# Patient Record
Sex: Female | Born: 1980 | Race: White | Hispanic: No | Marital: Single | Smoking: Former smoker
Health system: Southern US, Community
[De-identification: ages and names within clinical notes are randomized; demographics above are authoritative.]

## PROBLEM LIST (undated history)

## (undated) DIAGNOSIS — E559 Vitamin D deficiency, unspecified: Secondary | ICD-10-CM

## (undated) DIAGNOSIS — F32A Depression, unspecified: Secondary | ICD-10-CM

## (undated) DIAGNOSIS — K625 Hemorrhage of anus and rectum: Secondary | ICD-10-CM

## (undated) DIAGNOSIS — T148XXA Other injury of unspecified body region, initial encounter: Secondary | ICD-10-CM

## (undated) DIAGNOSIS — M2550 Pain in unspecified joint: Secondary | ICD-10-CM

## (undated) DIAGNOSIS — B019 Varicella without complication: Secondary | ICD-10-CM

## (undated) DIAGNOSIS — K209 Esophagitis, unspecified: Secondary | ICD-10-CM

## (undated) DIAGNOSIS — F419 Anxiety disorder, unspecified: Secondary | ICD-10-CM

## (undated) DIAGNOSIS — N920 Excessive and frequent menstruation with regular cycle: Secondary | ICD-10-CM

## (undated) DIAGNOSIS — R296 Repeated falls: Secondary | ICD-10-CM

## (undated) DIAGNOSIS — R5383 Other fatigue: Secondary | ICD-10-CM

## (undated) DIAGNOSIS — R112 Nausea with vomiting, unspecified: Secondary | ICD-10-CM

## (undated) DIAGNOSIS — D649 Anemia, unspecified: Secondary | ICD-10-CM

## (undated) DIAGNOSIS — R51 Headache: Secondary | ICD-10-CM

## (undated) DIAGNOSIS — G43909 Migraine, unspecified, not intractable, without status migrainosus: Secondary | ICD-10-CM

## (undated) DIAGNOSIS — K219 Gastro-esophageal reflux disease without esophagitis: Secondary | ICD-10-CM

## (undated) DIAGNOSIS — R200 Anesthesia of skin: Secondary | ICD-10-CM

## (undated) DIAGNOSIS — Z91018 Allergy to other foods: Secondary | ICD-10-CM

## (undated) DIAGNOSIS — J302 Other seasonal allergic rhinitis: Secondary | ICD-10-CM

## (undated) DIAGNOSIS — F329 Major depressive disorder, single episode, unspecified: Secondary | ICD-10-CM

## (undated) DIAGNOSIS — N390 Urinary tract infection, site not specified: Secondary | ICD-10-CM

## (undated) HISTORY — DX: Esophagitis, unspecified: K20.9

## (undated) HISTORY — DX: Other seasonal allergic rhinitis: J30.2

## (undated) HISTORY — DX: Other injury of unspecified body region, initial encounter: T14.8XXA

## (undated) HISTORY — DX: Pain in unspecified joint: M25.50

## (undated) HISTORY — DX: Vitamin D deficiency, unspecified: E55.9

## (undated) HISTORY — DX: Varicella without complication: B01.9

## (undated) HISTORY — DX: Migraine, unspecified, not intractable, without status migrainosus: G43.909

## (undated) HISTORY — PX: FOOT SURGERY: SHX648

## (undated) HISTORY — DX: Repeated falls: R29.6

## (undated) HISTORY — DX: Headache: R51

## (undated) HISTORY — DX: Urinary tract infection, site not specified: N39.0

## (undated) HISTORY — DX: Nausea with vomiting, unspecified: R11.2

## (undated) HISTORY — DX: Allergy to other foods: Z91.018

## (undated) HISTORY — DX: Hemorrhage of anus and rectum: K62.5

## (undated) HISTORY — DX: Anxiety disorder, unspecified: F41.9

## (undated) HISTORY — DX: Gastro-esophageal reflux disease without esophagitis: K21.9

## (undated) HISTORY — DX: Anesthesia of skin: R20.0

## (undated) HISTORY — DX: Other fatigue: R53.83

---

## 2009-01-25 ENCOUNTER — Encounter: Payer: Self-pay | Admitting: Obstetrics & Gynecology

## 2009-05-17 ENCOUNTER — Encounter: Payer: Self-pay | Admitting: Obstetrics & Gynecology

## 2011-03-11 ENCOUNTER — Other Ambulatory Visit: Payer: Self-pay | Admitting: Family Medicine

## 2011-03-11 ENCOUNTER — Ambulatory Visit
Admission: RE | Admit: 2011-03-11 | Discharge: 2011-03-11 | Disposition: A | Discharge status: Home / Self Care | Payer: BC Managed Care – PPO | Source: Ambulatory Visit | Attending: Family Medicine | Admitting: Family Medicine

## 2011-03-11 DIAGNOSIS — R0981 Nasal congestion: Secondary | ICD-10-CM

## 2011-03-23 ENCOUNTER — Encounter (HOSPITAL_COMMUNITY): Payer: Self-pay | Admitting: Emergency Medicine

## 2011-03-23 ENCOUNTER — Other Ambulatory Visit: Payer: Self-pay

## 2011-03-23 ENCOUNTER — Emergency Department (INDEPENDENT_AMBULATORY_CARE_PROVIDER_SITE_OTHER)
Admission: EM | Admit: 2011-03-23 | Discharge: 2011-03-23 | Disposition: A | Discharge status: Home Health | Payer: BC Managed Care – PPO | Source: Home / Self Care | Attending: Emergency Medicine | Admitting: Emergency Medicine

## 2011-03-23 ENCOUNTER — Emergency Department (INDEPENDENT_AMBULATORY_CARE_PROVIDER_SITE_OTHER): Payer: BC Managed Care – PPO

## 2011-03-23 DIAGNOSIS — K219 Gastro-esophageal reflux disease without esophagitis: Secondary | ICD-10-CM

## 2011-03-23 MED ORDER — SUCRALFATE 1 GM/10ML PO SUSP: 1.0000 g | Freq: Four times a day (QID) | ORAL | Status: DC

## 2011-03-23 MED ORDER — GI COCKTAIL ~~LOC~~
ORAL | Status: AC
  Filled 2011-03-23: qty 30

## 2011-03-23 MED ORDER — GI COCKTAIL ~~LOC~~
30.0000 mL | Freq: Once | ORAL | Status: AC
  Administered 2011-03-23: 30 mL via ORAL

## 2011-03-23 MED ORDER — OMEPRAZOLE 40 MG PO CPDR: 40.0000 mg | DELAYED_RELEASE_CAPSULE | Freq: Two times a day (BID) | ORAL | Status: DC

## 2011-03-23 NOTE — ED Notes (Signed)
PT HERE WITH SUDDEN ONSET OF RIGHT CHEST STABBING,INTERMITT CP THAT STARTED X 3 DYS AGO WITH RADIATION TO R SHOULDER AND BACK.NO SOB OR DISTRESS NOTED.PT ALSO REPORTS OF X 3 EPISODES EVERY NIGHT OF CHEST BURNING WITH ACID REFLUX.NO CARDIAC HX.PT TRIED OTC ADVIL BUT NO RELIEF

## 2011-03-23 NOTE — ED Provider Notes (Signed)
Chief Complaint  Patient presents with  . Chest Pain  . Gastrophageal Reflux    History of Present Illness:   Betty Leonard is a 31 year old female who has had a three-day history of right anterior chest pain. This radiates through the back, down her arm, and into her right flank as well. The pain is worse if she lies down, particularly on her left side and when she lies on her left side she feels like she can't breathe. The pain is sometimes pleuritic. When this first came on she had some fever but none since then. He also over the past couple night she's had nocturnal awakenings with nausea, vomiting, and choking. The pain is burning in quality. It's fairly severe. She denies any coughing, wheezing, or shortness of breath other than as noted above. She denies any palpitations, irregular heartbeat, or dizziness. She's had no history of reflux, heart or lung problems in the past.  Review of Systems:  Other than noted above, the patient denies any of the following symptoms. Systemic:  No fever, chills, sweats, or fatigue. ENT:  No nasal congestion, rhinorrhea, or sore throat. Pulmonary:  No cough, wheezing, shortness of breath, sputum production, hemoptysis. Cardiac:  No palpitations, rapid heartbeat, dizziness, presyncope or syncope. GI:  No abdominal pain, heartburn, nausea, or vomiting. Skin:  No rash or itching. Ext:  No leg pain or swelling.   PMFSH:  Past medical history, family history, social history, meds, and allergies were reviewed and updated as needed.  Physical Exam:   Vital signs:  BP 114/69  Pulse 88  Temp(Src) 97.9 F (36.6 C) (Oral)  Resp 16  SpO2 99%  LMP 03/16/2011 Gen:  Alert, oriented, in no distress, skin warm and dry. Eye:  PERRL, lids and conjunctivas normal.  Sclera non-icteric. ENT:  Mucous membranes moist, pharynx clear. Neck:  Supple, no adenopathy or tenderness.  No JVD. Lungs:  Clear to auscultation, no wheezes, rales or rhonchi.  No respiratory distress. Heart:   Regular rhythm.  No gallops, murmers, clicks or rubs. Chest:  No chest wall tenderness. Abdomen:  Soft, nontender, no organomegaly or mass.  Bowel sounds normal.  No pulsatile abdominal mass or bruit. Ext:  No edema.  No calf tenderness and Homann's sign negative.  Pulses full and equal. Skin:  Warm and dry.  No rash.  Labs:   Results for orders placed during the hospital encounter of 03/23/11  POCT H PYLORI SCREEN      Component Value Range   H. PYLORI SCREEN, POC NEGATIVE  NEGATIVE      Radiology:  Dg Chest 2 View  03/23/2011  *RADIOLOGY REPORT*  Clinical Data: Fever for 3 days.  CHEST - 2 VIEW  Comparison: None.  Findings: Lungs are clear.  Heart size is normal.  No pneumothorax or effusion.  No focal bony abnormality.  IMPRESSION: Negative chest.  Original Report Authenticated By: Bernadene Bell. D'ALESSIO, M.D.    EKG:   Date: 03/23/2011  Rate: 74  Rhythm: normal sinus rhythm  QRS Axis: normal  Intervals: normal  ST/T Wave abnormalities: normal  Conduction Disutrbances:none  Narrative Interpretation: Normal sinus rhythm, normal EKG.  Old EKG Reviewed: none available   Medications given in [facility]:  She was given a dose a GI cocktail with slight relief in her pain.  Assessment:   Diagnoses that have been ruled out:  None  Diagnoses that are still under consideration:  None  Final diagnoses:  GERD (gastroesophageal reflux disease)    Plan:  1.  The following meds were prescribed:   New Prescriptions   OMEPRAZOLE (PRILOSEC) 40 MG CAPSULE    Take 1 capsule (40 mg total) by mouth 2 (two) times daily before a meal.   SUCRALFATE (CARAFATE) 1 GM/10ML SUSPENSION    Take 10 mLs (1 g total) by mouth 4 (four) times daily.   2.  The patient was instructed in symptomatic care and handouts were given. 3.  The patient was told to return if becoming worse in any way, if no better in 3 or 4 days, and given some red flag symptoms that would indicate earlier return.  Follow up:  The patient  was told to follow up with her primary care doctor in one week.     Roque Lias, MD 03/23/11 1059

## 2011-03-23 NOTE — Discharge Instructions (Signed)
Diet for GERD or PUD Nutrition therapy can help ease the discomfort of gastroesophageal reflux disease (GERD) and peptic ulcer disease (PUD).  HOME CARE INSTRUCTIONS   Eat your meals slowly, in a relaxed setting.   Eat 5 to 6 small meals per day.   If a food causes distress, stop eating it for a period of time.  FOODS TO AVOID  Coffee, regular or decaffeinated.   Cola beverages, regular or low calorie.   Tea, regular or decaffeinated.   Pepper.   Cocoa.   High fat foods, including meats.   Butter, margarine, hydrogenated oil (trans fats).   Peppermint or spearmint (if you have GERD).   Fruits and vegetables if not tolerated.   Alcohol.   Nicotine (smoking or chewing). This is one of the most potent stimulants to acid production in the gastrointestinal tract.   Any food that seems to aggravate your condition.  If you have questions regarding your diet, ask your caregiver or a registered dietitian. TIPS  Lying flat may make symptoms worse. Keep the head of your bed raised 6 to 9 inches (15 to 23 cm) by using a foam wedge or blocks under the legs of the bed.   Do not lay down until 3 hours after eating a meal.   Daily physical activity may help reduce symptoms.  MAKE SURE YOU:   Understand these instructions.   Will watch your condition.   Will get help right away if you are not doing well or get worse.  Document Released: 01/05/2005 Document Revised: 12/25/2010 Document Reviewed: 11/21/2010 [employer] Patient Information 2012 [employer]. 

## 2011-04-01 ENCOUNTER — Encounter (HOSPITAL_COMMUNITY): Payer: Self-pay | Admitting: *Deleted

## 2011-04-01 ENCOUNTER — Emergency Department (INDEPENDENT_AMBULATORY_CARE_PROVIDER_SITE_OTHER)
Admission: EM | Admit: 2011-04-01 | Discharge: 2011-04-01 | Disposition: A | Discharge status: Home / Self Care | Payer: BC Managed Care – PPO | Source: Home / Self Care | Attending: Family Medicine | Admitting: Family Medicine

## 2011-04-01 ENCOUNTER — Emergency Department (INDEPENDENT_AMBULATORY_CARE_PROVIDER_SITE_OTHER): Payer: BC Managed Care – PPO

## 2011-04-01 DIAGNOSIS — IMO0001 Reserved for inherently not codable concepts without codable children: Secondary | ICD-10-CM

## 2011-04-01 DIAGNOSIS — W319XXA Contact with unspecified machinery, initial encounter: Secondary | ICD-10-CM

## 2011-04-01 DIAGNOSIS — S6390XA Sprain of unspecified part of unspecified wrist and hand, initial encounter: Secondary | ICD-10-CM

## 2011-04-01 DIAGNOSIS — Y93G3 Activity, cooking and baking: Secondary | ICD-10-CM

## 2011-04-01 MED ORDER — IBUPROFEN 600 MG PO TABS: 600.0000 mg | ORAL_TABLET | Freq: Three times a day (TID) | ORAL | Status: AC | PRN

## 2011-04-01 NOTE — Discharge Instructions (Signed)
There are no fractures or bone injury signs and your x-ray. Take anti-inflammatory medication prescribed as instructed. Keep the splint in place on until pain and swelling are completely resolved, removed the splint at least 3 times a day for rehabilitation exercises squeezing a rubber ball once pain is improved. Followup with a hand specialist if worsening symptoms like increased swelling pain or difficulty with movement.   Finger Sprain A finger sprain is a tear in one of the strong, fibrous tissues that connect the bones (ligaments) in your finger. The severity of the sprain depends on how much of the ligament is torn. The tear can be either partial or complete. CAUSES  Often, sprains are a result of a fall or accident. If you extend your hands to catch an object or to protect yourself, the force of the impact causes the fibers of your ligament to stretch too much. This excess tension causes the fibers of your ligament to tear. SYMPTOMS  You may have some loss of motion in your finger. Other symptoms include:  Bruising.   Tenderness.   Swelling.  DIAGNOSIS  In order to diagnose finger sprain, your caregiver will physically examine your finger or thumb to determine how torn the ligament is. Your caregiver may also suggest an X-ray exam of your finger to make sure no bones are broken. TREATMENT  If your ligament is only partially torn, treatment usually involves keeping the finger in a fixed position (immobilization) for a short period. To do this, your caregiver will apply a bandage, cast, or splint to keep your finger from moving until it heals. For a partially torn ligament, the healing process usually takes 2 to 3 weeks. If your ligament is completely torn, you may need surgery to reconnect the ligament to the bone. After surgery a cast or splint will be applied and will need to stay on your finger or thumb for 4 to 6 weeks while your ligament heals. HOME CARE INSTRUCTIONS  Keep your  injured finger elevated, when possible, to decrease swelling.   To ease pain and swelling, apply ice to your joint twice a day, for 2 to 3 days:   Put ice in a plastic bag.   Place a towel between your skin and the bag.   Leave the ice on for 15 minutes.   Only take over-the-counter or prescription medicine for pain as directed by your caregiver.   Do not wear rings on your injured finger.   Do not leave your finger unprotected until pain and stiffness go away (usually 3 to 4 weeks).   Do not allow your cast or splint to get wet. Cover your cast or splint with a plastic bag when you shower or bathe. Do not swim.   Your caregiver may suggest special exercises for you to do during your recovery to prevent or limit permanent stiffness.  SEEK IMMEDIATE MEDICAL CARE IF:  Your cast or splint becomes damaged.   Your pain becomes worse rather than better.  MAKE SURE YOU:  Understand these instructions.   Will watch your condition.   Will get help right away if you are not doing well or get worse.  Document Released: 02/13/2004 Document Revised: 12/25/2010 Document Reviewed: 09/08/2010 [facility] Patient Information 2012 [place], [place].

## 2011-04-01 NOTE — ED Provider Notes (Signed)
History     CSN: [id number]  Arrival date & time 04/01/11  1753   First MD Initiated Contact with Patient 04/01/11 1827      Chief Complaint  Patient presents with  . Hand Pain    (Consider location/radiation/quality/duration/timing/severity/associated sxs/prior treatment) HPI Comments: 31 y/o left handed female here c/o pain and swelling of right hand specially of the 4th digit after her hand got caught in a food processor while mixing a cake. No lacerations, also reports diffused soreness in forearm. Denies pain in wrist.    History reviewed. No pertinent past medical history.  Past Surgical History  Procedure Date  . Foot surgery     History reviewed. No pertinent family history.  History  Substance Use Topics  . Smoking status: Current Everyday Smoker  . Smokeless tobacco: Not on file  . Alcohol Use: Yes    OB History    Grav Para Term Preterm Abortions TAB SAB Ect Mult Living                  Review of Systems  All other systems reviewed and are negative.    Allergies  Aspirin and Codeine  Home Medications   Current Outpatient Rx  Name Route Sig Dispense Refill  . AMITRIPTYLINE HCL 10 MG PO TABS Oral Take 10 mg by mouth at bedtime.    Marland Kitchen CITALOPRAM HYDROBROMIDE 40 MG PO TABS Oral Take 40 mg by mouth daily.    . IBUPROFEN 600 MG PO TABS Oral Take 1 tablet (600 mg total) by mouth every 8 (eight) hours as needed for pain. 30 tablet 0  . LEVONORGEST-ETH ESTRAD 91-DAY 0.15-0.03 MG PO TABS Oral Take 1 tablet by mouth daily.    Marland Kitchen OMEPRAZOLE 40 MG PO CPDR Oral Take 1 capsule (40 mg total) by mouth 2 (two) times daily before a meal. 30 capsule 0  . SUCRALFATE 1 GM/10ML PO SUSP Oral Take 10 mLs (1 g total) by mouth 4 (four) times daily. 420 mL 0    BP 118/70  Pulse 78  Temp(Src) 98.6 F (37 C) (Oral)  Resp 18  SpO2 100%  LMP 04/01/2011  Physical Exam  Nursing note and vitals reviewed. Constitutional: She is oriented to person, place, and time. She  appears well-developed and well-nourished. No distress.  HENT:  Head: Normocephalic and atraumatic.  Cardiovascular: Normal heart sounds.   Pulmonary/Chest: Breath sounds normal.  Musculoskeletal: She exhibits edema and tenderness.       Right hand: mild to moderate swelling and tenderness over dorsum of right 4th PIPJ. Patient able to partially flex and extend this joint with limitation due to pain and swelling. Able to flex and extend distal IPJ. Intact sensation of the entire right hand including 4th digit. No skin discoloration, bruising or hematomas.  Right wrist: FROM normal exam. Right fore arm diffused tenderness medially worse with active supination otherwise normal exam.  Neurological: She is alert and oriented to person, place, and time. She has normal reflexes.    ED Course  Procedures (including critical care time)  Labs Reviewed - No data to display Dg Hand Complete Right  04/01/2011  *RADIOLOGY REPORT*  Clinical Data: hand stuck in blender  RIGHT HAND - COMPLETE 3+ VIEW  Comparison: None.  Findings: Negative for fracture, dislocation, or other acute abnormality.  Normal alignment and mineralization. No significant degenerative change.  Regional soft tissues unremarkable.  IMPRESSION:  Negative  Original Report Authenticated By: Osa Craver, M.D.  1. Sprain of fourth finger of right hand       MDM  Possible mild misalignment noted on X-rays at level of right 4th PIPJ not described by radiologist, but impress mainly at the expense of soft tissue as articular faces look well confronted. Decided to place on digit splint. Asked to remove 3times a day for rehab exercises and return or follow up with hand specialist if persistent pain or swelling after 5-7 days or earlier if any worsening of her symptoms.         Sharin Grave, MD 04/02/11 1318

## 2011-04-01 NOTE — ED Notes (Signed)
Pt  Reports  She  Put her  r  Hand  In a  Mixer  Today    And  inj  Her  Hand she  Has  Pain on palpation

## 2011-09-23 ENCOUNTER — Emergency Department (HOSPITAL_COMMUNITY)
Admission: EM | Admit: 2011-09-23 | Discharge: 2011-09-23 | Disposition: A | Discharge status: Home / Self Care | Payer: BC Managed Care – PPO | Source: Home / Self Care | Attending: Emergency Medicine | Admitting: Emergency Medicine

## 2011-09-23 ENCOUNTER — Encounter (HOSPITAL_COMMUNITY): Payer: Self-pay | Admitting: *Deleted

## 2011-09-23 ENCOUNTER — Emergency Department (INDEPENDENT_AMBULATORY_CARE_PROVIDER_SITE_OTHER): Payer: BC Managed Care – PPO

## 2011-09-23 DIAGNOSIS — M7660 Achilles tendinitis, unspecified leg: Secondary | ICD-10-CM

## 2011-09-23 NOTE — ED Provider Notes (Signed)
History     CSN: [id number]  Arrival date & time 09/23/11  4098   First MD Initiated Contact with Patient 09/23/11 1839      Chief Complaint  Patient presents with  . Ankle Pain    (Consider location/radiation/quality/duration/timing/severity/associated sxs/prior treatment) HPI Comments: Pain below R medial malleolus and at achilles and lower calf area ~ 1 week.  Does not recall any injury.  Hurts to dorsiflex foot and to stand on tip toes.  The history is provided by the patient. No language interpreter was used.    History reviewed. No pertinent past medical history.  Past Surgical History  Procedure Date  . Foot surgery     History reviewed. No pertinent family history.  History  Substance Use Topics  . Smoking status: Current Everyday Smoker  . Smokeless tobacco: Not on file  . Alcohol Use: Yes    OB History    Grav Para Term Preterm Abortions TAB SAB Ect Mult Living                  Review of Systems  Musculoskeletal:       R ankle pain  All other systems reviewed and are negative.    Allergies  Aspirin and Codeine  Home Medications   Current Outpatient Rx  Name Route Sig Dispense Refill  . AMITRIPTYLINE HCL 10 MG PO TABS Oral Take 10 mg by mouth at bedtime.    Marland Kitchen CITALOPRAM HYDROBROMIDE 40 MG PO TABS Oral Take 40 mg by mouth daily.    Marland Kitchen LEVONORGEST-ETH ESTRAD 91-DAY 0.15-0.03 MG PO TABS Oral Take 1 tablet by mouth daily.    Marland Kitchen OMEPRAZOLE 40 MG PO CPDR Oral Take 1 capsule (40 mg total) by mouth 2 (two) times daily before a meal. 30 capsule 0  . SUCRALFATE 1 GM/10ML PO SUSP Oral Take 10 mLs (1 g total) by mouth 4 (four) times daily. 420 mL 0    BP 122/68  Pulse 72  Temp 98.6 F (37 C)  Resp 14  SpO2 100%  LMP 09/03/2011  Physical Exam  ED Course  Procedures (including critical care time)  Labs Reviewed - No data to display Dg Ankle Complete Right  09/23/2011  *RADIOLOGY REPORT*  Clinical Data: Pain  RIGHT ANKLE - COMPLETE 3+ VIEW   Comparison: None.  Findings: Three-view exam of the right ankle shows no evidence for fracture.  No subluxation or dislocation.  No worrisome lytic or sclerotic osseous abnormality.  Surgical hardware is seen in the base of the first metatarsal and in the distal second and third metatarsals although these areas are incompletely visualized.  IMPRESSION: No acute bony findings in the ankle.   Original Report Authenticated By: ERIC A. MANSELL, M.D.      1. Achilles tendonitis       MDM  ASO splint x 2-3 weeks. Ice F/u with dr. Luiz Blare prn         Evalina Field, PA 09/23/11 2106

## 2011-09-23 NOTE — ED Notes (Signed)
Medium  aso  Applied

## 2011-09-23 NOTE — ED Notes (Signed)
pT  REPORTS     SHE  HAS  PAIN R  ANKLE AREA  FOR  WHICH  SHE  REPORTS  SHE  NOTICED   ABOUT  1  WEEK  AGO  SHE  DENYS  ANY  SPECEFIC    INJURY  SHE  AMBULATED     TO  ROOM  WITH A  SLOW  STEADY  GAIT       NO  OBVIOUS  DEFORMITY  NOTED          SHE  REPORTS  SHE  HAS  BEEN  APPLYING       ICE  TO THE  AREA  LOCALLY

## 2011-09-23 NOTE — ED Provider Notes (Signed)
Medical screening examination/treatment/procedure(s) were performed by non-physician practitioner and as supervising physician I was immediately available for consultation/collaboration.  Raynald Blend, MD 09/23/11 2117

## 2011-10-22 ENCOUNTER — Other Ambulatory Visit: Payer: Self-pay | Admitting: Sports Medicine

## 2011-10-22 DIAGNOSIS — M25571 Pain in right ankle and joints of right foot: Secondary | ICD-10-CM

## 2011-10-25 ENCOUNTER — Ambulatory Visit
Admission: RE | Admit: 2011-10-25 | Discharge: 2011-10-25 | Disposition: A | Discharge status: Home / Self Care | Payer: BC Managed Care – PPO | Source: Ambulatory Visit | Attending: Sports Medicine | Admitting: Sports Medicine

## 2011-10-25 DIAGNOSIS — M25571 Pain in right ankle and joints of right foot: Secondary | ICD-10-CM

## 2011-12-16 ENCOUNTER — Encounter: Payer: Self-pay | Admitting: Gastroenterology

## 2011-12-16 ENCOUNTER — Ambulatory Visit (INDEPENDENT_AMBULATORY_CARE_PROVIDER_SITE_OTHER): Payer: BC Managed Care – PPO | Admitting: Gastroenterology

## 2011-12-16 VITALS — BP 120/64 | HR 96 | Ht 66.0 in | Wt 216.0 lb

## 2011-12-16 DIAGNOSIS — K625 Hemorrhage of anus and rectum: Secondary | ICD-10-CM

## 2011-12-16 DIAGNOSIS — R112 Nausea with vomiting, unspecified: Secondary | ICD-10-CM

## 2011-12-16 NOTE — Progress Notes (Signed)
History of Present Illness:  31 year-old white female referred at the request of Dr. Fulp for evaluation of nausea and vomiting.  For the past 5 days she's had persistent nausea and immediate postprandial vomiting. Nausea tends to occur throughout the day. She is without fever, myalgias or change in bowel habits. It is similar episode about a year ago and she was placed on Prilosec. She's taking 80 mg twice a day. On 2 occasions she seen a minimal amount of blood streaking her stools. Stools have been solid.  She denies urinary frequency or dysuria.    Past Medical History  Diagnosis Date  . GERD (gastroesophageal reflux disease)   . Migraines    Past Surgical History  Procedure Date  . Foot surgery    family history includes Leukemia in her paternal grandfather and Lung cancer in her paternal grandfather.  There is no history of Colon cancer. Current Outpatient Prescriptions  Medication Sig Dispense Refill  . baclofen (LIORESAL) 10 MG tablet Takes as directed      . citalopram (CELEXA) 40 MG tablet Take 40 mg by mouth daily.      . omeprazole (PRILOSEC) 40 MG capsule Take by mouth. Two capsules twice a day      . topiramate (TOPAMAX) 25 MG tablet Takes as directed      . [DISCONTINUED] omeprazole (PRILOSEC) 40 MG capsule Take 1 capsule (40 mg total) by mouth 2 (two) times daily before a meal.  30 capsule  0   Allergies as of 12/16/2011 - Review Complete 12/16/2011  Allergen Reaction Noted  . Aspirin  03/23/2011  . Codeine  03/23/2011    reports that she has been smoking.  She has never used smokeless tobacco. She reports that she drinks alcohol. She reports that she does not use illicit drugs.     Review of Systems: Pertinent positive and negative review of systems were noted in the above HPI section. All other review of systems were otherwise negative.  Vital signs were reviewed in today's medical record Physical Exam: General: Well developed , well nourished, no acute  distress Head: Normocephalic and atraumatic Eyes:  sclerae anicteric, EOMI Ears: Normal auditory acuity Mouth: No deformity or lesions Neck: Supple, no masses or thyromegaly Lungs: Clear throughout to auscultation Heart: Regular rate and rhythm; no murmurs, rubs or bruits Abdomen: Soft, non tender and non distended. No masses, hepatosplenomegaly or hernias noted. Normal Bowel sounds. There is no succussion splash.  There is minimal right CVA tenderness. Rectal:deferred Musculoskeletal: Symmetrical with no gross deformities  Skin: No lesions on visible extremities Pulses:  Normal pulses noted Extremities: No clubbing, cyanosis, edema or deformities noted Neurological: Alert oriented x 4, grossly nonfocal Cervical Nodes:  No significant cervical adenopathy Inguinal Nodes: No significant inguinal adenopathy Psychological:  Alert and cooperative. Normal mood and affect       

## 2011-12-16 NOTE — Assessment & Plan Note (Signed)
Very minimal rectal bleeding is probably hemorrhoidal. Plan no further workup unless symptoms recur at which point I would consider sigmoidoscopy or colonoscopy.

## 2011-12-16 NOTE — Assessment & Plan Note (Addendum)
Symptoms may be due to high-dose PPI therapy.  This would be a prolonged episode were it gastroenteritis. Gastroparesis and active peptic ulcer disease are less likely possibilities.  Recommendations #1 discontinue Prilosec. Patient was carefully instructed to contact me if symptoms persist after 4-5 days at which point I would consider upper endoscopy

## 2011-12-16 NOTE — Patient Instructions (Addendum)
Discontinue Prilosec Call back in 5 days if no better

## 2011-12-21 ENCOUNTER — Other Ambulatory Visit: Payer: Self-pay | Admitting: Gastroenterology

## 2011-12-21 ENCOUNTER — Telehealth: Payer: Self-pay | Admitting: Gastroenterology

## 2011-12-21 DIAGNOSIS — R112 Nausea with vomiting, unspecified: Secondary | ICD-10-CM

## 2011-12-21 NOTE — Telephone Encounter (Signed)
ok 

## 2011-12-21 NOTE — Telephone Encounter (Signed)
Pt states she is not feeling any better, she is still vomiting. Should pt be scheduled for EGD as per OV note? Please advise.

## 2011-12-21 NOTE — Telephone Encounter (Signed)
Schedule EGD

## 2011-12-21 NOTE — Telephone Encounter (Signed)
Pt scheduled for EGD at [facility] 12/24/11 @12 :30pm. Pt to arrive at 11:30am. Pt aware of appt date and time. Prep instructions sent to pt.

## 2011-12-24 ENCOUNTER — Encounter (HOSPITAL_COMMUNITY)
Admission: RE | Disposition: A | Discharge status: Home / Self Care | Payer: Self-pay | Source: Ambulatory Visit | Attending: Gastroenterology

## 2011-12-24 ENCOUNTER — Ambulatory Visit (HOSPITAL_COMMUNITY)
Admission: RE | Admit: 2011-12-24 | Discharge: 2011-12-24 | Disposition: A | Discharge status: Home / Self Care | Payer: BC Managed Care – PPO | Source: Ambulatory Visit | Attending: Gastroenterology | Admitting: Gastroenterology

## 2011-12-24 ENCOUNTER — Encounter (HOSPITAL_COMMUNITY): Payer: Self-pay | Admitting: *Deleted

## 2011-12-24 DIAGNOSIS — K209 Esophagitis, unspecified without bleeding: Secondary | ICD-10-CM

## 2011-12-24 DIAGNOSIS — R112 Nausea with vomiting, unspecified: Secondary | ICD-10-CM | POA: Insufficient documentation

## 2011-12-24 DIAGNOSIS — D131 Benign neoplasm of stomach: Secondary | ICD-10-CM | POA: Insufficient documentation

## 2011-12-24 HISTORY — PX: ESOPHAGOGASTRODUODENOSCOPY: SHX5428

## 2011-12-24 HISTORY — DX: Esophagitis, unspecified without bleeding: K20.90

## 2011-12-24 HISTORY — DX: Major depressive disorder, single episode, unspecified: F32.9

## 2011-12-24 HISTORY — DX: Depression, unspecified: F32.A

## 2011-12-24 SURGERY — EGD (ESOPHAGOGASTRODUODENOSCOPY)
Anesthesia: Moderate Sedation

## 2011-12-24 MED ORDER — FENTANYL CITRATE 0.05 MG/ML IJ SOLN
INTRAMUSCULAR | Status: DC | PRN
  Administered 2011-12-24 (×4): 25 ug via INTRAVENOUS

## 2011-12-24 MED ORDER — GLYCOPYRROLATE 0.2 MG/ML IJ SOLN
INTRAMUSCULAR | Status: DC | PRN
  Administered 2011-12-24: 0.2 mg via INTRAVENOUS

## 2011-12-24 MED ORDER — SODIUM CHLORIDE 0.9 % IV SOLN
INTRAVENOUS | Status: DC
  Administered 2011-12-24: 500 mL via INTRAVENOUS

## 2011-12-24 MED ORDER — GLYCOPYRROLATE 0.2 MG/ML IJ SOLN
INTRAMUSCULAR | Status: AC
  Filled 2011-12-24: qty 1

## 2011-12-24 MED ORDER — FENTANYL CITRATE 0.05 MG/ML IJ SOLN
INTRAMUSCULAR | Status: AC
  Filled 2011-12-24: qty 4

## 2011-12-24 MED ORDER — DIPHENHYDRAMINE HCL 50 MG/ML IJ SOLN
INTRAMUSCULAR | Status: AC
  Filled 2011-12-24: qty 1

## 2011-12-24 MED ORDER — DIPHENHYDRAMINE HCL 50 MG/ML IJ SOLN
INTRAMUSCULAR | Status: DC | PRN
  Administered 2011-12-24: 25 mg via INTRAVENOUS

## 2011-12-24 MED ORDER — MIDAZOLAM HCL 10 MG/2ML IJ SOLN
INTRAMUSCULAR | Status: DC | PRN
  Administered 2011-12-24 (×4): 2.5 mg via INTRAVENOUS

## 2011-12-24 MED ORDER — MIDAZOLAM HCL 10 MG/2ML IJ SOLN
INTRAMUSCULAR | Status: AC
  Filled 2011-12-24: qty 4

## 2011-12-24 NOTE — Interval H&P Note (Signed)
History and Physical Interval Note:  12/24/2011 12:19 PM  Betty Leonard  has presented today for surgery, with the diagnosis of Nausea & vomiting [787.01]  The various methods of treatment have been discussed with the patient and family. After consideration of risks, benefits and other options for treatment, the patient has consented to  Procedure(s) (LRB) with comments: ESOPHAGOGASTRODUODENOSCOPY (EGD) (N/A) as a surgical intervention .  The patient's history has been reviewed, patient examined, no change in status, stable for surgery.  I have reviewed the patient's chart and labs.  Questions were answered to the patient's satisfaction.    The recent H&P (dated *12/16/11**) was reviewed, the patient was examined and there is no change in the patients condition since that H&P was completed.   Melvia Heaps  12/24/2011, 12:19 PM    Melvia Heaps

## 2011-12-24 NOTE — H&P (View-Only) (Signed)
History of Present Illness:  31 year-old white female referred at the request of Dr. Jillyn Hidden for evaluation of nausea and vomiting.  For the past 5 days she's had persistent nausea and immediate postprandial vomiting. Nausea tends to occur throughout the day. She is without fever, myalgias or change in bowel habits. It is similar episode about a year ago and she was placed on Prilosec. She's taking 80 mg twice a day. On 2 occasions she seen a minimal amount of blood streaking her stools. Stools have been solid.  She denies urinary frequency or dysuria.    Past Medical History  Diagnosis Date  . GERD (gastroesophageal reflux disease)   . Migraines    Past Surgical History  Procedure Date  . Foot surgery    family history includes Leukemia in her paternal grandfather and Lung cancer in her paternal grandfather.  There is no history of Colon cancer. Current Outpatient Prescriptions  Medication Sig Dispense Refill  . baclofen (LIORESAL) 10 MG tablet Takes as directed      . citalopram (CELEXA) 40 MG tablet Take 40 mg by mouth daily.      Marland Kitchen omeprazole (PRILOSEC) 40 MG capsule Take by mouth. Two capsules twice a day      . topiramate (TOPAMAX) 25 MG tablet Takes as directed      . [DISCONTINUED] omeprazole (PRILOSEC) 40 MG capsule Take 1 capsule (40 mg total) by mouth 2 (two) times daily before a meal.  30 capsule  0   Allergies as of 12/16/2011 - Review Complete 12/16/2011  Allergen Reaction Noted  . Aspirin  03/23/2011  . Codeine  03/23/2011    reports that she has been smoking.  She has never used smokeless tobacco. She reports that she drinks alcohol. She reports that she does not use illicit drugs.     Review of Systems: Pertinent positive and negative review of systems were noted in the above HPI section. All other review of systems were otherwise negative.  Vital signs were reviewed in today's medical record Physical Exam: General: Well developed , well nourished, no acute  distress Head: Normocephalic and atraumatic Eyes:  sclerae anicteric, EOMI Ears: Normal auditory acuity Mouth: No deformity or lesions Neck: Supple, no masses or thyromegaly Lungs: Clear throughout to auscultation Heart: Regular rate and rhythm; no murmurs, rubs or bruits Abdomen: Soft, non tender and non distended. No masses, hepatosplenomegaly or hernias noted. Normal Bowel sounds. There is no succussion splash.  There is minimal right CVA tenderness. Rectal:deferred Musculoskeletal: Symmetrical with no gross deformities  Skin: No lesions on visible extremities Pulses:  Normal pulses noted Extremities: No clubbing, cyanosis, edema or deformities noted Neurological: Alert oriented x 4, grossly nonfocal Cervical Nodes:  No significant cervical adenopathy Inguinal Nodes: No significant inguinal adenopathy Psychological:  Alert and cooperative. Normal mood and affect

## 2011-12-24 NOTE — Op Note (Signed)
[facility] [address] [place], [address]   ENDOSCOPY PROCEDURE REPORT  PATIENT: Betty Leonard, Betty Leonard  MR#: [id number] BIRTHDATE: 09-22-1980 , 31  yrs. old GENDER: Female ENDOSCOPIST: Louis Meckel, MD REFERRED BY: PROCEDURE DATE:  12/24/2011 PROCEDURE:  EGD, diagnostic ASA CLASS:     Class I INDICATIONS:  Nausea.   Vomiting. MEDICATIONS: These medications were titrated to patient response per physician's verbal order, Versed 10 mg IV, Fentanyl 100 mcg IV, Benadryl 25 mg IV, and Robinul 0.2 mg IV TOPICAL ANESTHETIC: Lidocaine Spray  DESCRIPTION OF PROCEDURE: After the risks benefits and alternatives of the procedure were thoroughly explained, informed consent was obtained.  The Pentax Gastroscope [id number] endoscope was introduced through the mouth and advanced to the   . Without limitations.  The instrument was slowly withdrawn as the mucosa was fully examined.      There were a few linear erosions at the GE junction.  There were 3 1-2 mm fundic gland polyps in the gastric fundus characterized by normal overlying mucosa located in the gastric fundus. The remainder of the upper endoscopy exam was otherwise normal. Retroflexed views revealed no abnormalities.     The scope was then withdrawn from the patient and the procedure completed.  COMPLICATIONS: There were no complications. ENDOSCOPIC IMPRESSION: 1.  erosive esophagitis 2.  gastric polyps   RECOMMENDATIONS: gastric emptying scan continue PPI therapy REPEAT EXAM:  eSigned:  Louis Meckel, MD 12/24/2011 12:50 PM   CC:

## 2011-12-24 NOTE — Discharge Instructions (Signed)
My office will contact you to schedule gastric emptying scan.  Gastrointestinal Endoscopy Care After Refer to this sheet in the next few weeks. These instructions provide you with information on caring for yourself after your procedure. Your caregiver may also give you more specific instructions. Your treatment has been planned according to current medical practices, but problems sometimes occur. Call your caregiver if you have any problems or questions after your procedure. HOME CARE INSTRUCTIONS  If you were given medicine to help you relax (sedative), do not drive, operate machinery, or sign important documents for 24 hours.  Avoid alcohol and hot or warm beverages for the first 24 hours after the procedure.  Only take over-the-counter or prescription medicines for pain, discomfort, or fever as directed by your caregiver. You may resume taking your normal medicines unless your caregiver tells you otherwise. Ask your caregiver when you may resume taking medicines that may cause bleeding, such as aspirin, clopidogrel, or warfarin.  You may return to your normal diet and activities on the day after your procedure, or as directed by your caregiver. Walking may help to reduce any bloated feeling in your abdomen.  Drink enough fluids to keep your urine clear or pale yellow.  You may gargle with salt water if you have a sore throat. SEEK IMMEDIATE MEDICAL CARE IF:  You have severe nausea or vomiting.  You have severe abdominal pain, abdominal cramps that last longer than 6 hours, or abdominal swelling (distention).  You have severe shoulder or back pain.  You have trouble swallowing.  You have shortness of breath, your breathing is shallow, or you are breathing faster than normal.  You have a fever or a rapid heartbeat.  You vomit blood or material that looks like coffee grounds.  You have bloody, black, or tarry stools. MAKE SURE YOU:  Understand these instructions.  Will watch  your condition.  Will get help right away if you are not doing well or get worse. Document Released: 08/20/2003 Document Revised: 07/07/2011 Document Reviewed: 04/07/2011 [facility] Patient Information 2013 [place], [place].

## 2011-12-25 ENCOUNTER — Encounter (HOSPITAL_COMMUNITY): Payer: Self-pay | Admitting: Gastroenterology

## 2011-12-25 ENCOUNTER — Telehealth: Payer: Self-pay

## 2011-12-25 ENCOUNTER — Other Ambulatory Visit: Payer: Self-pay | Admitting: Gastroenterology

## 2011-12-25 DIAGNOSIS — R109 Unspecified abdominal pain: Secondary | ICD-10-CM

## 2011-12-25 NOTE — Telephone Encounter (Signed)
Pt scheduled for GES at [facility] 01/25/12 pt to arrive at 7:45am for an 8am appt. Pt to be NPO after midnight. Pt states this day is not good for her. Pt given the number [phone] to call and reschedule the appt to a date that works for her. Pt verbalized understanding.

## 2011-12-31 ENCOUNTER — Telehealth: Payer: Self-pay | Admitting: Gastroenterology

## 2011-12-31 NOTE — Telephone Encounter (Signed)
Pt c/o very sharp pain that comes and goes on her back side around the kidney area. Discussed with pt that is sounds like she may have a kidney stone. Advised pt to call her PCP for this, she verbalized understanding.

## 2012-01-06 ENCOUNTER — Encounter (HOSPITAL_COMMUNITY)
Admission: RE | Admit: 2012-01-06 | Discharge: 2012-01-06 | Disposition: A | Discharge status: Home / Self Care | Payer: BC Managed Care – PPO | Source: Ambulatory Visit | Attending: Gastroenterology | Admitting: Gastroenterology

## 2012-01-06 DIAGNOSIS — R109 Unspecified abdominal pain: Secondary | ICD-10-CM

## 2012-01-06 MED ORDER — TECHNETIUM TC 99M SULFUR COLLOID
2.0000 | Freq: Once | INTRAVENOUS | Status: AC | PRN
  Administered 2012-01-06: 2 via INTRAVENOUS

## 2012-01-07 ENCOUNTER — Telehealth: Payer: Self-pay | Admitting: Gastroenterology

## 2012-01-07 NOTE — Telephone Encounter (Signed)
Pt called wanting to know if she needs to schedule a F/U OV? Please advise.

## 2012-01-11 NOTE — Telephone Encounter (Signed)
Pt scheduled for an OV with Dr. Arlyce Dice and she is aware.

## 2012-01-11 NOTE — Telephone Encounter (Signed)
Left message for pt to call back  °

## 2012-01-11 NOTE — Telephone Encounter (Signed)
yes

## 2012-01-25 ENCOUNTER — Other Ambulatory Visit (HOSPITAL_COMMUNITY): Payer: BC Managed Care – PPO

## 2012-01-27 ENCOUNTER — Ambulatory Visit (INDEPENDENT_AMBULATORY_CARE_PROVIDER_SITE_OTHER): Payer: BC Managed Care – PPO | Admitting: Gastroenterology

## 2012-01-27 ENCOUNTER — Encounter: Payer: Self-pay | Admitting: Gastroenterology

## 2012-01-27 VITALS — BP 110/68 | HR 84 | Ht 66.0 in | Wt 216.2 lb

## 2012-01-27 DIAGNOSIS — R112 Nausea with vomiting, unspecified: Secondary | ICD-10-CM

## 2012-01-27 MED ORDER — METOCLOPRAMIDE HCL 10 MG PO TABS: ORAL_TABLET | ORAL | Status: DC

## 2012-01-27 NOTE — Patient Instructions (Addendum)
We are sending in a prescription to your pharmacy Call back in one week to report your progress

## 2012-01-27 NOTE — Progress Notes (Signed)
History of Present Illness:  Betty Leonard has returned for followup of nausea and vomiting. She continues to complain of postprandial vomiting. Within 30 minutes of eating she will develop a fullness with the sudden urge to vomit. She denies severe nausea, per se. If she limits her diet to liquids and coffee this does not occur. She denies abdominal pain. Weight has been stable. Upper endoscopy demonstrated mild nonerosive esophagitis. Gastric emptying scan was normal. She denies change of bowel habits. She's been on a stable regimen of medications for over a year. She has no antecedent GI complaints. There's no history of abdominal surgery.    Review of Systems: Pertinent positive and negative review of systems were noted in the above HPI section. All other review of systems were otherwise negative.    Current Medications, Allergies, Past Medical History, Past Surgical History, Family History and Social History were reviewed in [employer] [facility] electronic medical record  Vital signs were reviewed in today's medical record. Physical Exam: General: Well developed , well nourished, no acute distress Abdomen is without masses, tenderness organomegaly. There is no succussion splash

## 2012-01-27 NOTE — Assessment & Plan Note (Signed)
She continues with postprandial vomiting. She is not nauseated so much as she has the sudden onset of fullness that eventuates with vomiting. Despite her symptoms weight has been stable.  Medications  long preceded these symptoms. Functional complaints is a consideration. Doubt chronic cholecystitis. Intestinal obstruction is also less likely.  Recommendations #1 trial of Reglan 10 mg one of our a.c. and at bedtime  Instructed patient to  contact me immediately  if she develops any side effects from her Reglan including paresthesias, tremors, confusion , weakness or muscle spasms.  It is noted that metoclopramide may increase the risk of serotonin syndrome.  Patient was instructed to call back in one week to report her progress. If not improved would consider an upper GI series

## 2012-03-05 ENCOUNTER — Other Ambulatory Visit: Payer: Self-pay

## 2012-07-28 ENCOUNTER — Ambulatory Visit (INDEPENDENT_AMBULATORY_CARE_PROVIDER_SITE_OTHER): Payer: BC Managed Care – PPO | Admitting: Family Medicine

## 2012-07-28 ENCOUNTER — Telehealth: Payer: Self-pay | Admitting: Family Medicine

## 2012-07-28 ENCOUNTER — Encounter: Payer: Self-pay | Admitting: Family Medicine

## 2012-07-28 VITALS — BP 130/80 | Temp 97.9°F | Ht 66.0 in | Wt 212.0 lb

## 2012-07-28 DIAGNOSIS — Z7689 Persons encountering health services in other specified circumstances: Secondary | ICD-10-CM

## 2012-07-28 DIAGNOSIS — M722 Plantar fascial fibromatosis: Secondary | ICD-10-CM

## 2012-07-28 DIAGNOSIS — F341 Dysthymic disorder: Secondary | ICD-10-CM

## 2012-07-28 DIAGNOSIS — M76829 Posterior tibial tendinitis, unspecified leg: Secondary | ICD-10-CM

## 2012-07-28 DIAGNOSIS — F419 Anxiety disorder, unspecified: Secondary | ICD-10-CM

## 2012-07-28 DIAGNOSIS — F17200 Nicotine dependence, unspecified, uncomplicated: Secondary | ICD-10-CM

## 2012-07-28 DIAGNOSIS — Z7189 Other specified counseling: Secondary | ICD-10-CM

## 2012-07-28 DIAGNOSIS — G43909 Migraine, unspecified, not intractable, without status migrainosus: Secondary | ICD-10-CM

## 2012-07-28 DIAGNOSIS — M76821 Posterior tibial tendinitis, right leg: Secondary | ICD-10-CM

## 2012-07-28 LAB — BASIC METABOLIC PANEL
BUN: 14 mg/dL (ref 6–23)
CO2: 30 mEq/L (ref 19–32)
Calcium: 9.3 mg/dL (ref 8.4–10.5)
Creatinine, Ser: 0.7 mg/dL (ref 0.4–1.2)
Glucose, Bld: 86 mg/dL (ref 70–99)

## 2012-07-28 LAB — HEMOGLOBIN A1C: Hgb A1c MFr Bld: 5.5 % (ref 4.6–6.5)

## 2012-07-28 LAB — LIPID PANEL
HDL: 51.4 mg/dL (ref >39.00)
Triglycerides: 98 mg/dL (ref 0.0–149.0)
VLDL: 19.6 mg/dL (ref 0.0–40.0)

## 2012-07-28 LAB — TSH: TSH: 1.25 u[IU]/mL (ref 0.35–5.50)

## 2012-07-28 NOTE — Telephone Encounter (Signed)
Pt advised to go to another md and that would be is over $500 (initial visit). Pt would like to know what should she do next.?

## 2012-07-28 NOTE — Telephone Encounter (Signed)
Pls advise.  

## 2012-07-28 NOTE — Progress Notes (Signed)
Chief Complaint  Patient presents with  . Establish Care  . FMLA    per patient boss suggested     HPI:  Betty Leonard is here to establish care.  Last PCP and physical: sees Dr. Seymour Bars at [facility] for female physicals, hx of abnormal paps but most recent normal per her report.  Has the following chronic problems and concerns today:  Anxiety/Depression: -overwhelmed, anhedonia on many days -feels like job is very stressful - has had a rare panic attack -no hallucinations -does have spells where feel elated and then over purchases -used to be on celexa - stopped this about 1 month; feels like things have worsened since stopping this -has appointment with a psychiatrist next week -never hospitalized, no thoughts of self harm or SI  Migraines: -followed by headache migraine center -on topamax prophylaxis, baclofen prn -stable  Tobacco Use: -4 cigarettes daily -she quits frequently, interested in quitting -wants to quit because of finances and makes running difficult -helps with stress  -not interested in help right now  Posterior Tibial tendonitis/Plantar fasciitis/prior foot surgery: -sees Dr. Leone Haven at [facility] ortho   Patient Active Problem List   Diagnosis Date Noted  . Nausea with vomiting, chronic - followed by Dr. Arlyce Dice and GI 12/16/2011   Health Maintenance:  ROS: See pertinent positives and negatives per HPI.  Past Medical History  Diagnosis Date  . GERD (gastroesophageal reflux disease)   . Migraines   . Depression   . Esophagitis 12/24/2011  . Hemorrhage of rectum and anus 12/16/2011  . Nausea with vomiting, chronic - followed by Dr. Arlyce Dice and GI 12/16/2011  . Alcohol abuse   . Chicken pox   . Frequent headaches   . Seasonal allergies   . Migraines   . UTI (urinary tract infection)     Family History  Problem Relation Age of Onset  . Colon cancer Neg Hx   . Leukemia Paternal Grandfather   . Lung cancer Paternal Grandfather   . Arthritis  Paternal Grandfather   . Diabetes Paternal Grandfather   . Heart disease Paternal Grandfather   . Hypertension Paternal Grandfather   . Kidney disease Paternal Grandfather   . Prostate cancer Paternal Grandfather   . Gaucher's disease Paternal Grandfather   . Sudden death Father     age 67, nothing found on autopsy  . Diabetes Father   . Hyperlipidemia Father   . Hypertension Father   . Mental illness Mother     bipolar, schizophrenia  . Alcohol abuse Maternal Grandfather   . Stroke Paternal Grandmother     History   Social History  . Marital Status: Single    Spouse Name: N/A    Number of Children: N/A  . Years of Education: N/A   Social History Main Topics  . Smoking status: Current Every Day Smoker    Types: Cigarettes  . Smokeless tobacco: Never Used     Comment: per patient 4 to 5 cigarettes a day   . Alcohol Use: Yes     Comment: one drink once a month   . Drug Use: No  . Sexually Active: None   Other Topics Concern  . 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 Takes as directed, Disp: , Rfl: ;  baclofen (LIORESAL) 10 MG tablet, Takes as directed, Disp: , Rfl:   EXAM:  Filed Vitals:   07/28/12 0809  BP: 130/80  Temp: 97.9 F (36.6 C)    Body mass index is 34.23 kg/(m^2).  GENERAL: vitals reviewed and listed above, alert, oriented, appears well hydrated and in no acute distress  HEENT: atraumatic, conjunttiva clear, no obvious abnormalities on inspection of external nose and ears  NECK: no obvious masses on inspection  LUNGS: clear to auscultation bilaterally, no wheezes, rales or rhonchi, good air movement  CV: HRRR, no peripheral  edema  MS: moves all extremities without noticeable abnormality  PSYCH: pleasant and cooperative, no obvious depression or anxiety  ASSESSMENT AND PLAN:  Discussed the following assessment and plan:  Anxiety and depression - Plan: TSH -she has follow up with psych and I advised her to follow their recommendations in terms of leave of absence from work and medications. Query bipolar disorder. Provided list of psychiatrist. Will do basic labs. Return and emergent precautions.  Migraines -continue current tx  Tobacco use disorder -counseled, offered help  Plantar fasciitis of right foot  Posterior tibial tendinitis of right leg  Encounter to establish care - Plan: Lipid panel, Hemoglobin A1c, Basic metabolic panel  -We reviewed the PMH, PSH, FH, SH, Meds and Allergies. -We provided refills for any medications we will prescribe as needed. -We addressed current concerns per orders and patient instructions. -We have asked for records for pertinent exams, studies, vaccines and notes from previous providers. -We have advised patient to follow up per instructions below.  -Patient advised to return or notify a doctor immediately if symptoms worsen or persist or new concerns arise.  Patient Instructions  1) schedule visit with your gynecologist for follow up  2) Call [place] Quitline for help with quitting smoking  3) Please schedule an appointment with a psychiatrist regarding your stress, anxiety and depression, keep your appointment with the counselor  -We have ordered labs or studies at this visit. It can take up to 1-2 weeks for results and processing. We will contact you with instructions IF your results are abnormal. Normal results will be released to your [facility]. If you have not heard from [place] or can not find your results in [facility] in 2 weeks please contact our office.  -PLEASE SIGN UP FOR MYCHART TODAY   We recommend the following healthy lifestyle measures: - eat a healthy  diet consisting of lots of vegetables, fruits, beans, nuts, seeds, healthy meats such as white chicken and fish and whole grains.  - avoid fried foods, fast food, processed foods, sodas, red meet and other fattening foods.  - get a least 150 minutes of aerobic exercise per week.   Follow up in: as needed      [name]

## 2012-07-28 NOTE — Patient Instructions (Addendum)
1) schedule visit with your gynecologist for follow up  2) Call [place] Quitline for help with quitting smoking  3) Please schedule an appointment with a psychiatrist regarding your stress, anxiety and depression, keep your appointment with the counselor  -We have ordered labs or studies at this visit. It can take up to 1-2 weeks for results and processing. We will contact you with instructions IF your results are abnormal. Normal results will be released to your [facility]. If you have not heard from [place] or can not find your results in [facility] in 2 weeks please contact our office.  -PLEASE SIGN UP FOR MYCHART TODAY   We recommend the following healthy lifestyle measures: - eat a healthy diet consisting of lots of vegetables, fruits, beans, nuts, seeds, healthy meats such as white chicken and fish and whole grains.  - avoid fried foods, fast food, processed foods, sodas, red meet and other fattening foods.  - get a least 150 minutes of aerobic exercise per week.   Follow up in: as needed

## 2012-07-28 NOTE — Telephone Encounter (Signed)
Would try to call around as that is higher then I have heard. May be able to ask her counselor for recommendations. Psychiatrist do sometimes have a high deductible up front. Provider her with list of behavioral health options in [place].

## 2012-08-01 NOTE — Telephone Encounter (Signed)
Left a message for pt to return call 

## 2012-08-01 NOTE — Telephone Encounter (Signed)
Called and spoke with pt and pt was given information for low cost mental health facilities.

## 2012-09-01 ENCOUNTER — Ambulatory Visit (INDEPENDENT_AMBULATORY_CARE_PROVIDER_SITE_OTHER): Payer: BC Managed Care – PPO | Admitting: Family Medicine

## 2012-09-01 ENCOUNTER — Encounter: Payer: Self-pay | Admitting: Family Medicine

## 2012-09-01 VITALS — BP 102/70 | Temp 98.4°F | Wt 206.0 lb

## 2012-09-01 DIAGNOSIS — F411 Generalized anxiety disorder: Secondary | ICD-10-CM

## 2012-09-01 DIAGNOSIS — G43909 Migraine, unspecified, not intractable, without status migrainosus: Secondary | ICD-10-CM

## 2012-09-01 DIAGNOSIS — F17200 Nicotine dependence, unspecified, uncomplicated: Secondary | ICD-10-CM

## 2012-09-01 MED ORDER — CITALOPRAM HYDROBROMIDE 20 MG PO TABS: 20.0000 mg | ORAL_TABLET | Freq: Every day | ORAL | Status: DC

## 2012-09-01 NOTE — Patient Instructions (Addendum)
-  start celexa 20mg  daily  -follow up with psychiatrist

## 2012-09-01 NOTE — Progress Notes (Signed)
Chief Complaint  Patient presents with  . Anxiety    pt cannot get an appt with psych until end of Sept or Oct     HPI:  Betty Leonard here for for follow up:   1)Anxiety and depression: -per last visit anhedonia, irritable, overwhelmed, occ elated spells, was to see psych after last visit -same symptoms -has done some counseling at [address] - Betty Leonard - didn't feel like helped, was just [place] session" -celexa helped in the past, wants to start this while waiting to see psych, no SI  2)Migraines: -followed by [facility] -stable  3)Tobacco use: -was to call quit line, get mood under control before trying to quit  ROS: See pertinent positives and negatives per HPI.  Past Medical History  Diagnosis Date  . GERD (gastroesophageal reflux disease)   . Migraines   . Depression   . Esophagitis 12/24/2011  . Hemorrhage of rectum and anus 12/16/2011  . Nausea with vomiting, chronic - followed by Dr. Arlyce Leonard and GI 12/16/2011  . Alcohol abuse   . Chicken pox   . Frequent headaches   . Seasonal allergies   . Migraines   . UTI (urinary tract infection)     Family History  Problem Relation Age of Onset  . Colon cancer Neg Hx   . Leukemia Paternal Grandfather   . Lung cancer Paternal Grandfather   . Arthritis Paternal Grandfather   . Diabetes Paternal Grandfather   . Heart disease Paternal Grandfather   . Hypertension Paternal Grandfather   . Kidney disease Paternal Grandfather   . Prostate cancer Paternal Grandfather   . Gaucher's disease Paternal Grandfather   . Sudden death Father     age 60, nothing found on autopsy  . Diabetes Father   . Hyperlipidemia Father   . Hypertension Father   . Mental illness Mother     bipolar, schizophrenia  . Alcohol abuse Maternal Grandfather   . Stroke Paternal Grandmother     History   Social History  . Marital Status: Single    Spouse Name: N/A    Number of Children: N/A  . Years of Education: N/A   Social History  Main Topics  . Smoking status: Current Every Day Smoker    Types: Cigarettes  . Smokeless tobacco: Never Used     Comment: per patient 4 to 5 cigarettes a day   . Alcohol Use: Yes     Comment: one drink once a month   . Drug Use: No  . Sexual Activity: None   Other Topics Concern  . 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 Takes as directed, Disp: , Rfl:   EXAM:  Filed Vitals:   09/01/12 0905  BP: 102/70  Temp: 98.4 F (36.9 C)    Body mass index is 33.27 kg/(m^2).  GENERAL: vitals reviewed and listed above, alert, oriented, appears  well hydrated and in no acute distress  HEENT: atraumatic, conjunttiva clear, no obvious abnormalities on inspection of external nose and ears  NECK: no obvious masses on inspection  LUNGS: clear to auscultation bilaterally, no wheezes, rales or rhonchi, good air movement  CV: HRRR, no peripheral edema  MS: moves all extremities without noticeable abnormality  PSYCH: pleasant and cooperative, no anxiety, depressed mood  ASSESSMENT AND PLAN:  Discussed the following assessment and plan:  Generalized anxiety disorder - Plan: citalopram (CELEXA) 20 MG tablet -she wants FMLA, time off from work for irritable mood -advised if symptoms that severe needs to be under care of psych and if they feel this is needed would defer to them -discussed tx options to start while she is waiting on psych appt - will restart celexa as she felt this helped sig in the past -  risks and return precuations discussed -advised if sees counselor again to specifically request CBT  Migraines  Tobacco use disorder  -Patient advised to return or notify a doctor immediately if symptoms worsen or persist or new concerns arise.  There are no Patient Instructions on file for this visit.   Betty Leonard R.

## 2012-09-23 ENCOUNTER — Encounter: Payer: Self-pay | Admitting: Family Medicine

## 2012-09-23 ENCOUNTER — Ambulatory Visit (INDEPENDENT_AMBULATORY_CARE_PROVIDER_SITE_OTHER)
Admission: RE | Admit: 2012-09-23 | Discharge: 2012-09-23 | Disposition: A | Discharge status: Home / Self Care | Payer: BC Managed Care – PPO | Source: Ambulatory Visit | Attending: Family Medicine | Admitting: Family Medicine

## 2012-09-23 ENCOUNTER — Ambulatory Visit (INDEPENDENT_AMBULATORY_CARE_PROVIDER_SITE_OTHER): Payer: BC Managed Care – PPO | Admitting: Family Medicine

## 2012-09-23 ENCOUNTER — Ambulatory Visit: Payer: BC Managed Care – PPO | Admitting: Family Medicine

## 2012-09-23 VITALS — BP 120/80 | Temp 98.4°F | Wt 212.0 lb

## 2012-09-23 DIAGNOSIS — M25572 Pain in left ankle and joints of left foot: Secondary | ICD-10-CM

## 2012-09-23 DIAGNOSIS — F329 Major depressive disorder, single episode, unspecified: Secondary | ICD-10-CM | POA: Insufficient documentation

## 2012-09-23 DIAGNOSIS — Z8669 Personal history of other diseases of the nervous system and sense organs: Secondary | ICD-10-CM

## 2012-09-23 DIAGNOSIS — F341 Dysthymic disorder: Secondary | ICD-10-CM

## 2012-09-23 DIAGNOSIS — M25579 Pain in unspecified ankle and joints of unspecified foot: Secondary | ICD-10-CM

## 2012-09-23 DIAGNOSIS — F17200 Nicotine dependence, unspecified, uncomplicated: Secondary | ICD-10-CM

## 2012-09-23 NOTE — Progress Notes (Signed)
Chief Complaint  Patient presents with  . Foot Injury    HPI:  L foot injury: -shelf under desk fell on foot today -having pain in L ant foot -denies: fevers, swelling ,redness, inability to bear wait - but is limping   ANx/depression: -reports much better ROS: See pertinent positives and negatives per HPI.  Past Medical History  Diagnosis Date  . GERD (gastroesophageal reflux disease)   . Migraines   . Depression   . Esophagitis 12/24/2011  . Hemorrhage of rectum and anus 12/16/2011  . Nausea with vomiting, chronic - followed by Dr. Arlyce Dice and GI 12/16/2011  . Alcohol abuse   . Chicken pox   . Frequent headaches   . Seasonal allergies   . Migraines   . UTI (urinary tract infection)     Past Surgical History  Procedure Laterality Date  . Foot surgery    . Esophagogastroduodenoscopy  12/24/2011    Procedure: ESOPHAGOGASTRODUODENOSCOPY (EGD);  Surgeon: Louis Meckel, MD;  Location: [facility];  Service: Endoscopy;  Laterality: N/A;    Family History  Problem Relation Age of Onset  . Colon cancer Neg Hx   . Leukemia Paternal Grandfather   . Lung cancer Paternal Grandfather   . Arthritis Paternal Grandfather   . Diabetes Paternal Grandfather   . Heart disease Paternal Grandfather   . Hypertension Paternal Grandfather   . Kidney disease Paternal Grandfather   . Prostate cancer Paternal Grandfather   . Gaucher's disease Paternal Grandfather   . Sudden death Father     age 20, nothing found on autopsy  . Diabetes Father   . Hyperlipidemia Father   . Hypertension Father   . Mental illness Mother     bipolar, schizophrenia  . Alcohol abuse Maternal Grandfather   . Stroke Paternal Grandmother     History   Social History  . Marital Status: Single    Spouse Name: N/A    Number of Children: N/A  . Years of Education: N/A   Social History Main Topics  . Smoking status: Current Every Day Smoker    Types: Cigarettes  . Smokeless tobacco: Never Used   Comment: per patient 4 to 5 cigarettes a day   . Alcohol Use: Yes     Comment: one drink once a month   . Drug Use: No  . Sexual Activity: None   Other Topics Concern  . 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 Takes as directed, Disp: , Rfl:   EXAM:  Filed Vitals:   09/23/12 1306  BP: 120/80  Temp: 98.4 F (36.9 C)    Body mass index is 34.23 kg/(m^2).  GENERAL: vitals reviewed and listed above, alert, oriented, appears well hydrated and in no acute distress  HEENT: atraumatic, conjunttiva clear, no obvious abnormalities on inspection of external nose and ears  NECK: no obvious masses on inspection  MS: moves  all extremities without noticeable abnormality -limping gait -no swelling or bruising or erythema on inspection of foot -normal pedal pulses and cap refill bilat feet -mild TTP over mid to distal 1st-3rd metatarsals L -neg drawer, neg talar tilt, no other bony TTP  PSYCH: pleasant and cooperative, no obvious depression or anxiety  ASSESSMENT AND PLAN:  Discussed the following assessment and plan:  Pain in joint, ankle and foot, left - Plan: DG Foot Complete Left  Anxiety and depression  Hx of migraine headaches  Tobacco use disorder  -suspect soft tissue contusion of foot, but will get plain films to evaluate, advised post op shoe in meantime until plain films result -ice, tylenol or ibuprofen as needed per instructions -Patient advised to return or notify a doctor  immediately if symptoms worsen or persist or new concerns arise.  There are no Patient Instructions on file for this visit.   [name]

## 2012-11-24 ENCOUNTER — Other Ambulatory Visit: Payer: Self-pay

## 2012-12-02 ENCOUNTER — Emergency Department (HOSPITAL_COMMUNITY)
Admission: EM | Admit: 2012-12-02 | Discharge: 2012-12-02 | Disposition: A | Discharge status: Home / Self Care | Payer: BC Managed Care – PPO | Attending: Emergency Medicine | Admitting: Emergency Medicine

## 2012-12-02 ENCOUNTER — Emergency Department (HOSPITAL_COMMUNITY): Payer: BC Managed Care – PPO

## 2012-12-02 ENCOUNTER — Encounter (HOSPITAL_COMMUNITY): Payer: Self-pay | Admitting: Emergency Medicine

## 2012-12-02 DIAGNOSIS — F3289 Other specified depressive episodes: Secondary | ICD-10-CM | POA: Insufficient documentation

## 2012-12-02 DIAGNOSIS — F17200 Nicotine dependence, unspecified, uncomplicated: Secondary | ICD-10-CM | POA: Insufficient documentation

## 2012-12-02 DIAGNOSIS — W108XXA Fall (on) (from) other stairs and steps, initial encounter: Secondary | ICD-10-CM | POA: Insufficient documentation

## 2012-12-02 DIAGNOSIS — Y929 Unspecified place or not applicable: Secondary | ICD-10-CM | POA: Insufficient documentation

## 2012-12-02 DIAGNOSIS — Z79899 Other long term (current) drug therapy: Secondary | ICD-10-CM | POA: Insufficient documentation

## 2012-12-02 DIAGNOSIS — Z8619 Personal history of other infectious and parasitic diseases: Secondary | ICD-10-CM | POA: Insufficient documentation

## 2012-12-02 DIAGNOSIS — Z8709 Personal history of other diseases of the respiratory system: Secondary | ICD-10-CM | POA: Insufficient documentation

## 2012-12-02 DIAGNOSIS — S93409A Sprain of unspecified ligament of unspecified ankle, initial encounter: Secondary | ICD-10-CM | POA: Insufficient documentation

## 2012-12-02 DIAGNOSIS — R110 Nausea: Secondary | ICD-10-CM | POA: Insufficient documentation

## 2012-12-02 DIAGNOSIS — Z87440 Personal history of urinary (tract) infections: Secondary | ICD-10-CM | POA: Insufficient documentation

## 2012-12-02 DIAGNOSIS — Z8719 Personal history of other diseases of the digestive system: Secondary | ICD-10-CM | POA: Insufficient documentation

## 2012-12-02 DIAGNOSIS — F329 Major depressive disorder, single episode, unspecified: Secondary | ICD-10-CM | POA: Insufficient documentation

## 2012-12-02 DIAGNOSIS — G43909 Migraine, unspecified, not intractable, without status migrainosus: Secondary | ICD-10-CM | POA: Insufficient documentation

## 2012-12-02 DIAGNOSIS — F1021 Alcohol dependence, in remission: Secondary | ICD-10-CM | POA: Insufficient documentation

## 2012-12-02 DIAGNOSIS — Y9301 Activity, walking, marching and hiking: Secondary | ICD-10-CM | POA: Insufficient documentation

## 2012-12-02 NOTE — ED Notes (Signed)
Pt was carrying work computer out to her car when she missed a step going down the stairs and fell. Pt c/o left ankle pain and right calf pain.

## 2012-12-02 NOTE — ED Provider Notes (Signed)
Medical screening examination/treatment/procedure(s) were performed by non-physician practitioner and as supervising physician I was immediately available for consultation/collaboration.  EKG Interpretation   None         Audree Camel, MD 12/02/12 1711

## 2012-12-02 NOTE — ED Provider Notes (Signed)
CSN: [id number]     Arrival date & time 12/02/12  1358 History  This chart was scribed for Marlon Pel, PA-C, working with Audree Camel, MD, by [facility] ED Scribe. This patient was seen in room WTR9/WTR9 and the patient's care was started at 2:24 PM.   Chief Complaint  Patient presents with  . Fall  . Ankle Pain    The history is provided by the patient. No language interpreter was used.    HPI Comments: Betty Leonard is a 31 y.o. female who presents to the Emergency Department complaining of constant, moderate left ankle pain with associated swelling onset after a fall that occurred PTA. She states that she was carrying a box, walking down stairs, mis stepped and fell down 4 stairs. She states that she chose to lie on the floor for about 15 minutes after the fall and was nauseated at that time. She denies head injury or LOC pertaining to the fall. She also states that she has had some mild right calf pain onset after the fall. She denies any other pain or symptoms.   Past Medical History  Diagnosis Date  . GERD (gastroesophageal reflux disease)   . Migraines   . Depression   . Esophagitis 12/24/2011  . Hemorrhage of rectum and anus 12/16/2011  . Nausea with vomiting, chronic - followed by Dr. Arlyce Dice and GI 12/16/2011  . Alcohol abuse   . Chicken pox   . Frequent headaches   . Seasonal allergies   . Migraines   . UTI (urinary tract infection)    Past Surgical History  Procedure Laterality Date  . Foot surgery    . Esophagogastroduodenoscopy  12/24/2011    Procedure: ESOPHAGOGASTRODUODENOSCOPY (EGD);  Surgeon: Louis Meckel, MD;  Location: [facility];  Service: Endoscopy;  Laterality: N/A;   Family History  Problem Relation Age of Onset  . Colon cancer Neg Hx   . Leukemia Paternal Grandfather   . Lung cancer Paternal Grandfather   . Arthritis Paternal Grandfather   . Diabetes Paternal Grandfather   . Heart disease Paternal Grandfather   . Hypertension  Paternal Grandfather   . Kidney disease Paternal Grandfather   . Prostate cancer Paternal Grandfather   . Gaucher's disease Paternal Grandfather   . Sudden death Father     age 74, nothing found on autopsy  . Diabetes Father   . Hyperlipidemia Father   . Hypertension Father   . Mental illness Mother     bipolar, schizophrenia  . Alcohol abuse Maternal Grandfather   . Stroke Paternal Grandmother    History  Substance Use Topics  . Smoking status: Current Every Day Smoker    Types: Cigarettes  . Smokeless tobacco: Never Used     Comment: per patient 4 to 5 cigarettes a day   . Alcohol Use: Yes     Comment: one drink once a month    OB History   Grav Para Term Preterm Abortions TAB SAB Ect Mult Living                 Review of Systems  Gastrointestinal: Positive for nausea. Negative for vomiting.  Musculoskeletal: Positive for arthralgias (left ankle), joint swelling (left ankle) and myalgias (right calf).  Neurological: Negative for syncope and headaches.  All other systems reviewed and are negative.   Allergies  Codeine and Aspirin  Home Medications   Current Outpatient Rx  Name  Route  Sig  Dispense  Refill  . baclofen (  LIORESAL) 10 MG tablet   Oral   Take 10 mg by mouth as needed. Takes as directed         . citalopram (CELEXA) 20 MG tablet   Oral   Take 1 tablet (20 mg total) by mouth daily.   30 tablet   1   . topiramate (TOPAMAX) 100 MG tablet   Oral   Take 100 mg by mouth daily.          Triage vitals: BP 131/79  Pulse 98  Temp(Src) 98 F (36.7 C) (Oral)  Resp 18  SpO2 100%  LMP 11/02/2012  Physical Exam  Nursing note and vitals reviewed. Constitutional: She is oriented to person, place, and time. She appears well-developed and well-nourished. No distress.  HENT:  Head: Normocephalic and atraumatic.  Eyes: EOM are normal.  Neck: Neck supple. No tracheal deviation present.  Cardiovascular: Normal rate.   Pulmonary/Chest: Effort normal.  No respiratory distress.  Musculoskeletal:       Left ankle: She exhibits decreased range of motion, swelling and ecchymosis. She exhibits no deformity, no laceration and normal pulse. Tenderness. Lateral malleolus and medial malleolus tenderness found. Achilles tendon normal.  Neurological: She is alert and oriented to person, place, and time.  Skin: Skin is warm and dry.  Psychiatric: She has a normal mood and affect. Her behavior is normal.    ED Course  Procedures (including critical care time)  DIAGNOSTIC STUDIES: Oxygen Saturation is 100% on RA, normal by my interpretation.    COORDINATION OF CARE: 2:27 PM- Pt advised of plan for treatment and pt agrees.  2:34 PM- Recheck with pt to discuss normal radiology findings.  Labs Review Labs Reviewed - No data to display Imaging Review Dg Ankle Complete Left  12/02/2012   CLINICAL DATA:  Fall, swelling, pain  EXAM: LEFT ANKLE COMPLETE - 3+ VIEW  COMPARISON:  None.  FINDINGS: There is no evidence of fracture, dislocation, or joint effusion. There is no evidence of arthropathy or other focal bone abnormality. Soft tissues are unremarkable.  IMPRESSION: No acute osseous finding   Electronically Signed   By: Ruel Favors M.D.   On: 12/02/2012 14:24    EKG Interpretation   None       MDM   1. Ankle sprain and strain, left, initial encounter    Pt requests cam-walker boot because she is starting a new job and can't be on crutches.   Declines pain medication. Ice applied in the ED. Ortho referral given  32 y.o.Betty Leonard's evaluation in the Emergency Department is complete. It has been determined that no acute conditions requiring further emergency intervention are present at this time. The patient/guardian have been advised of the diagnosis and plan. We have discussed signs and symptoms that warrant return to the ED, such as changes or worsening in symptoms.  Vital signs are stable at discharge. Filed Vitals:   12/02/12  1410  BP:   Pulse: 98  Temp:   Resp:     Patient/guardian has voiced understanding and agreed to follow-up with the PCP or specialist.  I personally performed the services described in this documentation, which was scribed in my presence. The recorded information has been reviewed and is accurate.   Dorthula Matas, PA-C 12/02/12 [phone]

## 2012-12-28 ENCOUNTER — Emergency Department (HOSPITAL_COMMUNITY): Payer: BC Managed Care – PPO

## 2012-12-28 ENCOUNTER — Emergency Department (HOSPITAL_COMMUNITY)
Admission: EM | Admit: 2012-12-28 | Discharge: 2012-12-28 | Disposition: A | Discharge status: Home / Self Care | Payer: BC Managed Care – PPO | Attending: Emergency Medicine | Admitting: Emergency Medicine

## 2012-12-28 ENCOUNTER — Encounter (HOSPITAL_COMMUNITY): Payer: Self-pay | Admitting: Emergency Medicine

## 2012-12-28 DIAGNOSIS — F329 Major depressive disorder, single episode, unspecified: Secondary | ICD-10-CM | POA: Insufficient documentation

## 2012-12-28 DIAGNOSIS — G43909 Migraine, unspecified, not intractable, without status migrainosus: Secondary | ICD-10-CM | POA: Insufficient documentation

## 2012-12-28 DIAGNOSIS — Z8619 Personal history of other infectious and parasitic diseases: Secondary | ICD-10-CM | POA: Insufficient documentation

## 2012-12-28 DIAGNOSIS — F17200 Nicotine dependence, unspecified, uncomplicated: Secondary | ICD-10-CM | POA: Insufficient documentation

## 2012-12-28 DIAGNOSIS — Z9109 Other allergy status, other than to drugs and biological substances: Secondary | ICD-10-CM | POA: Insufficient documentation

## 2012-12-28 DIAGNOSIS — Z9889 Other specified postprocedural states: Secondary | ICD-10-CM | POA: Insufficient documentation

## 2012-12-28 DIAGNOSIS — G8911 Acute pain due to trauma: Secondary | ICD-10-CM | POA: Insufficient documentation

## 2012-12-28 DIAGNOSIS — M25579 Pain in unspecified ankle and joints of unspecified foot: Secondary | ICD-10-CM | POA: Insufficient documentation

## 2012-12-28 DIAGNOSIS — Z87440 Personal history of urinary (tract) infections: Secondary | ICD-10-CM | POA: Insufficient documentation

## 2012-12-28 DIAGNOSIS — Z79899 Other long term (current) drug therapy: Secondary | ICD-10-CM | POA: Insufficient documentation

## 2012-12-28 DIAGNOSIS — S93402S Sprain of unspecified ligament of left ankle, sequela: Secondary | ICD-10-CM

## 2012-12-28 DIAGNOSIS — Z8719 Personal history of other diseases of the digestive system: Secondary | ICD-10-CM | POA: Insufficient documentation

## 2012-12-28 DIAGNOSIS — F3289 Other specified depressive episodes: Secondary | ICD-10-CM | POA: Insufficient documentation

## 2012-12-28 NOTE — ED Notes (Signed)
She tells me that she is having persistent pain/swelling of her left ankle.  She states she had fallen down stairs at that time and had x-rays.  She states she has taken a month from her work to care for her ankle and she is surprised that she is continuing to have symptoms.  She states "It actually feels worse".

## 2012-12-28 NOTE — ED Notes (Signed)
Pt hurt foot a month ago after falling down stairs; seen at that time; states foot continues to swell; feels worse

## 2012-12-28 NOTE — ED Provider Notes (Signed)
CSN: [id number]     Arrival date & time 12/28/12  1436 History   First MD Initiated Contact with Patient 12/28/12 1501     Chief Complaint  Patient presents with  . Foot Injury   (Consider location/radiation/quality/duration/timing/severity/associated sxs/prior Treatment) HPI  Patient states she fell down about 5 steps on November 14. She states she fell to the floor she had immediate pain in her left ankle. She was seen in the emergency department that same day. She had x-rays done that were normal and she was placed in ASO. Patient requested a Cam Walker which she was given. She reports she continues to have persistent pain and swelling in her left ankle. She states she had a lot of bruising and swelling initially "like a softball" however she still has some swelling. She states she's taking 4 Advil 3-4 times a day. She states she was not given orthopedic referral. She states she was told to follow up with her PCP and states "they don't even have an x-ray machine". She denies any new reinjury.  PCP Dr Selena Batten  Past Medical History  Diagnosis Date  . GERD (gastroesophageal reflux disease)   . Migraines   . Depression   . Esophagitis 12/24/2011  . Hemorrhage of rectum and anus 12/16/2011  . Nausea with vomiting, chronic - followed by Dr. Arlyce Dice and GI 12/16/2011  . Alcohol abuse   . Chicken pox   . Frequent headaches   . Seasonal allergies   . Migraines   . UTI (urinary tract infection)    Past Surgical History  Procedure Laterality Date  . Foot surgery    . Esophagogastroduodenoscopy  12/24/2011    Procedure: ESOPHAGOGASTRODUODENOSCOPY (EGD);  Surgeon: Louis Meckel, MD;  Location: [facility];  Service: Endoscopy;  Laterality: N/A;   Family History  Problem Relation Age of Onset  . Colon cancer Neg Hx   . Leukemia Paternal Grandfather   . Lung cancer Paternal Grandfather   . Arthritis Paternal Grandfather   . Diabetes Paternal Grandfather   . Heart disease Paternal  Grandfather   . Hypertension Paternal Grandfather   . Kidney disease Paternal Grandfather   . Prostate cancer Paternal Grandfather   . Gaucher's disease Paternal Grandfather   . Sudden death Father     age 70, nothing found on autopsy  . Diabetes Father   . Hyperlipidemia Father   . Hypertension Father   . Mental illness Mother     bipolar, schizophrenia  . Alcohol abuse Maternal Grandfather   . Stroke Paternal Grandmother    History  Substance Use Topics  . Smoking status: Current Every Day Smoker    Types: Cigarettes  . Smokeless tobacco: Never Used     Comment: per patient 4 to 5 cigarettes a day   . Alcohol Use: Yes     Comment: one drink once a month    Employed Smokes 1/2 ppd   OB History   Grav Para Term Preterm Abortions TAB SAB Ect Mult Living                 Review of Systems  All other systems reviewed and are negative.    Allergies  Codeine and Aspirin  Home Medications   Current Outpatient Rx  Name  Route  Sig  Dispense  Refill  . baclofen (LIORESAL) 10 MG tablet   Oral   Take 10 mg by mouth as needed for muscle spasms. Takes as directed         .  ibuprofen (ADVIL,MOTRIN) 200 MG tablet   Oral   Take 800 mg by mouth every 6 (six) hours as needed.         . topiramate (TOPAMAX) 100 MG tablet   Oral   Take 100 mg by mouth daily.         . citalopram (CELEXA) 20 MG tablet   Oral   Take 1 tablet (20 mg total) by mouth daily.   30 tablet   1    BP 135/78  Pulse 97  Temp(Src) 98.7 F (37.1 C) (Oral)  Resp 20  SpO2 98%  LMP 12/14/2012  Vital signs normal   Physical Exam  Nursing note and vitals reviewed. Constitutional: She is oriented to person, place, and time. She appears well-developed and well-nourished.  Non-toxic appearance. She does not appear ill. No distress.  HENT:  Head: Normocephalic and atraumatic.  Right Ear: External ear normal.  Left Ear: External ear normal.  Nose: Nose normal. No mucosal edema or  rhinorrhea.  Mouth/Throat: Mucous membranes are normal. No dental abscesses or uvula swelling.  Eyes: Conjunctivae and EOM are normal. Pupils are equal, round, and reactive to light.  Neck: Normal range of motion and full passive range of motion without pain.  Cardiovascular: Normal rate, regular rhythm and normal heart sounds.  Exam reveals no gallop and no friction rub.   No murmur heard. Pulmonary/Chest: Effort normal. No respiratory distress. She has no rhonchi. She exhibits no crepitus.  Abdominal: Normal appearance. There is no rebound.  Musculoskeletal: Normal range of motion. She exhibits edema and tenderness.  Moves all extremities well. Nontender knee, proximal fibular, shin, and foot on the left. Has mild swelling of the medial malleolus and more so on the lateral malleolus with tenderness only over the prox talofibular ligament (inf to the lateral malleolus).   Neurological: She is alert and oriented to person, place, and time. She has normal strength. No cranial nerve deficit.  Skin: Skin is warm, dry and intact. No rash noted. No erythema. No pallor.  Psychiatric: She has a normal mood and affect. Her speech is normal and behavior is normal. Her mood appears not anxious.    ED Course  Procedures (including critical care time)   Pt requesting xray to make sure something wasn't broken. States she is going back to work in 5 days, has been off for 1 month. Pt states she has her ASO and Cam walker in her car.   Labs Review Labs Reviewed - No data to display Imaging Review Dg Ankle Complete Left  12/28/2012   CLINICAL DATA:  Pain post injury 6 weeks ago  EXAM: LEFT ANKLE COMPLETE - 3+ VIEW  COMPARISON:  12/02/2012  FINDINGS: Three views of left ankle submitted. No acute fracture or subluxation. Ankle mortise is preserved. There is soft tissue swelling adjacent to lateral malleolus.  IMPRESSION: No acute fracture or subluxation. Soft tissue swelling adjacent to lateral malleolus.    Electronically Signed   By: Natasha Mead M.D.   On: 12/28/2012 15:43    EKG Interpretation   None       MDM   1. Sprain of ankle, left, sequela    Plan discharge   Devoria Albe, MD, Franz Dell, MD 12/28/12 [phone]

## 2013-04-04 ENCOUNTER — Encounter: Payer: Self-pay | Admitting: Family Medicine

## 2013-04-04 ENCOUNTER — Ambulatory Visit (INDEPENDENT_AMBULATORY_CARE_PROVIDER_SITE_OTHER): Payer: No Typology Code available for payment source | Admitting: Family Medicine

## 2013-04-04 ENCOUNTER — Telehealth: Payer: Self-pay | Admitting: Family Medicine

## 2013-04-04 VITALS — BP 92/60 | Temp 97.7°F | Wt 205.0 lb

## 2013-04-04 DIAGNOSIS — J329 Chronic sinusitis, unspecified: Secondary | ICD-10-CM

## 2013-04-04 DIAGNOSIS — N760 Acute vaginitis: Secondary | ICD-10-CM

## 2013-04-04 MED ORDER — AMOXICILLIN 875 MG PO TABS: 875.0000 mg | ORAL_TABLET | Freq: Two times a day (BID) | ORAL | Status: DC

## 2013-04-04 MED ORDER — FLUCONAZOLE 150 MG PO TABS: 150.0000 mg | ORAL_TABLET | Freq: Once | ORAL | Status: DC

## 2013-04-04 NOTE — Progress Notes (Signed)
Pre visit review using our clinic review tool, if applicable. No additional management support is needed unless otherwise documented below in the visit note. 

## 2013-04-04 NOTE — Progress Notes (Signed)
Chief Complaint  Patient presents with  . Sinusitis    HPI:  -started: 2-3 weeks ago and worse hte last 4 days -symptoms:nasal congestion, sore throat, cough, sinus pain and pressure, low grade temp -denies:SOB, NVD, tooth pain -has tried: musinex, nyquil -sick contacts/travel/risks: denies flu exposure Ebola risks -Hx of: allergies -always gets bad yeast infection when takes abx, requests diflucan   Anxiety/Depression: -MUCH improved, changed job and loves new job  ROS: See pertinent positives and negatives per HPI.  Past Medical History  Diagnosis Date  . GERD (gastroesophageal reflux disease)   . Migraines   . Depression   . Esophagitis 12/24/2011  . Hemorrhage of rectum and anus 12/16/2011  . Nausea with vomiting, chronic - followed by Dr. Deatra Ina and GI 12/16/2011  . Alcohol abuse   . Chicken pox   . Frequent headaches   . Seasonal allergies   . Migraines   . UTI (urinary tract infection)     Past Surgical History  Procedure Laterality Date  . Foot surgery    . Esophagogastroduodenoscopy  12/24/2011    Procedure: ESOPHAGOGASTRODUODENOSCOPY (EGD);  Surgeon: Inda Castle, MD;  Location: [facility];  Service: Endoscopy;  Laterality: N/A;    Family History  Problem Relation Age of Onset  . Colon cancer Neg Hx   . Leukemia Paternal Grandfather   . Lung cancer Paternal Grandfather   . Arthritis Paternal Grandfather   . Diabetes Paternal Grandfather   . Heart disease Paternal Grandfather   . Hypertension Paternal Grandfather   . Kidney disease Paternal Grandfather   . Prostate cancer Paternal Grandfather   . Gaucher's disease Paternal Grandfather   . Sudden death Father     age 6, nothing found on autopsy  . Diabetes Father   . Hyperlipidemia Father   . Hypertension Father   . Mental illness Mother     bipolar, schizophrenia  . Alcohol abuse Maternal Grandfather   . Stroke Paternal Grandmother     History   Social History  . Marital Status: Single     Spouse Name: N/A    Number of Children: N/A  . Years of Education: N/A   Social History Main Topics  . Smoking status: Current Every Day Smoker    Types: Cigarettes  . Smokeless tobacco: Never Used     Comment: per patient 4 to 5 cigarettes a day   . Alcohol Use: Yes     Comment: one drink once a month   . Drug Use: No  . Sexual Activity: None   Other Topics Concern  . 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 Takes as directed, Disp: , Rfl:  fluconazole (DIFLUCAN) 150 MG tablet, Take 1 tablet (150 mg total) by mouth once. Can take one more in 1 week if still having symptoms., Disp: 2 tablet, Rfl:  0;  ibuprofen (ADVIL,MOTRIN) 200 MG tablet, Take 800 mg by mouth every 6 (six) hours as needed., Disp: , Rfl:   EXAM:  Filed Vitals:   04/04/13 0901  BP: 92/60  Temp: 97.7 F (36.5 C)    Body mass index is 33.1 kg/(m^2).  GENERAL: vitals reviewed and listed above, alert, oriented, appears well hydrated and in no acute distress  HEENT: atraumatic, conjunttiva clear, no obvious abnormalities on inspection of external nose and ears, normal appearance of ear canals and TMs, thick white nasal congestion, mild post oropharyngeal erythema with PND, no tonsillar edema or exudate, no sinus TTP  NECK: no obvious masses on inspection  LUNGS: clear to auscultation bilaterally, no wheezes, rales or rhonchi, good air movement  CV: HRRR, no peripheral  edema  MS: moves all extremities without noticeable abnormality  PSYCH: pleasant and cooperative, no obvious depression or anxiety  ASSESSMENT AND PLAN:  Discussed the following assessment and plan:  Sinusitis - Plan: amoxicillin (AMOXIL) 875 MG tablet  Vaginitis and vulvovaginitis - Plan: fluconazole (DIFLUCAN) 150 MG tablet  -we discussed possible serious and likely etiologies, workup and treatment, treatment risks and return precautions -after this discussion, Betty Leonard opted for abx  -follow up advised as needed -of course, we advised Betty Leonard  to return or notify a doctor immediately if symptoms worsen or persist or new concerns arise.  -of course, we advised to return or notify a doctor immediately if symptoms worsen or persist or new concerns arise.    There are no Patient Instructions on file for this visit.   Colin Benton R.

## 2013-04-04 NOTE — Telephone Encounter (Signed)
Relevant patient education assigned to patient using Emmi. ° °

## 2013-04-12 ENCOUNTER — Emergency Department (HOSPITAL_COMMUNITY): Payer: No Typology Code available for payment source

## 2013-04-12 ENCOUNTER — Encounter (HOSPITAL_COMMUNITY): Payer: Self-pay | Admitting: Emergency Medicine

## 2013-04-12 ENCOUNTER — Emergency Department (HOSPITAL_COMMUNITY)
Admission: EM | Admit: 2013-04-12 | Discharge: 2013-04-12 | Disposition: A | Discharge status: Home / Self Care | Payer: No Typology Code available for payment source | Attending: Emergency Medicine | Admitting: Emergency Medicine

## 2013-04-12 DIAGNOSIS — Y92410 Unspecified street and highway as the place of occurrence of the external cause: Secondary | ICD-10-CM | POA: Insufficient documentation

## 2013-04-12 DIAGNOSIS — Z8619 Personal history of other infectious and parasitic diseases: Secondary | ICD-10-CM | POA: Insufficient documentation

## 2013-04-12 DIAGNOSIS — IMO0002 Reserved for concepts with insufficient information to code with codable children: Secondary | ICD-10-CM | POA: Insufficient documentation

## 2013-04-12 DIAGNOSIS — Z87440 Personal history of urinary (tract) infections: Secondary | ICD-10-CM | POA: Insufficient documentation

## 2013-04-12 DIAGNOSIS — Y9389 Activity, other specified: Secondary | ICD-10-CM | POA: Insufficient documentation

## 2013-04-12 DIAGNOSIS — Z79899 Other long term (current) drug therapy: Secondary | ICD-10-CM | POA: Insufficient documentation

## 2013-04-12 DIAGNOSIS — Z8719 Personal history of other diseases of the digestive system: Secondary | ICD-10-CM | POA: Insufficient documentation

## 2013-04-12 DIAGNOSIS — G43909 Migraine, unspecified, not intractable, without status migrainosus: Secondary | ICD-10-CM | POA: Insufficient documentation

## 2013-04-12 DIAGNOSIS — Z8659 Personal history of other mental and behavioral disorders: Secondary | ICD-10-CM | POA: Insufficient documentation

## 2013-04-12 DIAGNOSIS — F17200 Nicotine dependence, unspecified, uncomplicated: Secondary | ICD-10-CM | POA: Insufficient documentation

## 2013-04-12 DIAGNOSIS — S40021A Contusion of right upper arm, initial encounter: Secondary | ICD-10-CM

## 2013-04-12 DIAGNOSIS — S5010XA Contusion of unspecified forearm, initial encounter: Secondary | ICD-10-CM | POA: Insufficient documentation

## 2013-04-12 DIAGNOSIS — Z792 Long term (current) use of antibiotics: Secondary | ICD-10-CM | POA: Insufficient documentation

## 2013-04-12 MED ORDER — IBUPROFEN 800 MG PO TABS
800.0000 mg | ORAL_TABLET | Freq: Once | ORAL | Status: AC
Start: 2013-04-12 — End: 2013-04-12
  Administered 2013-04-12: 800 mg via ORAL
  Filled 2013-04-12: qty 1
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
## 2013-04-13 NOTE — ED Provider Notes (Signed)
Medical screening examination/treatment/procedure(s) were performed by non-physician practitioner and as supervising physician I was immediately available for consultation/collaboration.   EKG Interpretation None      Rolland Porter, MD, Abram Sander   Janice Norrie, MD 04/13/13 [phone]

## 2013-07-07 ENCOUNTER — Ambulatory Visit (INDEPENDENT_AMBULATORY_CARE_PROVIDER_SITE_OTHER): Payer: No Typology Code available for payment source | Admitting: Family Medicine

## 2013-07-07 ENCOUNTER — Encounter: Payer: Self-pay | Admitting: Family Medicine

## 2013-07-07 VITALS — BP 104/68 | HR 85 | Temp 98.8°F | Ht 66.0 in | Wt 209.0 lb

## 2013-07-07 DIAGNOSIS — J328 Other chronic sinusitis: Secondary | ICD-10-CM

## 2013-07-07 MED ORDER — FLUTICASONE PROPIONATE 50 MCG/ACT NA SUSP: 2.0000 | Freq: Every day | NASAL | Status: DC

## 2013-07-07 NOTE — Progress Notes (Signed)
No chief complaint on file.   HPI:  Sinus congestion -started: 3 years ago and reports really never goes away -symptoms:nasal congestion, PND, coughing, sneezing, eyes irritated -denies:fever, SOB, NVD, tooth pain -has tried: nothing -sick contacts/travel/risks: denies flu exposure, tick exposure or or Ebola risks -Hx of: allergies, tx for sinusitis in 03/2013 - reports never saw ent or allergist ROS: See pertinent positives and negatives per HPI.  Past Medical History  Diagnosis Date  . GERD (gastroesophageal reflux disease)   . Migraines   . Depression   . Esophagitis 12/24/2011  . Hemorrhage of rectum and anus 12/16/2011  . Nausea with vomiting, chronic - followed by Dr. Deatra Ina and GI 12/16/2011  . Alcohol abuse   . Chicken pox   . Frequent headaches   . Seasonal allergies   . Migraines   . UTI (urinary tract infection)     Past Surgical History  Procedure Laterality Date  . Foot surgery    . Esophagogastroduodenoscopy  12/24/2011    Procedure: ESOPHAGOGASTRODUODENOSCOPY (EGD);  Surgeon: Inda Castle, MD;  Location: [facility];  Service: Endoscopy;  Laterality: N/A;    Family History  Problem Relation Age of Onset  . Colon cancer Neg Hx   . Leukemia Paternal Grandfather   . Lung cancer Paternal Grandfather   . Arthritis Paternal Grandfather   . Diabetes Paternal Grandfather   . Heart disease Paternal Grandfather   . Hypertension Paternal Grandfather   . Kidney disease Paternal Grandfather   . Prostate cancer Paternal Grandfather   . Gaucher's disease Paternal Grandfather   . Sudden death Father     age 81, nothing found on autopsy  . Diabetes Father   . Hyperlipidemia Father   . Hypertension Father   . Mental illness Mother     bipolar, schizophrenia  . Alcohol abuse Maternal Grandfather   . Stroke Paternal Grandmother     History   Social History  . Marital Status: Single    Spouse Name: N/A    Number of Children: N/A  . Years of Education: N/A    Social History Main Topics  . Smoking status: Current Every Day Smoker    Types: Cigarettes  . Smokeless tobacco: Never Used     Comment: per patient 4 to 5 cigarettes a day   . Alcohol Use: Yes     Comment: one drink once a month   . Drug Use: No  . Sexual Activity: None   Other Topics Concern  . None   Social History Narrative   Work or Database administrator, [employer]      Home Situation: lives with roomate      Spiritual Beliefs: Christian      Lifestyle: CV exercise (53minutes) 3 days per week; diet is poor                Current outpatient prescriptions:baclofen (LIORESAL) 10 MG tablet, Take 10 mg by mouth as needed for muscle spasms. Takes as directed, Disp: , Rfl: ;  ibuprofen (ADVIL,MOTRIN) 200 MG tablet, Take 800 mg by mouth every 6 (six) hours as needed (for pain). , Disp: , Rfl: ;  topiramate (TOPAMAX) 100 MG tablet, Take 100 mg by mouth daily., Disp: , Rfl:  fluticasone (FLONASE) 50 MCG/ACT nasal spray, Place 2 sprays into both nostrils daily., Disp: 16 g, Rfl: 1  EXAM:  Filed Vitals:   07/07/13 1455  BP: 104/68  Pulse: 85  Temp: 98.8 F (37.1 C)    Body mass index is 33.75  kg/(m^2).  GENERAL: vitals reviewed and listed above, alert, oriented, appears well hydrated and in no acute distress  HEENT: atraumatic, conjunttiva clear, no obvious abnormalities on inspection of external nose and ears, normal appearance of ear canals and TMs, clear nasal congestion, mild post oropharyngeal erythema with PND, no tonsillar edema or exudate, no sinus TTP  NECK: no obvious masses on inspection  LUNGS: clear to auscultation bilaterally, no wheezes, rales or rhonchi, good air movement  CV: HRRR, no peripheral edema  MS: moves all extremities without noticeable abnormality  PSYCH: pleasant and cooperative, no obvious depression or anxiety  ASSESSMENT AND PLAN:  Discussed the following assessment and plan:  Other chronic sinusitis - Plan: fluticasone (FLONASE) 50  MCG/ACT nasal spray  -given HPI and exam findings today, a serious infection or illness is unlikely. We discussed potential etiologies, with allergic etiology being most likely. We discussed treatment side effects, likely course, antibiotic misuse, transmission, and signs of developing a serious illness. -of course, we advised to return or notify a doctor immediately if symptoms worsen or persist or new concerns arise.    Patient Instructions  -flonase OR nasocort (whichever is cheaper for you) daily per instructions  -claritin daily  -AFRIN for 4 days then STOP  -call ear nose and throat doctor office to schedule appointment       KIM, Nickola Major.

## 2013-07-07 NOTE — Progress Notes (Signed)
Pre visit review using our clinic review tool, if applicable. No additional management support is needed unless otherwise documented below in the visit note. 

## 2013-07-07 NOTE — Patient Instructions (Addendum)
-  flonase OR nasocort (whichever is cheaper for you) daily per instructions  -claritin daily  -AFRIN for 4 days then STOP  -call ear nose and throat doctor office to schedule appointment

## 2013-08-01 ENCOUNTER — Encounter: Payer: Self-pay | Admitting: *Deleted

## 2013-08-07 ENCOUNTER — Ambulatory Visit (INDEPENDENT_AMBULATORY_CARE_PROVIDER_SITE_OTHER): Payer: No Typology Code available for payment source | Admitting: *Deleted

## 2013-08-07 ENCOUNTER — Telehealth: Payer: Self-pay | Admitting: *Deleted

## 2013-08-07 DIAGNOSIS — Z23 Encounter for immunization: Secondary | ICD-10-CM

## 2013-08-07 DIAGNOSIS — B999 Unspecified infectious disease: Secondary | ICD-10-CM

## 2013-08-07 NOTE — Telephone Encounter (Signed)
I called [facility]-Dr VanWinkle's office and spoke with Anderson Malta and informed her the pt is here for a Pneumonia vaccine and we do not have an order nor know which type is needed per Dr Maudie Mercury.  Jennifer faxed the order which states the pt needs the Pneumococcal 23 vaccine for recurrent infections and this was approved by Dr Maudie Mercury and the pt was given the injection. The order was sent to be scanned along with lab test results.

## 2013-08-14 ENCOUNTER — Encounter: Payer: Self-pay | Admitting: *Deleted

## 2013-08-22 ENCOUNTER — Telehealth: Payer: Self-pay | Admitting: *Deleted

## 2013-08-22 ENCOUNTER — Ambulatory Visit (INDEPENDENT_AMBULATORY_CARE_PROVIDER_SITE_OTHER)
Admission: RE | Admit: 2013-08-22 | Discharge: 2013-08-22 | Disposition: A | Discharge status: Home / Self Care | Payer: No Typology Code available for payment source | Source: Ambulatory Visit | Attending: Family Medicine | Admitting: Family Medicine

## 2013-08-22 ENCOUNTER — Ambulatory Visit (INDEPENDENT_AMBULATORY_CARE_PROVIDER_SITE_OTHER): Payer: No Typology Code available for payment source | Admitting: Family Medicine

## 2013-08-22 ENCOUNTER — Other Ambulatory Visit: Payer: Self-pay | Admitting: Family Medicine

## 2013-08-22 ENCOUNTER — Encounter: Payer: Self-pay | Admitting: Family Medicine

## 2013-08-22 VITALS — BP 98/62 | HR 84 | Temp 98.6°F | Ht 66.0 in | Wt 211.0 lb

## 2013-08-22 DIAGNOSIS — R059 Cough, unspecified: Secondary | ICD-10-CM

## 2013-08-22 DIAGNOSIS — R062 Wheezing: Secondary | ICD-10-CM

## 2013-08-22 DIAGNOSIS — R05 Cough: Secondary | ICD-10-CM

## 2013-08-22 DIAGNOSIS — J329 Chronic sinusitis, unspecified: Secondary | ICD-10-CM

## 2013-08-22 DIAGNOSIS — J310 Chronic rhinitis: Secondary | ICD-10-CM

## 2013-08-22 NOTE — Telephone Encounter (Signed)
Betty Leonard called from [facility] stating the pt is there for an x-ray now and has a cough and the orders are for an Abdomen 2V.  I advised Betty Leonard per Dr Maudie Mercury she can change this to a chest x-ray 2V.

## 2013-08-22 NOTE — Patient Instructions (Signed)
-  afrin for 4 days  -allergy treatments per allergist  -get the chest xray  -if worsening or not improving see your asthma or allergist

## 2013-08-22 NOTE — Progress Notes (Signed)
No chief complaint on file.   HPI:  -started: 3 years ago, was better the last few weeks then started again 3 days ago -symptoms:nasal congestion, sore throat, cough, wheezing, fever on Sunday of 101, no documented fever since -denies:fever, SOB, NVD, tooth pain -has tried:  Has been taking zyzal -sick contacts/travel/risks: Sunday school friend whom she has been around with same -Hx of: allergies - seeing [facility], just saw them 08/14/13, saw her 6/19 and advised she see ENT   ROS: See pertinent positives and negatives per HPI.  Past Medical History  Diagnosis Date  . GERD (gastroesophageal reflux disease)   . Migraines   . Depression   . Esophagitis 12/24/2011  . Hemorrhage of rectum and anus 12/16/2011  . Nausea with vomiting, chronic - followed by Dr. Deatra Ina and GI 12/16/2011  . Alcohol abuse   . Chicken pox   . Frequent headaches   . Seasonal allergies   . Migraines   . UTI (urinary tract infection)     Past Surgical History  Procedure Laterality Date  . Foot surgery    . Esophagogastroduodenoscopy  12/24/2011    Procedure: ESOPHAGOGASTRODUODENOSCOPY (EGD);  Surgeon: Inda Castle, MD;  Location: [facility];  Service: Endoscopy;  Laterality: N/A;    Family History  Problem Relation Age of Onset  . Colon cancer Neg Hx   . Leukemia Paternal Grandfather   . Lung cancer Paternal Grandfather   . Arthritis Paternal Grandfather   . Diabetes Paternal Grandfather   . Heart disease Paternal Grandfather   . Hypertension Paternal Grandfather   . Kidney disease Paternal Grandfather   . Prostate cancer Paternal Grandfather   . Gaucher's disease Paternal Grandfather   . Sudden death Father     age 12, nothing found on autopsy  . Diabetes Father   . Hyperlipidemia Father   . Hypertension Father   . Mental illness Mother     bipolar, schizophrenia  . Alcohol abuse Maternal Grandfather   . Stroke Paternal Grandmother     History   Social History  .  Marital Status: Single    Spouse Name: N/A    Number of Children: N/A  . Years of Education: N/A   Social History Main Topics  . Smoking status: Current Every Day Smoker    Types: Cigarettes  . Smokeless tobacco: Never Used     Comment: per patient 4 to 5 cigarettes a day   . Alcohol Use: Yes     Comment: one drink once a month   . Drug Use: No  . Sexual Activity: None   Other Topics Concern  . None   Social History Narrative   Work or Database administrator, [employer]      Home Situation: lives with roomate      Spiritual Beliefs: Christian      Lifestyle: CV exercise (65minutes) 3 days per week; diet is poor                Current outpatient prescriptions:baclofen (LIORESAL) 10 MG tablet, Take 10 mg by mouth as needed for muscle spasms. Takes as directed, Disp: , Rfl: ;  ibuprofen (ADVIL,MOTRIN) 200 MG tablet, Take 800 mg by mouth every 6 (six) hours as needed (for pain). , Disp: , Rfl: ;  topiramate (TOPAMAX) 100 MG tablet, Take 100 mg by mouth daily., Disp: , Rfl:   EXAM:  Filed Vitals:   08/22/13 1043  BP: 98/62  Pulse: 84  Temp: 98.6 F (37 C)  Body mass index is 34.07 kg/(m^2).  GENERAL: vitals reviewed and listed above, alert, oriented, appears well hydrated and in no acute distress  HEENT: atraumatic, conjunttiva clear, no obvious abnormalities on inspection of external nose and ears, normal appearance of ear canals and TMs, clear nasal congestion, mild post oropharyngeal erythema with PND, no tonsillar edema or exudate, no sinus TTP  NECK: no obvious masses on inspection  LUNGS: clear to auscultation bilaterally, no wheezes, rales or rhonchi, good air movement  CV: HRRR, no peripheral edema  MS: moves all extremities without noticeable abnormality  PSYCH: pleasant and cooperative, no obvious depression or anxiety  ASSESSMENT AND PLAN:  Discussed the following assessment and plan:  Rhinosinusitis  Cough - Plan: DG Abd 2 Views  Wheezing - Plan: DG  Abd 2 Views  -given HPI and exam findings today, a serious infection or illness is unlikely. We discussed potential etiologies, with VURI being most likely, and advised supportive care and monitoring. We discussed treatment side effects, likely course, antibiotic misuse, transmission, and signs of developing a serious illness. -O2 sats great, afebrile, looks like virla or allergic URI on exam, CXR as she is now worried about pneumonia after her visit with the allergist -of course, we advised to return or notify a doctor immediately if symptoms worsen or persist or new concerns arise.    Patient Instructions  -afrin for 4 days  -allergy treatments per allergist  -get the chest xray  -if worsening or not improving see your asthma or allergist     Colin Benton R.

## 2013-08-22 NOTE — Progress Notes (Signed)
Pre visit review using our clinic review tool, if applicable. No additional management support is needed unless otherwise documented below in the visit note. 

## 2014-02-07 ENCOUNTER — Emergency Department (HOSPITAL_COMMUNITY)
Admission: EM | Admit: 2014-02-07 | Discharge: 2014-02-07 | Disposition: A | Discharge status: Home / Self Care | Payer: 59 | Attending: Emergency Medicine | Admitting: Emergency Medicine

## 2014-02-07 ENCOUNTER — Encounter (HOSPITAL_COMMUNITY): Payer: Self-pay | Admitting: *Deleted

## 2014-02-07 ENCOUNTER — Emergency Department (HOSPITAL_COMMUNITY): Payer: 59

## 2014-02-07 DIAGNOSIS — Y998 Other external cause status: Secondary | ICD-10-CM | POA: Insufficient documentation

## 2014-02-07 DIAGNOSIS — S99912A Unspecified injury of left ankle, initial encounter: Secondary | ICD-10-CM | POA: Insufficient documentation

## 2014-02-07 DIAGNOSIS — Y9389 Activity, other specified: Secondary | ICD-10-CM | POA: Insufficient documentation

## 2014-02-07 DIAGNOSIS — T1490XA Injury, unspecified, initial encounter: Secondary | ICD-10-CM

## 2014-02-07 DIAGNOSIS — S99922A Unspecified injury of left foot, initial encounter: Secondary | ICD-10-CM | POA: Diagnosis present

## 2014-02-07 DIAGNOSIS — F329 Major depressive disorder, single episode, unspecified: Secondary | ICD-10-CM | POA: Diagnosis not present

## 2014-02-07 DIAGNOSIS — Z8719 Personal history of other diseases of the digestive system: Secondary | ICD-10-CM | POA: Insufficient documentation

## 2014-02-07 DIAGNOSIS — Z8709 Personal history of other diseases of the respiratory system: Secondary | ICD-10-CM | POA: Diagnosis not present

## 2014-02-07 DIAGNOSIS — S93602A Unspecified sprain of left foot, initial encounter: Secondary | ICD-10-CM | POA: Diagnosis not present

## 2014-02-07 DIAGNOSIS — W1839XA Other fall on same level, initial encounter: Secondary | ICD-10-CM | POA: Insufficient documentation

## 2014-02-07 DIAGNOSIS — Y9289 Other specified places as the place of occurrence of the external cause: Secondary | ICD-10-CM | POA: Insufficient documentation

## 2014-02-07 DIAGNOSIS — Z720 Tobacco use: Secondary | ICD-10-CM | POA: Insufficient documentation

## 2014-02-07 DIAGNOSIS — G43909 Migraine, unspecified, not intractable, without status migrainosus: Secondary | ICD-10-CM | POA: Insufficient documentation

## 2014-02-07 DIAGNOSIS — Z79899 Other long term (current) drug therapy: Secondary | ICD-10-CM | POA: Insufficient documentation

## 2014-02-07 DIAGNOSIS — Z87440 Personal history of urinary (tract) infections: Secondary | ICD-10-CM | POA: Diagnosis not present

## 2014-02-07 MED ORDER — TRAMADOL HCL 50 MG PO TABS: 50.0000 mg | ORAL_TABLET | Freq: Four times a day (QID) | ORAL | Status: DC | PRN

## 2014-02-07 NOTE — ED Provider Notes (Signed)
CSN: [id number]     Arrival date & time 02/07/14  1941 History  This chart was scribed for non-physician practitioner, Junius Creamer, NP-C working with Blanchie Dessert, MD, by Chester Holstein, ED Scribe. This patient was seen in room WTR8/WTR8 and the patient's care was started at 8:23 PM.     Chief Complaint  Patient presents with  . Foot Injury    The history is provided by the patient. No language interpreter was used.    HPI Comments: Betty Leonard is a 34 y.o. female who presents to the Emergency Department complaining of foot injury with onset at 6 PM tonight. Pt notes she was wearing heels in a pet store.  Pt notes snow outside and states store floor was slippery causing her to fall. Pt denies taking medication for relief.  Pt has PMHx of surgery on same foot in 2009. Pt denies swelling or any other injury.   Past Medical History  Diagnosis Date  . GERD (gastroesophageal reflux disease)   . Migraines   . Depression   . Esophagitis 12/24/2011  . Hemorrhage of rectum and anus 12/16/2011  . Nausea with vomiting, chronic - followed by Dr. Deatra Ina and GI 12/16/2011  . Alcohol abuse   . Chicken pox   . Frequent headaches   . Seasonal allergies   . Migraines   . UTI (urinary tract infection)    Past Surgical History  Procedure Laterality Date  . Foot surgery    . Esophagogastroduodenoscopy  12/24/2011    Procedure: ESOPHAGOGASTRODUODENOSCOPY (EGD);  Surgeon: Inda Castle, MD;  Location: [facility];  Service: Endoscopy;  Laterality: N/A;   Family History  Problem Relation Age of Onset  . Colon cancer Neg Hx   . Leukemia Paternal Grandfather   . Lung cancer Paternal Grandfather   . Arthritis Paternal Grandfather   . Diabetes Paternal Grandfather   . Heart disease Paternal Grandfather   . Hypertension Paternal Grandfather   . Kidney disease Paternal Grandfather   . Prostate cancer Paternal Grandfather   . Gaucher's disease Paternal Grandfather   . Sudden death Father      age 35, nothing found on autopsy  . Diabetes Father   . Hyperlipidemia Father   . Hypertension Father   . Mental illness Mother     bipolar, schizophrenia  . Alcohol abuse Maternal Grandfather   . Stroke Paternal Grandmother    History  Substance Use Topics  . Smoking status: Current Every Day Smoker -- 0.50 packs/day    Types: Cigarettes  . Smokeless tobacco: Never Used     Comment: per patient 4 to 5 cigarettes a day   . Alcohol Use: Yes     Comment: one drink once a month    OB History    No data available     Review of Systems  Musculoskeletal: Positive for arthralgias. Negative for joint swelling.  Skin: Negative for wound.  All other systems reviewed and are negative.     Allergies  Codeine and Aspirin  Home Medications   Prior to Admission medications   Medication Sig Start Date End Date Taking? Authorizing Provider  baclofen (LIORESAL) 10 MG tablet Take 10 mg by mouth as needed for muscle spasms. Takes as directed 12/09/11  Yes Historical Provider, MD  ibuprofen (ADVIL,MOTRIN) 200 MG tablet Take 800 mg by mouth every 6 (six) hours as needed for moderate pain or cramping (for pain).    Yes Historical Provider, MD  topiramate (TOPAMAX) 100 MG tablet  Take 100 mg by mouth daily.   Yes Historical Provider, MD  traMADol (ULTRAM) 50 MG tablet Take 1 tablet (50 mg total) by mouth every 6 (six) hours as needed. 02/07/14   Garald Balding, NP   BP 119/78 mmHg  Pulse 106  Temp(Src) 98.3 F (36.8 C) (Oral)  Resp 20  SpO2 100%  LMP 02/06/2014 Physical Exam  Constitutional: She is oriented to person, place, and time. She appears well-developed and well-nourished.  HENT:  Head: Normocephalic.  Eyes: Pupils are equal, round, and reactive to light.  Neck: Normal range of motion.  Cardiovascular: Normal rate and regular rhythm.   Pulmonary/Chest: Effort normal.  Musculoskeletal: She exhibits tenderness. She exhibits no edema.       Left ankle: She exhibits decreased range  of motion. She exhibits no swelling, no ecchymosis, no deformity, no laceration and normal pulse. Tenderness.       Feet:  Neurological: She is alert and oriented to person, place, and time.  Skin: Skin is warm and dry.  Psychiatric: She has a normal mood and affect. Her behavior is normal.  Nursing note and vitals reviewed.   ED Course  Procedures (including critical care time) DIAGNOSTIC STUDIES: Oxygen Saturation is 100% on room air, normal by my interpretation.    COORDINATION OF CARE: 8:25 PM Discussed treatment plan with patient at beside, the patient agrees with the plan and has no further questions at this time.   Labs Review Labs Reviewed - No data to display  Imaging Review Dg Ankle Complete Right  02/07/2014   CLINICAL DATA:  Right ankle injury.  Pain.  EXAM: RIGHT ANKLE - COMPLETE 3+ VIEW  COMPARISON:  09/23/2011  FINDINGS: No fracture dislocation. No ankle joint arthropathic change reason seen calcaneal tuberosity likely is due to remote surgery. Screws have been inserted at the base of the first metatarsal, well-seated.  Soft tissues are unremarkable.  IMPRESSION: No fracture or acute finding.   Electronically Signed   By: Lajean Manes M.D.   On: 02/07/2014 20:26   Dg Foot Complete Right  02/07/2014   CLINICAL DATA:  Patient slipped on wet floor.  Pain medially  EXAM: RIGHT FOOT COMPLETE - 3+ VIEW  COMPARISON:  None.  FINDINGS: Frontal, oblique, and lateral views were obtained. There is screw and plate fixation in the proximal first metatarsal as well as screw fixation in the second and third distal metatarsals. There is no acute fracture or dislocation. There is slight narrowing of the fourth and fifth DIP joints. No erosive change.  IMPRESSION: Areas of prior surgery. Narrowing fourth and fifth DIP joints. No fracture or dislocation.   Electronically Signed   By: Lowella Grip M.D.   On: 02/07/2014 20:26     EKG Interpretation None      MDM   Final diagnoses:   Foot sprain, left, initial encounter    I personally performed the services described in this documentation, which was scribed in my presence. The recorded information has been reviewed and is accurate.   Garald Balding, NP 02/07/14 2043  Blanchie Dessert, MD 02/08/14 [phone]

## 2014-02-07 NOTE — Discharge Instructions (Signed)
Cryotherapy °Cryotherapy means treatment with cold. Ice or gel packs can be used to reduce both pain and swelling. Ice is the most helpful within the first 24 to 48 hours after an injury or flare-up from overusing a muscle or joint. Sprains, strains, spasms, burning pain, shooting pain, and aches can all be eased with ice. Ice can also be used when recovering from surgery. Ice is effective, has very few side effects, and is safe for most people to use. °PRECAUTIONS  °Ice is not a safe treatment option for people with: °· Raynaud phenomenon. This is a condition affecting small blood vessels in the extremities. Exposure to cold may cause your problems to return. °· Cold hypersensitivity. There are many forms of cold hypersensitivity, including: °¨ Cold urticaria. Red, itchy hives appear on the skin when the tissues begin to warm after being iced. °¨ Cold erythema. This is a red, itchy rash caused by exposure to cold. °¨ Cold hemoglobinuria. Red blood cells break down when the tissues begin to warm after being iced. The hemoglobin that carry oxygen are passed into the urine because they cannot combine with blood proteins fast enough. °· Numbness or altered sensitivity in the area being iced. °If you have any of the following conditions, do not use ice until you have discussed cryotherapy with your caregiver: °· Heart conditions, such as arrhythmia, angina, or chronic heart disease. °· High blood pressure. °· Healing wounds or open skin in the area being iced. °· Current infections. °· Rheumatoid arthritis. °· Poor circulation. °· Diabetes. °Ice slows the blood flow in the region it is applied. This is beneficial when trying to stop inflamed tissues from spreading irritating chemicals to surrounding tissues. However, if you expose your skin to cold temperatures for too long or without the proper protection, you can damage your skin or nerves. Watch for signs of skin damage due to cold. °HOME CARE INSTRUCTIONS °Follow  these tips to use ice and cold packs safely. °· Place a dry or damp towel between the ice and skin. A damp towel will cool the skin more quickly, so you may need to shorten the time that the ice is used. °· For a more rapid response, add gentle compression to the ice. °· Ice for no more than 10 to 20 minutes at a time. The bonier the area you are icing, the less time it will take to get the benefits of ice. °· Check your skin after 5 minutes to make sure there are no signs of a poor response to cold or skin damage. °· Rest 20 minutes or more between uses. °· Once your skin is numb, you can end your treatment. You can test numbness by very lightly touching your skin. The touch should be so light that you do not see the skin dimple from the pressure of your fingertip. When using ice, most people will feel these normal sensations in this order: cold, burning, aching, and numbness. °· Do not use ice on someone who cannot communicate their responses to pain, such as small children or people with dementia. °HOW TO MAKE AN ICE PACK °Ice packs are the most common way to use ice therapy. Other methods include ice massage, ice baths, and cryosprays. Muscle creams that cause a cold, tingly feeling do not offer the same benefits that ice offers and should not be used as a substitute unless recommended by your caregiver. °To make an ice pack, do one of the following: °· Place crushed ice or a   bag of frozen vegetables in a sealable plastic bag. Squeeze out the excess air. Place this bag inside another plastic bag. Slide the bag into a pillowcase or place a damp towel between your skin and the bag.  Mix 3 parts water with 1 part rubbing alcohol. Freeze the mixture in a sealable plastic bag. When you remove the mixture from the freezer, it will be slushy. Squeeze out the excess air. Place this bag inside another plastic bag. Slide the bag into a pillowcase or place a damp towel between your skin and the bag. SEEK MEDICAL CARE  IF:  You develop white spots on your skin. This may give the skin a blotchy (mottled) appearance.  Your skin turns blue or pale.  Your skin becomes waxy or hard.  Your swelling gets worse. MAKE SURE YOU:   Understand these instructions.  Will watch your condition.  Will get help right away if you are not doing well or get worse. Document Released: 09/01/2010 Document Revised: 05/22/2013 Document Reviewed: 09/01/2010 [facility] Patient Information 2015 [place], [place]. This information is not intended to replace advice given to you by your health care provider. Make sure you discuss any questions you have with your health care provider. Your xray shows no fractures

## 2014-02-07 NOTE — ED Notes (Signed)
Pt states that she was wearing high heels in the store during the snow and fell; pt c/o rt foot and ankle pain; no obvious swelling or deformity; pt with hx of surgery to same foot

## 2014-03-01 ENCOUNTER — Other Ambulatory Visit: Payer: Self-pay | Admitting: Orthopedic Surgery

## 2014-03-01 DIAGNOSIS — R609 Edema, unspecified: Secondary | ICD-10-CM

## 2014-03-01 DIAGNOSIS — R52 Pain, unspecified: Secondary | ICD-10-CM

## 2014-03-10 ENCOUNTER — Ambulatory Visit
Admission: RE | Admit: 2014-03-10 | Discharge: 2014-03-10 | Disposition: A | Discharge status: Home / Self Care | Payer: 59 | Source: Ambulatory Visit | Attending: Orthopedic Surgery | Admitting: Orthopedic Surgery

## 2014-03-10 DIAGNOSIS — R609 Edema, unspecified: Secondary | ICD-10-CM

## 2014-03-10 DIAGNOSIS — R52 Pain, unspecified: Secondary | ICD-10-CM

## 2014-06-07 ENCOUNTER — Ambulatory Visit (INDEPENDENT_AMBULATORY_CARE_PROVIDER_SITE_OTHER): Payer: 59 | Admitting: Physician Assistant

## 2014-06-07 VITALS — BP 110/60 | HR 90 | Temp 98.5°F | Ht 66.75 in | Wt 202.0 lb

## 2014-06-07 DIAGNOSIS — J302 Other seasonal allergic rhinitis: Secondary | ICD-10-CM | POA: Diagnosis not present

## 2014-06-07 DIAGNOSIS — J0100 Acute maxillary sinusitis, unspecified: Secondary | ICD-10-CM | POA: Diagnosis not present

## 2014-06-07 MED ORDER — CETIRIZINE HCL 10 MG PO TABS: 10.0000 mg | ORAL_TABLET | Freq: Every day | ORAL | Status: DC

## 2014-06-07 MED ORDER — IPRATROPIUM BROMIDE 0.03 % NA SOLN: 2.0000 | Freq: Two times a day (BID) | NASAL | Status: DC

## 2014-06-07 NOTE — Progress Notes (Signed)
   Subjective:    Patient ID: Betty Leonard, female    DOB: 1981-01-14, 34 y.o.   MRN: [id number]  HPI Patient presents for sinus pressure that has been present for past 3 days and is occupanied with ear pain, congestion, rhinorrhea, post nasal drip, sore throat, itchy eyes, HA, and dry cough. Denies fever and N/V. Has tried ibuprofen, mucinex, and otc cold meds. No known sick contacts. H/o seasonal allergies, but not asthma. Smokes 1/2 pack daily. Med allergy to codeine and aspirin.    Review of Systems As noted above.    Objective:   Physical Exam  Constitutional: She is oriented to person, place, and time. She appears well-developed and well-nourished. No distress.  Blood pressure 110/60, pulse 90, temperature 98.5 F (36.9 C), temperature source Oral, height 5' 6.75" (1.695 m), weight 202 lb (91.627 kg), SpO2 98 %.   HENT:  Head: Normocephalic and atraumatic.  Right Ear: Tympanic membrane, external ear and ear canal normal.  Left Ear: Tympanic membrane, external ear and ear canal normal.  Nose: Rhinorrhea (with erythema) present. Right sinus exhibits maxillary sinus tenderness. Right sinus exhibits no frontal sinus tenderness. Left sinus exhibits maxillary sinus tenderness. Left sinus exhibits no frontal sinus tenderness.  Mouth/Throat: Uvula is midline, oropharynx is clear and moist and mucous membranes are normal. No oropharyngeal exudate.  Eyes: Conjunctivae are normal. Pupils are equal, round, and reactive to light. Right eye exhibits no discharge. Left eye exhibits no discharge. No scleral icterus.  Neck: Normal range of motion. Neck supple. No thyromegaly present.  Cardiovascular: Normal rate, regular rhythm and normal heart sounds.  Exam reveals no gallop and no friction rub.   No murmur heard. Pulmonary/Chest: Effort normal and breath sounds normal. No respiratory distress. She has no decreased breath sounds. She has no wheezes. She has no rhonchi. She has no rales.    Abdominal: Soft. Bowel sounds are normal. She exhibits no distension. There is no tenderness. There is no rebound and no guarding.  Lymphadenopathy:    She has cervical adenopathy.  Neurological: She is alert and oriented to person, place, and time.  Skin: Skin is warm and dry. No rash noted. She is not diaphoretic. No erythema.      Assessment & Plan:  1. Acute maxillary sinusitis, recurrence not specified Can continue tessalson and ibuprofen for cough and pain sx. Take mucinex with plenty of water. - ipratropium (ATROVENT) 0.03 % nasal spray; Place 2 sprays into both nostrils 2 (two) times daily.  Dispense: 30 mL; Refill: 0 - cetirizine (ZYRTEC) 10 MG tablet; Take 1 tablet (10 mg total) by mouth daily.  Dispense: 30 tablet; Refill: 11  2. Seasonal allergies - cetirizine (ZYRTEC) 10 MG tablet; Take 1 tablet (10 mg total) by mouth daily.  Dispense: 30 tablet; Refill: [place] PA-C  [facility] 06/07/2014 7:09 PM

## 2014-06-07 NOTE — Patient Instructions (Signed)
Continue tessalon for cough.  Sinusitis Sinusitis is redness, soreness, and inflammation of the paranasal sinuses. Paranasal sinuses are air pockets within the bones of your face (beneath the eyes, the middle of the forehead, or above the eyes). In healthy paranasal sinuses, mucus is able to drain out, and air is able to circulate through them by way of your nose. However, when your paranasal sinuses are inflamed, mucus and air can become trapped. This can allow bacteria and other germs to grow and cause infection. Sinusitis can develop quickly and last only a short time (acute) or continue over a long period (chronic). Sinusitis that lasts for more than 12 weeks is considered chronic.  CAUSES  Causes of sinusitis include:  Allergies.  Structural abnormalities, such as displacement of the cartilage that separates your nostrils (deviated septum), which can decrease the air flow through your nose and sinuses and affect sinus drainage.  Functional abnormalities, such as when the small hairs (cilia) that line your sinuses and help remove mucus do not work properly or are not present. SIGNS AND SYMPTOMS  Symptoms of acute and chronic sinusitis are the same. The primary symptoms are pain and pressure around the affected sinuses. Other symptoms include:  Upper toothache.  Earache.  Headache.  Bad breath.  Decreased sense of smell and taste.  A cough, which worsens when you are lying flat.  Fatigue.  Fever.  Thick drainage from your nose, which often is green and may contain pus (purulent).  Swelling and warmth over the affected sinuses. DIAGNOSIS  Your health care provider will perform a physical exam. During the exam, your health care provider may:  Look in your nose for signs of abnormal growths in your nostrils (nasal polyps).  Tap over the affected sinus to check for signs of infection.  View the inside of your sinuses (endoscopy) using an imaging device that has a light  attached (endoscope). If your health care provider suspects that you have chronic sinusitis, one or more of the following tests may be recommended:  Allergy tests.  Nasal culture. A sample of mucus is taken from your nose, sent to a lab, and screened for bacteria.  Nasal cytology. A sample of mucus is taken from your nose and examined by your health care provider to determine if your sinusitis is related to an allergy. TREATMENT  Most cases of acute sinusitis are related to a viral infection and will resolve on their own within 10 days. Sometimes medicines are prescribed to help relieve symptoms (pain medicine, decongestants, nasal steroid sprays, or saline sprays).  However, for sinusitis related to a bacterial infection, your health care provider will prescribe antibiotic medicines. These are medicines that will help kill the bacteria causing the infection.  Rarely, sinusitis is caused by a fungal infection. In theses cases, your health care provider will prescribe antifungal medicine. For some cases of chronic sinusitis, surgery is needed. Generally, these are cases in which sinusitis recurs more than 3 times per year, despite other treatments. HOME CARE INSTRUCTIONS   Drink plenty of water. Water helps thin the mucus so your sinuses can drain more easily.  Use a humidifier.  Inhale steam 3 to 4 times a day (for example, sit in the bathroom with the shower running).  Apply a warm, moist washcloth to your face 3 to 4 times a day, or as directed by your health care provider.  Use saline nasal sprays to help moisten and clean your sinuses.  Take medicines only as directed by  your health care provider.  If you were prescribed either an antibiotic or antifungal medicine, finish it all even if you start to feel better. SEEK IMMEDIATE MEDICAL CARE IF:  You have increasing pain or severe headaches.  You have nausea, vomiting, or drowsiness.  You have swelling around your face.  You  have vision problems.  You have a stiff neck.  You have difficulty breathing. MAKE SURE YOU:   Understand these instructions.  Will watch your condition.  Will get help right away if you are not doing well or get worse. Document Released: 01/05/2005 Document Revised: 05/22/2013 Document Reviewed: 01/20/2011 [facility] Patient Information 2015 [place], [place]. This information is not intended to replace advice given to you by your health care provider. Make sure you discuss any questions you have with your health care provider.

## 2014-06-30 ENCOUNTER — Encounter (HOSPITAL_COMMUNITY): Payer: Self-pay | Admitting: Emergency Medicine

## 2014-06-30 ENCOUNTER — Emergency Department (HOSPITAL_COMMUNITY)
Admission: EM | Admit: 2014-06-30 | Discharge: 2014-07-01 | Disposition: A | Discharge status: Home / Self Care | Payer: 59 | Attending: Emergency Medicine | Admitting: Emergency Medicine

## 2014-06-30 DIAGNOSIS — R519 Headache, unspecified: Secondary | ICD-10-CM

## 2014-06-30 DIAGNOSIS — Z8719 Personal history of other diseases of the digestive system: Secondary | ICD-10-CM | POA: Diagnosis not present

## 2014-06-30 DIAGNOSIS — Z87448 Personal history of other diseases of urinary system: Secondary | ICD-10-CM | POA: Insufficient documentation

## 2014-06-30 DIAGNOSIS — Z79899 Other long term (current) drug therapy: Secondary | ICD-10-CM | POA: Insufficient documentation

## 2014-06-30 DIAGNOSIS — G43909 Migraine, unspecified, not intractable, without status migrainosus: Secondary | ICD-10-CM | POA: Insufficient documentation

## 2014-06-30 DIAGNOSIS — R51 Headache: Secondary | ICD-10-CM | POA: Diagnosis present

## 2014-06-30 DIAGNOSIS — Z8619 Personal history of other infectious and parasitic diseases: Secondary | ICD-10-CM | POA: Diagnosis not present

## 2014-06-30 DIAGNOSIS — F329 Major depressive disorder, single episode, unspecified: Secondary | ICD-10-CM | POA: Insufficient documentation

## 2014-06-30 DIAGNOSIS — R200 Anesthesia of skin: Secondary | ICD-10-CM | POA: Insufficient documentation

## 2014-06-30 DIAGNOSIS — Z720 Tobacco use: Secondary | ICD-10-CM | POA: Insufficient documentation

## 2014-06-30 MED ORDER — DIPHENHYDRAMINE HCL 50 MG/ML IJ SOLN
25.0000 mg | Freq: Once | INTRAMUSCULAR | Status: AC
  Administered 2014-06-30: 25 mg via INTRAVENOUS
  Filled 2014-06-30: qty 1

## 2014-06-30 MED ORDER — SODIUM CHLORIDE 0.9 % IV BOLUS (SEPSIS)
1000.0000 mL | INTRAVENOUS | Status: AC
  Administered 2014-06-30: 1000 mL via INTRAVENOUS

## 2014-06-30 MED ORDER — METOCLOPRAMIDE HCL 5 MG/ML IJ SOLN
10.0000 mg | Freq: Once | INTRAMUSCULAR | Status: AC
  Administered 2014-06-30: 10 mg via INTRAVENOUS
  Filled 2014-06-30: qty 2

## 2014-06-30 MED ORDER — KETOROLAC TROMETHAMINE 30 MG/ML IJ SOLN
30.0000 mg | Freq: Once | INTRAMUSCULAR | Status: DC
  Filled 2014-06-30: qty 1

## 2014-06-30 NOTE — ED Notes (Signed)
Pt from home reports having a history of migraines. She stopped taking Topamax about 1 year ago and this is the first time she has had symptoms since then. She reports having tingling in both arms, sensitivity to light, and emesis.

## 2014-06-30 NOTE — ED Notes (Signed)
Pt states she took baclofen for her migraine around 2000 and states the pain comes in waves

## 2014-06-30 NOTE — ED Provider Notes (Signed)
CSN: [id number]     Arrival date & time 06/30/14  2300 History   First MD Initiated Contact with Patient 06/30/14 2313     Chief Complaint  Patient presents with  . Migraine  . Emesis  . Numbness     (Consider location/radiation/quality/duration/timing/severity/associated sxs/prior Treatment) HPI Comments: Patient with past medical history of migraines presents to the emergency department with chief complaint of headache. She states the headache started today at around 7:00. She states that the headache has been coming and going in waves. She states that she took some baclofen and is starting to have some relief. Currently she states that her pain is mild, but she still has some light sensitivity and some sensation of tingling in her upper extremities. She denies any vision loss, or weakness. She denies any fevers, chills, or ataxia. She reports some associated nausea, light sensitivity, and vomiting.  The history is provided by the patient. No language interpreter was used.    Past Medical History  Diagnosis Date  . GERD (gastroesophageal reflux disease)   . Migraines   . Depression   . Esophagitis 12/24/2011  . Hemorrhage of rectum and anus 12/16/2011  . Nausea with vomiting, chronic - followed by Dr. Deatra Ina and GI 12/16/2011  . Alcohol abuse   . Chicken pox   . Frequent headaches   . Seasonal allergies   . Migraines   . UTI (urinary tract infection)    Past Surgical History  Procedure Laterality Date  . Foot surgery    . Esophagogastroduodenoscopy  12/24/2011    Procedure: ESOPHAGOGASTRODUODENOSCOPY (EGD);  Surgeon: Inda Castle, MD;  Location: [facility];  Service: Endoscopy;  Laterality: N/A;   Family History  Problem Relation Age of Onset  . Colon cancer Neg Hx   . Leukemia Paternal Grandfather   . Lung cancer Paternal Grandfather   . Arthritis Paternal Grandfather   . Diabetes Paternal Grandfather   . Heart disease Paternal Grandfather   . Hypertension Paternal  Grandfather   . Kidney disease Paternal Grandfather   . Prostate cancer Paternal Grandfather   . Gaucher's disease Paternal Grandfather   . Sudden death Father     age 30, nothing found on autopsy  . Diabetes Father   . Hyperlipidemia Father   . Hypertension Father   . Mental illness Mother     bipolar, schizophrenia  . Alcohol abuse Maternal Grandfather   . Stroke Paternal Grandmother    History  Substance Use Topics  . Smoking status: Current Every Day Smoker -- 0.50 packs/day for 5 years    Types: Cigarettes  . Smokeless tobacco: Never Used     Comment: per patient 4 to 5 cigarettes a day   . Alcohol Use: 0.0 oz/week    0 Standard drinks or equivalent per week     Comment: one drink once a month    OB History    No data available     Review of Systems  Constitutional: Negative for fever and chills.  Respiratory: Negative for shortness of breath.   Cardiovascular: Negative for chest pain.  Gastrointestinal: Negative for nausea, vomiting, diarrhea and constipation.  Genitourinary: Negative for dysuria.  Neurological: Positive for headaches.      Allergies  Codeine; Shellfish allergy; and Aspirin  Home Medications   Prior to Admission medications   Medication Sig Start Date End Date Taking? Authorizing Provider  baclofen (LIORESAL) 10 MG tablet Take 10 mg by mouth as needed for muscle spasms. Takes as directed  12/09/11   Historical Provider, MD  busPIRone (BUSPAR) 15 MG tablet Take 15 mg by mouth daily as needed. 05/25/14   Historical Provider, MD  cetirizine (ZYRTEC) 10 MG tablet Take 1 tablet (10 mg total) by mouth daily. 06/07/14   Tishira R Brewington, PA-C  EPINEPHrine 0.3 mg/0.3 mL IJ SOAJ injection Inject 0.3 mg into the muscle once.    Historical Provider, MD  FLUoxetine (PROZAC) 20 MG capsule Take 20 mg by mouth daily. 05/25/14   Historical Provider, MD  ibuprofen (ADVIL,MOTRIN) 200 MG tablet Take 800 mg by mouth every 6 (six) hours as needed for moderate pain  or cramping (for pain).     Historical Provider, MD  ipratropium (ATROVENT) 0.03 % nasal spray Place 2 sprays into both nostrils 2 (two) times daily. 06/07/14   Tishira R Brewington, PA-C  topiramate (TOPAMAX) 100 MG tablet Take 100 mg by mouth daily.    Historical Provider, MD   BP 121/79 mmHg  Pulse 72  Temp(Src) 97.8 F (36.6 C) (Oral)  Resp 16  Ht 5\' 6"  (1.676 m)  Wt 202 lb (91.627 kg)  BMI 32.62 kg/m2  SpO2 98% Physical Exam  Constitutional: She is oriented to person, place, and time. She appears well-developed and well-nourished.  HENT:  Head: Normocephalic and atraumatic.  Right Ear: External ear normal.  Left Ear: External ear normal.  Eyes: Conjunctivae and EOM are normal. Pupils are equal, round, and reactive to light.  Neck: Normal range of motion. Neck supple.  No pain with neck flexion, no meningismus  Cardiovascular: Normal rate, regular rhythm and normal heart sounds.  Exam reveals no gallop and no friction rub.   No murmur heard. Pulmonary/Chest: Effort normal and breath sounds normal. No respiratory distress. She has no wheezes. She has no rales. She exhibits no tenderness.  Abdominal: Soft. She exhibits no distension and no mass. There is no tenderness. There is no rebound and no guarding.  Musculoskeletal: Normal range of motion. She exhibits no edema or tenderness.  Normal gait.  Neurological: She is alert and oriented to person, place, and time. She has normal reflexes.  CN 3-12 intact, normal finger to nose, no pronator drift, sensation and strength intact bilaterally.  Skin: Skin is warm and dry.  Psychiatric: She has a normal mood and affect. Her behavior is normal. Judgment and thought content normal.  Nursing note and vitals reviewed.   ED Course  Procedures (including critical care time) Labs Review Labs Reviewed - No data to display  Imaging Review No results found.   EKG Interpretation None      MDM   Final diagnoses:  Headache,  unspecified headache type    Patient seen by and discussed with Dr. Lita Mains, who recommends symptomatic treatment and probable discharge.  Patient likely has some component of peripheral vertigo and has had URI symptoms recently.  Will give some meclizine.  Will give fluids.    Pt HA treated and improved while in ED.  Presentation is like pts typical HA and non concerning for [facility], ICH, Meningitis, or temporal arteritis. Pt is afebrile with no focal neuro deficits, nuchal rigidity, or change in vision. Pt is to follow up with PCP to discuss prophylactic medication. Pt verbalizes understanding and is agreeable with plan to dc.   12:33 AM Patient reassessed and is ready to go home.  Feels well.    Montine Circle, PA-C 07/01/14 0033  Julianne Rice, MD 07/01/14 [id number]

## 2014-07-01 MED ORDER — MECLIZINE HCL 25 MG PO TABS: 25.0000 mg | ORAL_TABLET | Freq: Once | ORAL | Status: DC

## 2014-07-01 MED ORDER — MECLIZINE HCL 50 MG PO TABS: 50.0000 mg | ORAL_TABLET | Freq: Two times a day (BID) | ORAL | Status: DC

## 2014-07-01 NOTE — Discharge Instructions (Signed)

## 2014-07-12 ENCOUNTER — Ambulatory Visit (INDEPENDENT_AMBULATORY_CARE_PROVIDER_SITE_OTHER): Payer: 59 | Admitting: Physician Assistant

## 2014-07-12 VITALS — BP 100/58 | HR 77 | Temp 98.2°F | Resp 16 | Ht 66.0 in | Wt 205.0 lb

## 2014-07-12 DIAGNOSIS — J3489 Other specified disorders of nose and nasal sinuses: Secondary | ICD-10-CM

## 2014-07-12 DIAGNOSIS — J029 Acute pharyngitis, unspecified: Secondary | ICD-10-CM

## 2014-07-12 DIAGNOSIS — R0981 Nasal congestion: Secondary | ICD-10-CM

## 2014-07-12 LAB — POCT RAPID STREP A (OFFICE): Rapid Strep A Screen: NEGATIVE

## 2014-07-12 NOTE — Progress Notes (Signed)
Subjective:    Patient ID: Betty Leonard, female    DOB: [birth year], 34 y.o.   MRN: [id number]  Chief Complaint  Patient presents with  . Sore Throat    Onset yesterday  . Sinus drainage  . Ear Fullness   Patient Active Problem List   Diagnosis Date Noted  . Anxiety and depression 09/23/2012  . Hx of migraine headaches 09/23/2012  . Tobacco use disorder 09/23/2012  . Nausea with vomiting, chronic - followed by Dr. Deatra Ina and GI 12/16/2011   Prior to Admission medications   Medication Sig Start Date End Date Taking? Authorizing Provider  baclofen (LIORESAL) 10 MG tablet Take 10 mg by mouth as needed for muscle spasms. Takes as directed 12/09/11  Yes Historical Provider, MD  busPIRone (BUSPAR) 15 MG tablet Take 15 mg by mouth daily as needed (for panic attacks).  05/25/14  Yes Historical Provider, MD  cetirizine (ZYRTEC) 10 MG tablet Take 1 tablet (10 mg total) by mouth daily. 06/07/14  Yes Tishira R Brewington, PA-C  EPINEPHrine 0.3 mg/0.3 mL IJ SOAJ injection Inject 0.3 mg into the muscle once.   Yes Historical Provider, MD  FLUoxetine (PROZAC) 20 MG capsule Take 20 mg by mouth daily. 05/25/14  Yes Historical Provider, MD  ibuprofen (ADVIL,MOTRIN) 200 MG tablet Take 800 mg by mouth every 6 (six) hours as needed for moderate pain or cramping (for pain).    Yes Historical Provider, MD  topiramate (TOPAMAX) 25 MG capsule Take 25 mg by mouth daily.   Yes Historical Provider, MD  meclizine (ANTIVERT) 25 MG tablet TK 2 TS PO BID 07/01/14   Historical Provider, MD  topiramate (TOPAMAX) 25 MG tablet  07/10/14   Historical Provider, MD   Medications, allergies, past medical history, surgical history, family history, social history and problem list reviewed and updated.  HPI  34 yof with no sig pmh presents with st, congestion, rhinorrhea.   Sx started 3-4 days ago with st, head/nasal congestion, rhinorrhea, itchy/watery eyes. Ears feel congested. Denies sob, abd pain, n/v, diarrhea. Denies  fevers, chills. Was seen in ER and here in May and diag with viral uri/allergies, started on zyrtec and ipratropium nasal spray.   Today states she can't stand nasal sprays and doesn't use them. Has been taking zyrtec daily. Nothing else. She would like a swab for strep today.   Review of Systems See HPI.     Objective:   Physical Exam  Constitutional: She appears well-developed and well-nourished.  Non-toxic appearance. She does not have a sickly appearance. She does not appear ill. No distress.  BP 100/58 mmHg  Pulse 77  Temp(Src) 98.2 F (36.8 C) (Oral)  Resp 16  Ht 5\' 6"  (1.676 m)  Wt 205 lb (92.987 kg)  BMI 33.10 kg/m2  SpO2 98%  LMP 06/18/2014   HENT:  Right Ear: A middle ear effusion is present.  Left Ear: A middle ear effusion is present.  Nose: Mucosal edema and rhinorrhea present. Right sinus exhibits maxillary sinus tenderness. Right sinus exhibits no frontal sinus tenderness. Left sinus exhibits maxillary sinus tenderness. Left sinus exhibits no frontal sinus tenderness.  Mouth/Throat: Uvula is midline and mucous membranes are normal. Posterior oropharyngeal erythema present. No oropharyngeal exudate.  Mild uvular and posterior pharyngeal erythema.   Pulmonary/Chest: Effort normal and breath sounds normal. No tachypnea.  Lymphadenopathy:       Head (right side): Submandibular adenopathy present. No submental and no tonsillar adenopathy present.       Head (  left side): Submandibular adenopathy present. No submental and no tonsillar adenopathy present.    She has cervical adenopathy.       Right cervical: Superficial cervical adenopathy present. No deep cervical and no posterior cervical adenopathy present.      Left cervical: Superficial cervical adenopathy present. No deep cervical and no posterior cervical adenopathy present.   Results for orders placed or performed in visit on 07/12/14  POCT rapid strep A  Result Value Ref Range   Rapid Strep A Screen Negative  Negative      Assessment & Plan:   34 yof with no sig pmh presents with st, congestion, rhinorrhea.   Sore throat - Plan: POCT rapid strep A Head congestion Rhinorrhea --doubt bacterial at this time with normal vitals, only day 3-4 sx, relatively normal exam --strep swab neg --likely continued allergic rhinitis --> switch to zyrtec-d daily, mucinex bid, declines nasal sprays, tylenol prn for throat, humidifier at night  --if sx persist into weekend despite these measures will tx with antibiotics as she does have maxillary sinus ttp and head/neck LAN  Julieta Gutting, PA-C Physician Assistant-Certified [facility]  07/12/2014 9:23 AM

## 2014-07-12 NOTE — Patient Instructions (Signed)
Your strep swab was negative.  Please start taking zyrtec-d every day along with mucinex twice daily with lots of water. Tylenol for sore throat and sleeping with humidifier on may help.  If you feel bad into the weekend please give [place] a call.

## 2014-07-14 ENCOUNTER — Telehealth: Payer: Self-pay

## 2014-07-14 NOTE — Telephone Encounter (Signed)
Pt was seen on 6/23 by Carmel [place]. She is not feeling any better and was wondering if she could be put on an antibiotic. Please advise at [phone]

## 2014-07-16 MED ORDER — AMOXICILLIN-POT CLAVULANATE 875-125 MG PO TABS: 1.0000 | ORAL_TABLET | Freq: Two times a day (BID) | ORAL | Status: DC

## 2014-07-16 NOTE — Telephone Encounter (Signed)
Left message letting pt know her Rx was sent in.

## 2014-07-16 NOTE — Telephone Encounter (Signed)
Sending in augmentin. To take twice dialy for 10 days.

## 2014-07-16 NOTE — Telephone Encounter (Signed)
Sore throat - Plan: POCT rapid strep A Head congestion Rhinorrhea --doubt bacterial at this time with normal vitals, only day 3-4 sx, relatively normal exam --strep swab neg --likely continued allergic rhinitis --> switch to zyrtec-d daily, mucinex bid, declines nasal sprays, tylenol prn for throat, humidifier at night  --if sx persist into weekend despite these measures will tx with antibiotics as she does have maxillary sinus ttp and head/neck LAN

## 2014-07-17 ENCOUNTER — Encounter: Payer: Self-pay | Admitting: Physician Assistant

## 2015-01-16 ENCOUNTER — Ambulatory Visit (INDEPENDENT_AMBULATORY_CARE_PROVIDER_SITE_OTHER): Payer: Commercial Managed Care - HMO | Admitting: Family Medicine

## 2015-01-16 ENCOUNTER — Ambulatory Visit (INDEPENDENT_AMBULATORY_CARE_PROVIDER_SITE_OTHER): Payer: Commercial Managed Care - HMO

## 2015-01-16 VITALS — BP 118/80 | HR 88 | Temp 98.2°F | Resp 16 | Ht 67.0 in | Wt 232.0 lb

## 2015-01-16 DIAGNOSIS — R200 Anesthesia of skin: Secondary | ICD-10-CM

## 2015-01-16 DIAGNOSIS — M79645 Pain in left finger(s): Secondary | ICD-10-CM

## 2015-01-16 DIAGNOSIS — R202 Paresthesia of skin: Secondary | ICD-10-CM | POA: Diagnosis not present

## 2015-01-16 MED ORDER — DICLOFENAC SODIUM 1 % TD GEL: 2.0000 g | Freq: Four times a day (QID) | TRANSDERMAL | Status: DC
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
## 2015-01-31 ENCOUNTER — Telehealth: Payer: Self-pay

## 2015-01-31 DIAGNOSIS — M79646 Pain in unspecified finger(s): Secondary | ICD-10-CM

## 2015-01-31 NOTE — Telephone Encounter (Signed)
Patient wants to see a hand specialist and does remember the name of the specialist that Dr. Marin Comment suggested. Please call!

## 2015-01-31 NOTE — Telephone Encounter (Signed)
Dr. Marin Comment,  Do you remember who you suggested for a hand specialist?

## 2015-02-05 NOTE — Telephone Encounter (Signed)
Can you call her to let her know I have referred her to the [place], thanks

## 2015-02-05 NOTE — Telephone Encounter (Signed)
Spoke with pt, advised message from Dr. Marin Comment.

## 2016-01-20 HISTORY — PX: KNEE ARTHROSCOPY: SUR90

## 2016-02-03 IMAGING — CR DG FOOT COMPLETE 3+V*R*
3 series · 3 of 3 positions shown · non-contrast
Comparison: None.

CLINICAL DATA: Patient slipped on wet floor.  Pain medially

EXAM:
RIGHT FOOT COMPLETE - 3+ VIEW

[x foot ap right]
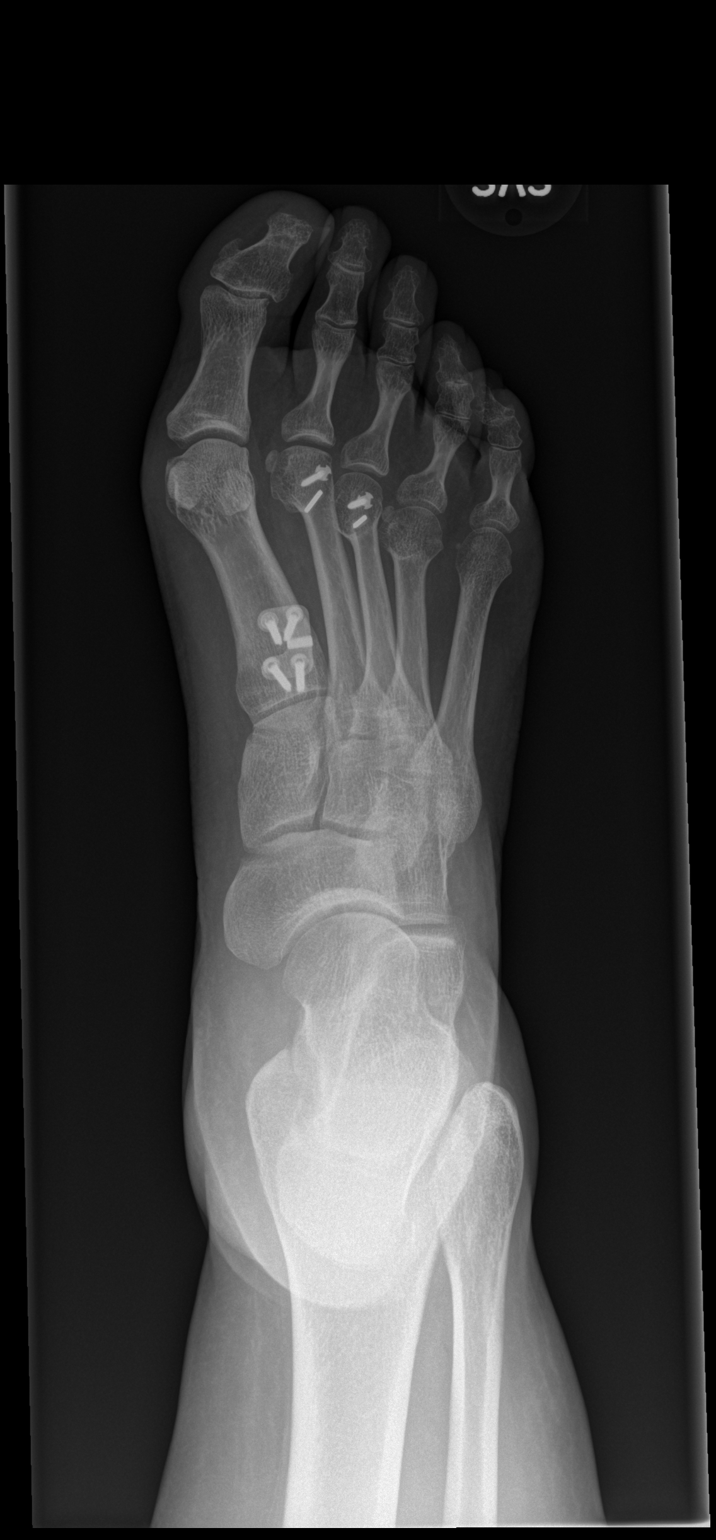

[x foot obl right]
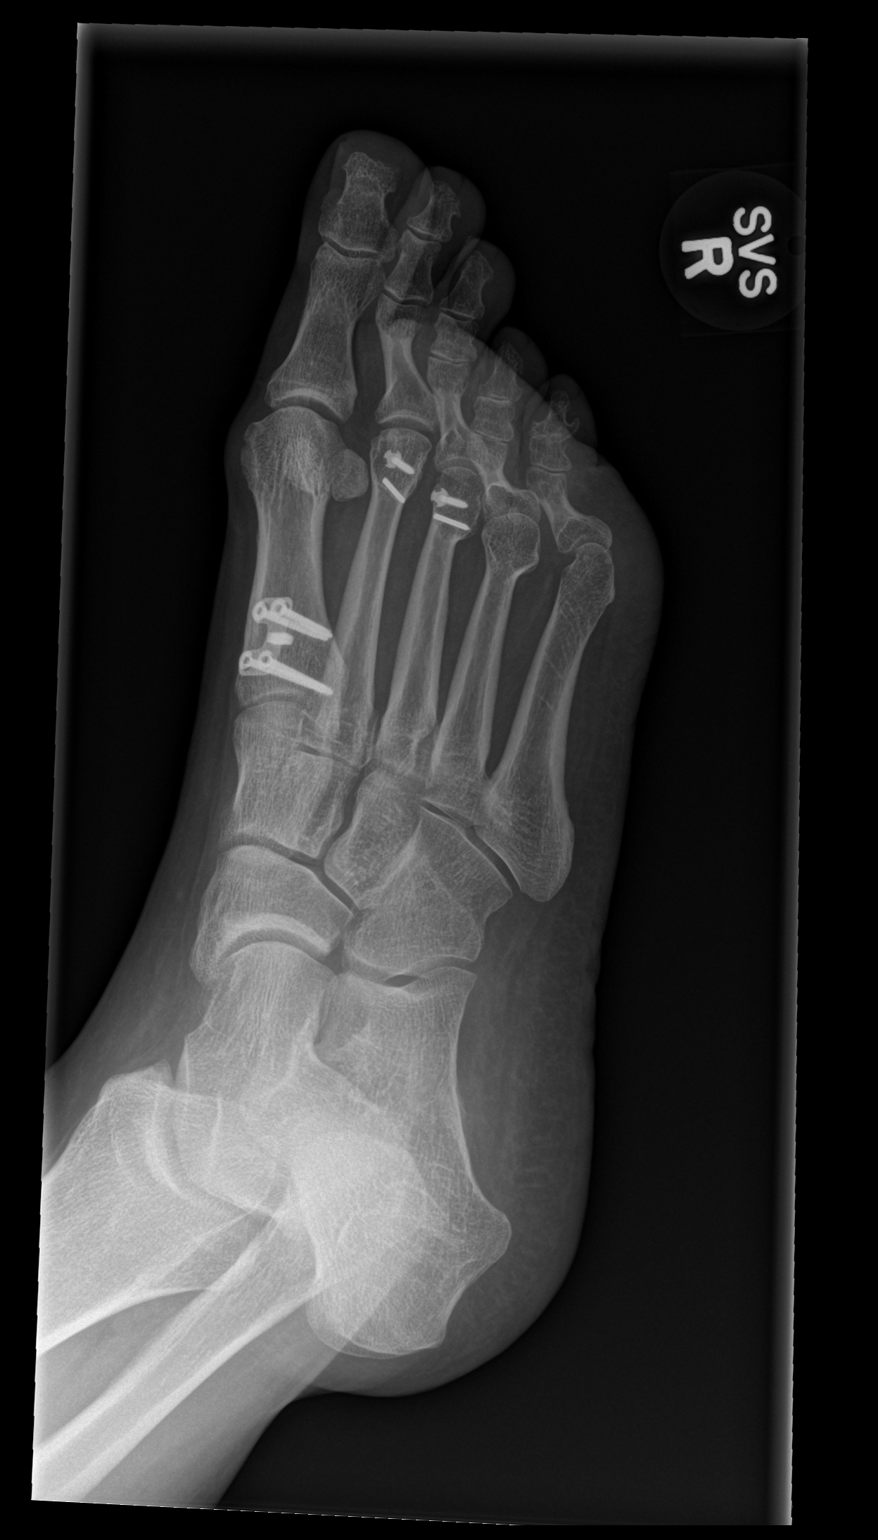

[x foot lat right]
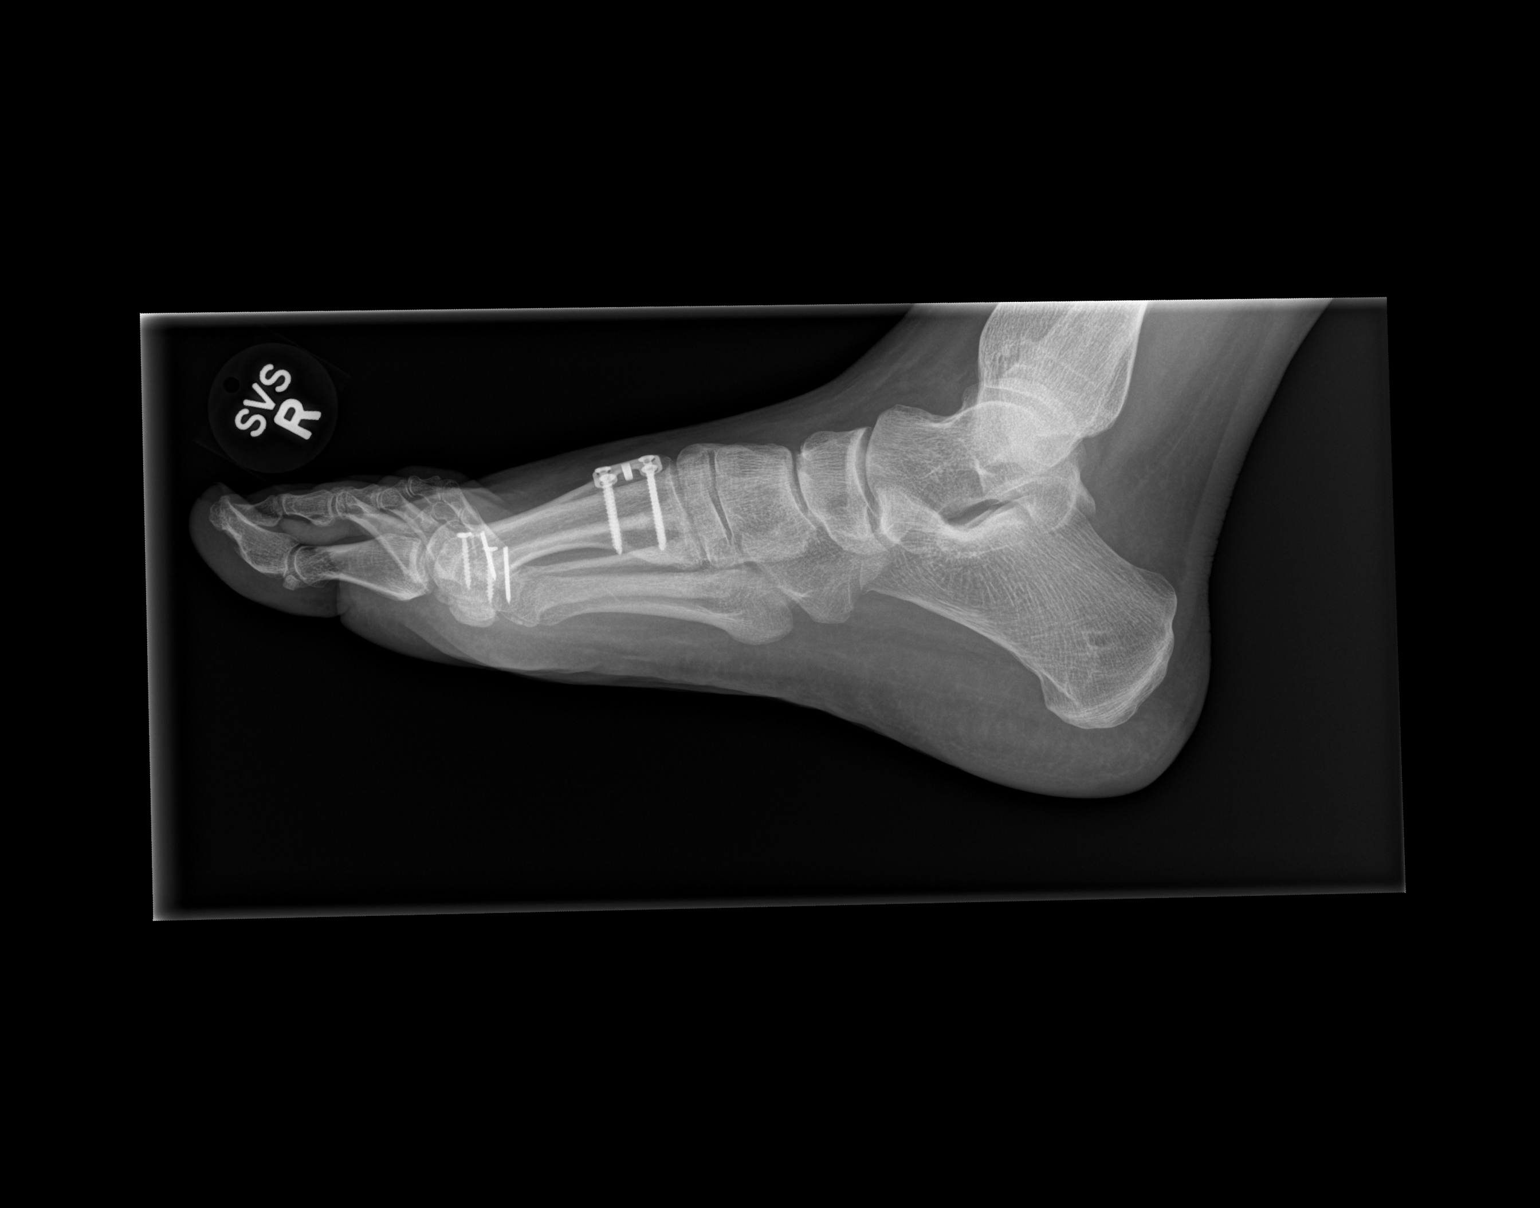

[3 of 3 positions shown; findings below may reference images not displayed]

FINDINGS: Frontal, oblique, and lateral views were obtained. There is screw
and plate fixation in the proximal first metatarsal as well as screw
fixation in the second and third distal metatarsals. There is no
acute fracture or dislocation. There is slight narrowing of the
fourth and fifth DIP joints. No erosive change.
IMPRESSION: Areas of prior surgery. Narrowing fourth and fifth DIP joints. No
fracture or dislocation.

## 2016-07-02 ENCOUNTER — Encounter (HOSPITAL_COMMUNITY): Payer: Self-pay | Admitting: Emergency Medicine

## 2016-07-02 ENCOUNTER — Emergency Department (HOSPITAL_COMMUNITY)
Admission: EM | Admit: 2016-07-02 | Discharge: 2016-07-02 | Disposition: A | Discharge status: Home / Self Care | Payer: No Typology Code available for payment source | Attending: Emergency Medicine | Admitting: Emergency Medicine

## 2016-07-02 DIAGNOSIS — S299XXA Unspecified injury of thorax, initial encounter: Secondary | ICD-10-CM | POA: Diagnosis present

## 2016-07-02 DIAGNOSIS — Y92481 Parking lot as the place of occurrence of the external cause: Secondary | ICD-10-CM | POA: Insufficient documentation

## 2016-07-02 DIAGNOSIS — Z79899 Other long term (current) drug therapy: Secondary | ICD-10-CM | POA: Insufficient documentation

## 2016-07-02 DIAGNOSIS — Y939 Activity, unspecified: Secondary | ICD-10-CM | POA: Diagnosis not present

## 2016-07-02 DIAGNOSIS — M79645 Pain in left finger(s): Secondary | ICD-10-CM | POA: Insufficient documentation

## 2016-07-02 DIAGNOSIS — M546 Pain in thoracic spine: Secondary | ICD-10-CM | POA: Diagnosis not present

## 2016-07-02 DIAGNOSIS — Y999 Unspecified external cause status: Secondary | ICD-10-CM | POA: Insufficient documentation

## 2016-07-02 DIAGNOSIS — F17210 Nicotine dependence, cigarettes, uncomplicated: Secondary | ICD-10-CM | POA: Diagnosis not present

## 2016-07-02 MED ORDER — CYCLOBENZAPRINE HCL 10 MG PO TABS: 10.0000 mg | ORAL_TABLET | Freq: Two times a day (BID) | ORAL | 0 refills | Status: DC | PRN

## 2016-07-02 MED ORDER — ACETAMINOPHEN 325 MG PO TABS
650.0000 mg | ORAL_TABLET | Freq: Once | ORAL | Status: AC
  Administered 2016-07-02: 650 mg via ORAL
  Filled 2016-07-02: qty 2

## 2016-07-02 MED ORDER — ACETAMINOPHEN 500 MG PO TABS: 500.0000 mg | ORAL_TABLET | Freq: Four times a day (QID) | ORAL | 0 refills | Status: DC | PRN

## 2016-07-02 MED ORDER — CYCLOBENZAPRINE HCL 10 MG PO TABS
5.0000 mg | ORAL_TABLET | Freq: Once | ORAL | Status: AC
  Administered 2016-07-02: 5 mg via ORAL
  Filled 2016-07-02: qty 1
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
## 2016-07-02 NOTE — ED Notes (Signed)
Pt ambulatory and independent at discharge.  Verbalized understanding of discharge instructions 
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
## 2016-10-08 ENCOUNTER — Encounter: Payer: Self-pay | Admitting: Family Medicine

## 2016-11-17 ENCOUNTER — Emergency Department (HOSPITAL_BASED_OUTPATIENT_CLINIC_OR_DEPARTMENT_OTHER)
Admission: EM | Admit: 2016-11-17 | Discharge: 2016-11-17 | Disposition: A | Discharge status: Home / Self Care | Payer: 59 | Attending: Emergency Medicine | Admitting: Emergency Medicine

## 2016-11-17 ENCOUNTER — Encounter (HOSPITAL_BASED_OUTPATIENT_CLINIC_OR_DEPARTMENT_OTHER): Payer: Self-pay

## 2016-11-17 ENCOUNTER — Emergency Department (HOSPITAL_BASED_OUTPATIENT_CLINIC_OR_DEPARTMENT_OTHER): Payer: 59

## 2016-11-17 DIAGNOSIS — S0992XA Unspecified injury of nose, initial encounter: Secondary | ICD-10-CM | POA: Diagnosis present

## 2016-11-17 DIAGNOSIS — T1490XA Injury, unspecified, initial encounter: Secondary | ICD-10-CM

## 2016-11-17 DIAGNOSIS — Z79899 Other long term (current) drug therapy: Secondary | ICD-10-CM | POA: Diagnosis not present

## 2016-11-17 DIAGNOSIS — W541XXA Struck by dog, initial encounter: Secondary | ICD-10-CM | POA: Diagnosis not present

## 2016-11-17 DIAGNOSIS — F329 Major depressive disorder, single episode, unspecified: Secondary | ICD-10-CM | POA: Diagnosis not present

## 2016-11-17 DIAGNOSIS — Y929 Unspecified place or not applicable: Secondary | ICD-10-CM | POA: Diagnosis not present

## 2016-11-17 DIAGNOSIS — F17210 Nicotine dependence, cigarettes, uncomplicated: Secondary | ICD-10-CM | POA: Insufficient documentation

## 2016-11-17 DIAGNOSIS — Y998 Other external cause status: Secondary | ICD-10-CM | POA: Insufficient documentation

## 2016-11-17 DIAGNOSIS — Y9389 Activity, other specified: Secondary | ICD-10-CM | POA: Diagnosis not present

## 2016-11-17 DIAGNOSIS — S022XXA Fracture of nasal bones, initial encounter for closed fracture: Secondary | ICD-10-CM | POA: Diagnosis not present

## 2016-11-17 MED ORDER — IBUPROFEN 800 MG PO TABS: 800.0000 mg | ORAL_TABLET | Freq: Three times a day (TID) | ORAL | 0 refills | Status: DC

## 2016-11-17 NOTE — ED Triage Notes (Signed)
Pt's dog jump;ed at her-dog's head vs pt's nose-NAD-steady gait

## 2016-11-17 NOTE — ED Notes (Signed)
ED Provider at bedside. 

## 2016-11-19 NOTE — ED Provider Notes (Signed)
[facility] DEPART[facility] Provider Note   CSN: [id number] Arrival date & time: 11/17/16  2046     History   Chief Complaint Chief Complaint  Patient presents with  . Facial Injury    HPI Betty Leonard is a 36 y.o. female.  HPI Patient reports that her dog jumped up and hit her directly in her nose with the dog's head.  She reports she heard a pop and has a painful swollen area on her nose.  No other associated injury.  No loss of consciousness.  Reports the nose does have a new bump that was not there before. Past Medical History:  Diagnosis Date  . Alcohol abuse   . Chicken pox   . Depression   . Esophagitis 12/24/2011  . Frequent headaches   . GERD (gastroesophageal reflux disease)   . Hemorrhage of rectum and anus 12/16/2011  . Migraines   . Migraines   . Nausea with vomiting, chronic - followed by Dr. Deatra Ina and GI 12/16/2011  . Seasonal allergies   . UTI (urinary tract infection)     Patient Active Problem List   Diagnosis Date Noted  . Anxiety and depression 09/23/2012  . Hx of migraine headaches 09/23/2012  . Tobacco use disorder 09/23/2012  . Nausea with vomiting, chronic - followed by Dr. Deatra Ina and GI 12/16/2011    Past Surgical History:  Procedure Laterality Date  . ESOPHAGOGASTRODUODENOSCOPY  12/24/2011   Procedure: ESOPHAGOGASTRODUODENOSCOPY (EGD);  Surgeon: Inda Castle, MD;  Location: [facility];  Service: Endoscopy;  Laterality: N/A;  . FOOT SURGERY      OB History    No data available       Home Medications    Prior to Admission medications   Medication Sig Start Date End Date Taking? Authorizing Provider  Azithromycin (ZITHROMAX PO) Take by mouth.   Yes [provider]  PREDNISONE PO Take by mouth.   Yes [provider]  cetirizine (ZYRTEC) 10 MG tablet Take 1 tablet (10 mg total) by mouth daily. 06/07/14   Brewington, Tishira R, PA-C  EPINEPHrine 0.3 mg/0.3 mL IJ SOAJ injection Inject 0.3 mg  into the muscle once.    [provider]  ibuprofen (ADVIL,MOTRIN) 800 MG tablet Take 1 tablet (800 mg total) by mouth 3 (three) times daily. 11/17/16   Charlesetta Shanks, MD    Family History Family History  Problem Relation Age of Onset  . Leukemia Paternal Grandfather   . Lung cancer Paternal Grandfather   . Arthritis Paternal Grandfather   . Diabetes Paternal Grandfather   . Heart disease Paternal Grandfather   . Hypertension Paternal Grandfather   . Kidney disease Paternal Grandfather   . Prostate cancer Paternal Grandfather   . Gaucher's disease Paternal Grandfather   . Sudden death Father        age 85, nothing found on autopsy  . Diabetes Father   . Hyperlipidemia Father   . Hypertension Father   . Mental illness Mother        bipolar, schizophrenia  . Alcohol abuse Maternal Grandfather   . Stroke Paternal Grandmother   . Colon cancer Neg Hx     Social History Social History  Substance Use Topics  . Smoking status: Current Every Day Smoker    Packs/day: 0.50    Years: 5.00    Types: Cigarettes  . Smokeless tobacco: Never Used     Comment: per patient 4 to 5 cigarettes a day   . Alcohol  use 0.0 oz/week     Comment: occ     Allergies   Codeine; Shellfish allergy; and Aspirin   Review of Systems Review of Systems constitutional: No recent fever chills or general illness respiratory: No shortness of breath or difficulty breathing.  Physical Exam Updated Vital Signs BP 120/80 (BP Location: Left Arm)   Pulse 96   Temp 97.8 F (36.6 C) (Oral)   Resp 18   Ht 5\' 6"  (1.676 m)   Wt 105 kg (231 lb 7.7 oz)   LMP 11/02/2016   SpO2 100%   BMI 37.36 kg/m   Physical Exam  Constitutional: She is oriented to person, place, and time. She appears well-developed and well-nourished.  Patient is alert and nontoxic.  No acute distress.  HENT:  Nasal bridge has slight deformity.  There is a prominence in the midline without side to side deformity.  The septum  shows no hematoma and no bleeding.  From internal exam septum does not appear deviated.  Trace blood in the right nare with no clots present. no Dental injury.  Normal range of motion of the jaw.  Eyes: EOM are normal.  Pulmonary/Chest: Effort normal.  Neurological: She is alert and oriented to person, place, and time. No cranial nerve deficit. Coordination normal.  Skin: Skin is warm and dry.  Psychiatric: She has a normal mood and affect.     ED Treatments / Results  Labs (all labs ordered are listed, but only abnormal results are displayed) Labs Reviewed - No data to display  EKG  EKG Interpretation None       Radiology Dg Nasal Bones  Result Date: 11/17/2016 CLINICAL DATA:  Nasal injury. Dog head struck nose. Diffuse nasal pain. EXAM: NASAL BONES - 3+ VIEW COMPARISON:  None. FINDINGS: There is no evidence of fracture or other bone abnormality. Nasal septum is midline. IMPRESSION: Negative radiographs of the nasal bone. Electronically Signed   By: Jeb Levering M.D.   On: 11/17/2016 21:54    Procedures Procedures (including critical care time)  Medications Ordered in ED Medications - No data to display   Initial Impression / Assessment and Plan / ED Course  I have reviewed the triage vital signs and the nursing notes.  Pertinent labs & imaging results that were available during my care of the patient were reviewed by me and considered in my medical decision making (see chart for details).      Final Clinical Impressions(s) / ED Diagnoses   Final diagnoses:  Injury  Closed fracture of nasal bone, initial encounter  Likely findings are consistent with a nasal bone fracture that is not displaced to the side.  The septum appears straight on internal examination without any hematoma.  No active bleeding or clot present.  She is counseled on elevating the head and icing the bridge of the nose.  She is counseled to follow-up with ENT or plastic surgery to determine if  cosmetic repair will be needed.  New Prescriptions Discharge Medication List as of 11/17/2016 11:20 PM    START taking these medications   Details  ibuprofen (ADVIL,MOTRIN) 800 MG tablet Take 1 tablet (800 mg total) by mouth 3 (three) times daily., Starting Tue 11/17/2016, Print         Charlesetta Shanks, MD 11/19/16 [phone]

## 2017-01-28 ENCOUNTER — Encounter: Payer: Self-pay | Admitting: Family Medicine

## 2017-04-13 ENCOUNTER — Emergency Department (HOSPITAL_BASED_OUTPATIENT_CLINIC_OR_DEPARTMENT_OTHER)
Admission: EM | Admit: 2017-04-13 | Discharge: 2017-04-14 | Disposition: A | Discharge status: Home / Self Care | Payer: 59 | Attending: Emergency Medicine | Admitting: Emergency Medicine

## 2017-04-13 ENCOUNTER — Other Ambulatory Visit: Payer: Self-pay

## 2017-04-13 ENCOUNTER — Encounter (HOSPITAL_BASED_OUTPATIENT_CLINIC_OR_DEPARTMENT_OTHER): Payer: Self-pay | Admitting: Emergency Medicine

## 2017-04-13 ENCOUNTER — Emergency Department (HOSPITAL_BASED_OUTPATIENT_CLINIC_OR_DEPARTMENT_OTHER): Payer: 59

## 2017-04-13 DIAGNOSIS — N83202 Unspecified ovarian cyst, left side: Secondary | ICD-10-CM | POA: Diagnosis not present

## 2017-04-13 DIAGNOSIS — R109 Unspecified abdominal pain: Secondary | ICD-10-CM

## 2017-04-13 DIAGNOSIS — F17210 Nicotine dependence, cigarettes, uncomplicated: Secondary | ICD-10-CM | POA: Insufficient documentation

## 2017-04-13 DIAGNOSIS — Z79899 Other long term (current) drug therapy: Secondary | ICD-10-CM | POA: Diagnosis not present

## 2017-04-13 DIAGNOSIS — R1032 Left lower quadrant pain: Secondary | ICD-10-CM | POA: Diagnosis present

## 2017-04-13 LAB — URINALYSIS, ROUTINE W REFLEX MICROSCOPIC
BILIRUBIN URINE: NEGATIVE
Glucose, UA: NEGATIVE mg/dL
HGB URINE DIPSTICK: NEGATIVE
KETONES UR: NEGATIVE mg/dL
Leukocytes, UA: NEGATIVE
Nitrite: NEGATIVE
PH: 7 (ref 5.0–8.0)
Protein, ur: NEGATIVE mg/dL
Specific Gravity, Urine: 1.015 (ref 1.005–1.030)

## 2017-04-13 LAB — PREGNANCY, URINE: Preg Test, Ur: NEGATIVE

## 2017-04-13 NOTE — ED Triage Notes (Signed)
Patient states that she went to her PMD yesterday for left side flank pain that radiates to her groin. The patient reports that since she had a "kidney infection" a month ago she has had intermittent pain to her left flank down into her groin. So N/V today. Had 2 u/s to day to check for Kideny stone and Ovarian Cyst but the pain is worse and it could take a week to get those results. The patient reports that she can not wait

## 2017-04-13 NOTE — ED Provider Notes (Signed)
[facility] EMERGENCY DEPARTMENT Provider Note   CSN: [id number] Arrival date & time: 04/13/17  2014     History   Chief Complaint Chief Complaint  Patient presents with  . Flank Pain    HPI Betty Leonard is a 37 y.o. female with PMH/o GERD, migraines, depression who presents for evaluation of left flank pain that has been ongoing for the last 2 days.  Patient reports that she has had this pain intermittently for the last month.  Patient reports that she had the pain evaluated a month ago and had a kidney infection.  Patient states that she was given antibiotics and pain medication which helped improve her symptoms.  She states that she would intermittently have similar pain but states it would usually resolve.  Patient reports that 2 days ago, she started experiencing constant left flank pain that radiates into the right groin.  No preceding trauma, injury, fall.  She has been able to ambulate without any difficulty.  Patient reports that she will intermittently take ibuprofen but does not take it regularly scheduled for the pain.  Patient states that she went back to her doctor today and had a renal ultrasound and ovarian ultrasound done but states that she does not know the results of them.  Patient states that she came to the emergency department because the pain was becoming more severe.  She reports she has had some nausea but no vomiting.  Patient denies any fevers, abdominal pain, chest pain, difficulty breathing, dysuria, hematuria, vaginal bleeding, vaginal discharge.  The history is provided by the patient.    Past Medical History:  Diagnosis Date  . Alcohol abuse   . Chicken pox   . Depression   . Esophagitis 12/24/2011  . Frequent headaches   . GERD (gastroesophageal reflux disease)   . Hemorrhage of rectum and anus 12/16/2011  . Migraines   . Migraines   . Nausea with vomiting, chronic - followed by Dr. Deatra Ina and GI 12/16/2011  . Seasonal allergies   . UTI  (urinary tract infection)     Patient Active Problem List   Diagnosis Date Noted  . Anxiety and depression 09/23/2012  . Hx of migraine headaches 09/23/2012  . Tobacco use disorder 09/23/2012  . Nausea with vomiting, chronic - followed by Dr. Deatra Ina and GI 12/16/2011    Past Surgical History:  Procedure Laterality Date  . ESOPHAGOGASTRODUODENOSCOPY  12/24/2011   Procedure: ESOPHAGOGASTRODUODENOSCOPY (EGD);  Surgeon: Inda Castle, MD;  Location: [facility];  Service: Endoscopy;  Laterality: N/A;  . FOOT SURGERY       OB History   None      Home Medications    Prior to Admission medications   Medication Sig Start Date End Date Taking? Authorizing Provider  propranolol (INDERAL) 10 MG tablet Take 10 mg by mouth 3 (three) times daily.   Yes [provider]  Azithromycin (ZITHROMAX PO) Take by mouth.    [provider]  cetirizine (ZYRTEC) 10 MG tablet Take 1 tablet (10 mg total) by mouth daily. 06/07/14   Brewington, Tishira R, PA-C  EPINEPHrine 0.3 mg/0.3 mL IJ SOAJ injection Inject 0.3 mg into the muscle once.    [provider]  ibuprofen (ADVIL,MOTRIN) 800 MG tablet Take 1 tablet (800 mg total) by mouth 3 (three) times daily. 04/14/17   Palumbo, April, MD  PREDNISONE PO Take by mouth.    [provider]    Family History Family History  Problem Relation Age  of Onset  . Leukemia Paternal Grandfather   . Lung cancer Paternal Grandfather   . Arthritis Paternal Grandfather   . Diabetes Paternal Grandfather   . Heart disease Paternal Grandfather   . Hypertension Paternal Grandfather   . Kidney disease Paternal Grandfather   . Prostate cancer Paternal Grandfather   . Gaucher's disease Paternal Grandfather   . Sudden death Father        age 25, nothing found on autopsy  . Diabetes Father   . Hyperlipidemia Father   . Hypertension Father   . Mental illness Mother        bipolar, schizophrenia  . Alcohol abuse Maternal Grandfather     . Stroke Paternal Grandmother   . Colon cancer Neg Hx     Social History Social History   Tobacco Use  . Smoking status: Current Every Day Smoker    Packs/day: 0.50    Years: 5.00    Pack years: 2.50    Types: Cigarettes  . Smokeless tobacco: Never Used  . Tobacco comment: per patient 4 to 5 cigarettes a day   Substance Use Topics  . Alcohol use: Yes    Alcohol/week: 0.0 oz    Comment: occ  . Drug use: No     Allergies   Codeine; Shellfish allergy; and Aspirin   Review of Systems Review of Systems  Constitutional: Negative for chills and fever.  HENT: Negative for congestion.   Eyes: Negative for visual disturbance.  Respiratory: Negative for cough and shortness of breath.   Cardiovascular: Negative for chest pain.  Gastrointestinal: Negative for abdominal pain, diarrhea, nausea and vomiting.  Genitourinary: Positive for flank pain. Negative for dysuria and hematuria.  Musculoskeletal: Negative for back pain and neck pain.  Skin: Negative for rash.  Neurological: Negative for dizziness, weakness, numbness and headaches.  Psychiatric/Behavioral: Negative for confusion.     Physical Exam Updated Vital Signs BP 110/65 (BP Location: Right Arm)   Pulse 77   Temp 98.1 F (36.7 C) (Oral)   Resp 18   Ht 5\' 6"  (1.676 m)   Wt 101.2 kg (223 lb)   LMP 03/30/2017   SpO2 98%   BMI 35.99 kg/m   Physical Exam  Constitutional: She is oriented to person, place, and time. She appears well-developed and well-nourished.  HENT:  Head: Normocephalic and atraumatic.  Mouth/Throat: Oropharynx is clear and moist and mucous membranes are normal.  Eyes: Pupils are equal, round, and reactive to light. Conjunctivae, EOM and lids are normal.  Neck: Full passive range of motion without pain.  Cardiovascular: Normal rate, regular rhythm, normal heart sounds and normal pulses. Exam reveals no gallop and no friction rub.  No murmur heard. Pulmonary/Chest: Effort normal and breath  sounds normal.  Abdominal: Soft. Normal appearance. There is no tenderness. There is no rigidity and no guarding.  Genitourinary: Uterus normal. Cervix exhibits no motion tenderness and no friability. Right adnexum displays no mass and no tenderness. Left adnexum displays tenderness. Left adnexum displays no mass. Vaginal discharge found.  Genitourinary Comments: The exam was performed with a chaperone present. Normal external female genitalia. No lesions, rash, or sores. Small amount of white discharge.  No CMT, cervical friability.  No right adnexal mass or tenderness.  Mild left adnexal tenderness.  No mass palpated.  Musculoskeletal: Normal range of motion.  No tenderness to palpation to bilateral knees and ankles. No deformities or crepitus noted. FROM of BLE without any difficulty. FROM of LLE without any dificulty.  No  pain with internal or external rotation.  Neurological: She is alert and oriented to person, place, and time.  Skin: Skin is warm and dry. Capillary refill takes less than 2 seconds.  Psychiatric: She has a normal mood and affect. Her speech is normal.  Nursing note and vitals reviewed.    ED Treatments / Results  Labs (all labs ordered are listed, but only abnormal results are displayed) Labs Reviewed  WET PREP, GENITAL - Abnormal; Notable for the following components:      Result Value   WBC, Wet Prep HPF POC MODERATE (*)    All other components within normal limits  URINALYSIS, ROUTINE W REFLEX MICROSCOPIC  PREGNANCY, URINE  GC/CHLAMYDIA PROBE AMP ([place]) NOT AT [facility]    EKG None  Radiology US Transvaginal Non-ob  Result Date: 04/14/2017 CLINICAL DATA:  37 year old female with left back pain. EXAM: TRANSABDOMINAL AND TRANSVAGINAL ULTRASOUND OF PELVIS DOPPLER ULTRASOUND OF OVARIES TECHNIQUE: Both transabdominal and transvaginal ultrasound examinations of the pelvis were performed. Transabdominal technique was performed for global imaging of the pelvis  including uterus, ovaries, adnexal regions, and pelvic cul-de-sac. It was necessary to proceed with endovaginal exam following the transabdominal exam to visualize the endometrium and ovaries. Color and duplex Doppler ultrasound was utilized to evaluate blood flow to the ovaries. COMPARISON:  None. FINDINGS: Uterus Measurements: 8.0 x 4.1 x 4.6 cm. The uterus is retroverted. There is suggestion of a septate or sub septate morphology. Endometrium Thickness: 12 mm. The endometrium is heterogeneous which may be related to late proliferative or secretory menstrual cycle. The endometrium can be best evaluated during day 5-10 of the menstrual cycle. If there is vaginal bleeding or concerning for an endometrial lesion further evaluation with saline infused sonohysterography recommended. Right ovary Not visualized. Left ovary Measurements: 3.6 x 2.6 x 2.4 cm. There is a 2.3 x 1.8 x 2.0 cm complex hypoechoic structure with no internal vascularity in the left ovary, possibly a hemorrhagic cyst. Clinical correlation and follow-up after 2 menstrual cycles recommended. Pulsed Doppler evaluation of the left ovary demonstrates normal low-resistance arterial and venous waveforms. Other findings Small free fluid within the pelvis. IMPRESSION: 1. Complex, probable hemorrhagic cyst in the left ovary. Doppler detected flow in the left ovary. 2. Nonvisualization of the right ovary. 3. Mildly lobulated and heterogeneous endometrium, likely related to late proliferative or secretory phase of the menstrual cycle. Clinical correlation is recommended. Electronically Signed   By: Anner Crete M.D.   On: 04/14/2017 01:29   US Pelvis Complete  Result Date: 04/14/2017 CLINICAL DATA:  37 year old female with left back pain. EXAM: TRANSABDOMINAL AND TRANSVAGINAL ULTRASOUND OF PELVIS DOPPLER ULTRASOUND OF OVARIES TECHNIQUE: Both transabdominal and transvaginal ultrasound examinations of the pelvis were performed. Transabdominal technique  was performed for global imaging of the pelvis including uterus, ovaries, adnexal regions, and pelvic cul-de-sac. It was necessary to proceed with endovaginal exam following the transabdominal exam to visualize the endometrium and ovaries. Color and duplex Doppler ultrasound was utilized to evaluate blood flow to the ovaries. COMPARISON:  None. FINDINGS: Uterus Measurements: 8.0 x 4.1 x 4.6 cm. The uterus is retroverted. There is suggestion of a septate or sub septate morphology. Endometrium Thickness: 12 mm. The endometrium is heterogeneous which may be related to late proliferative or secretory menstrual cycle. The endometrium can be best evaluated during day 5-10 of the menstrual cycle. If there is vaginal bleeding or concerning for an endometrial lesion further evaluation with saline infused sonohysterography recommended. Right ovary Not visualized. Left  ovary Measurements: 3.6 x 2.6 x 2.4 cm. There is a 2.3 x 1.8 x 2.0 cm complex hypoechoic structure with no internal vascularity in the left ovary, possibly a hemorrhagic cyst. Clinical correlation and follow-up after 2 menstrual cycles recommended. Pulsed Doppler evaluation of the left ovary demonstrates normal low-resistance arterial and venous waveforms. Other findings Small free fluid within the pelvis. IMPRESSION: 1. Complex, probable hemorrhagic cyst in the left ovary. Doppler detected flow in the left ovary. 2. Nonvisualization of the right ovary. 3. Mildly lobulated and heterogeneous endometrium, likely related to late proliferative or secretory phase of the menstrual cycle. Clinical correlation is recommended. Electronically Signed   By: Anner Crete M.D.   On: 04/14/2017 01:29   [place] Art/ven Flow Abd Pelv Doppler  Result Date: 04/14/2017 CLINICAL DATA:  37 year old female with left back pain. EXAM: TRANSABDOMINAL AND TRANSVAGINAL ULTRASOUND OF PELVIS DOPPLER ULTRASOUND OF OVARIES TECHNIQUE: Both transabdominal and transvaginal ultrasound  examinations of the pelvis were performed. Transabdominal technique was performed for global imaging of the pelvis including uterus, ovaries, adnexal regions, and pelvic cul-de-sac. It was necessary to proceed with endovaginal exam following the transabdominal exam to visualize the endometrium and ovaries. Color and duplex Doppler ultrasound was utilized to evaluate blood flow to the ovaries. COMPARISON:  None. FINDINGS: Uterus Measurements: 8.0 x 4.1 x 4.6 cm. The uterus is retroverted. There is suggestion of a septate or sub septate morphology. Endometrium Thickness: 12 mm. The endometrium is heterogeneous which may be related to late proliferative or secretory menstrual cycle. The endometrium can be best evaluated during day 5-10 of the menstrual cycle. If there is vaginal bleeding or concerning for an endometrial lesion further evaluation with saline infused sonohysterography recommended. Right ovary Not visualized. Left ovary Measurements: 3.6 x 2.6 x 2.4 cm. There is a 2.3 x 1.8 x 2.0 cm complex hypoechoic structure with no internal vascularity in the left ovary, possibly a hemorrhagic cyst. Clinical correlation and follow-up after 2 menstrual cycles recommended. Pulsed Doppler evaluation of the left ovary demonstrates normal low-resistance arterial and venous waveforms. Other findings Small free fluid within the pelvis. IMPRESSION: 1. Complex, probable hemorrhagic cyst in the left ovary. Doppler detected flow in the left ovary. 2. Nonvisualization of the right ovary. 3. Mildly lobulated and heterogeneous endometrium, likely related to late proliferative or secretory phase of the menstrual cycle. Clinical correlation is recommended. Electronically Signed   By: Anner Crete M.D.   On: 04/14/2017 01:29    Procedures Procedures (including critical care time)  Medications Ordered in ED Medications - No data to display   Initial Impression / Assessment and Plan / ED Course  I have reviewed the triage  vital signs and the nursing notes.  Pertinent labs & imaging results that were available during my care of the patient were reviewed by me and considered in my medical decision making (see chart for details).     37 y.o. female who presents for evaluation of left flank pain times 3 days.  Patient reports history of intermittent left flank pain over the last few months.  Was treated for pyelonephritis and states she has had intermittent pain since then.  No fevers, nausea/vomiting, hematuria, dysuria.  Seen by PCP today and had a renal ultrasound and ultrasound of ovary but not the results.  Here, prompting ED visit. Patient is afebrile, non-toxic appearing, sitting comfortably on examination table. Vital signs reviewed and stable.  Abdomen exam is benign.  No CVA tenderness bilaterally.  Normal range of motion of  bilateral lower extremities.  Do not suspect this is musculoskeletal pain.  Consider UTI versus is not concerning for appendicitis, diverticulitis.  Plan kidney stone vs ovarian etiology.  History/physical exam to evaluate urine, pelvic exam.  Urine reviewed.  Negative for any hemoglobin, acute infectious etiology.  Urine pregnancy negative.  Pelvic exam as documented above.  Exam not concerning for PID.  Patient did have left adnexal tenderness.  Will plan to get an ultrasound for further evaluation.  Wet prep negative for any clue cells that would indicate BV.  Patient signed out to Dr. Randal Buba with U/S evaluation pending.  Anticipate discharge if ultrasounds are unremarkable.  Patient instructed to follow-up with primary care doctor in the next 24-40 hours for further evaluation. Patient had ample opportunity for questions and discussion. All patient's questions were answered with full understanding. Strict return precautions discussed. Patient expresses understanding and agreement to plan.    Final Clinical Impressions(s) / ED Diagnoses   Final diagnoses:  Flank pain  Hemorrhagic  cyst of left ovary    ED Discharge Orders        Ordered    ibuprofen (ADVIL,MOTRIN) 800 MG tablet  3 times daily     04/14/17 0150       [name], PA-C 04/15/17 0158    Palumbo, April, MD 04/15/17 3143

## 2017-04-14 LAB — WET PREP, GENITAL
Clue Cells Wet Prep HPF POC: NONE SEEN
SPERM: NONE SEEN
Trich, Wet Prep: NONE SEEN
Yeast Wet Prep HPF POC: NONE SEEN

## 2017-04-14 MED ORDER — IBUPROFEN 800 MG PO TABS: 800.0000 mg | ORAL_TABLET | Freq: Three times a day (TID) | ORAL | 0 refills | Status: DC

## 2017-04-14 MED ORDER — FENTANYL CITRATE (PF) 100 MCG/2ML IJ SOLN
50.0000 ug | Freq: Once | INTRAMUSCULAR | Status: DC
  Filled 2017-04-14: qty 2

## 2017-04-14 NOTE — Discharge Instructions (Addendum)
You can take Tylenol or Ibuprofen as directed for pain. You can alternate Tylenol and Ibuprofen every 4 hours. If you take Tylenol at 1pm, then you can take Ibuprofen at 5pm. Then you can take Tylenol again at 9pm.   Follow-up with your primary care doctor in the next 24-48 hours. Follow-up with your primary care doctor regarding the results of the ultrasounds that were done this morning.   Return to the Emergency Department immediately if you experience any worsening abdominal pain, fever, persistent nausea and vomiting, inability keep any food down, pain with urination, blood in your urine or any other worsening or concerning symptoms.

## 2017-04-15 LAB — GC/CHLAMYDIA PROBE AMP (~~LOC~~) NOT AT ARMC
Chlamydia: NEGATIVE
NEISSERIA GONORRHEA: NEGATIVE

## 2017-04-16 ENCOUNTER — Encounter: Payer: Self-pay | Admitting: Obstetrics & Gynecology

## 2017-04-16 ENCOUNTER — Ambulatory Visit (INDEPENDENT_AMBULATORY_CARE_PROVIDER_SITE_OTHER): Payer: 59 | Admitting: Obstetrics & Gynecology

## 2017-04-16 VITALS — BP 122/60 | HR 96 | Resp 16 | Ht 66.75 in | Wt 225.0 lb

## 2017-04-16 DIAGNOSIS — N83202 Unspecified ovarian cyst, left side: Secondary | ICD-10-CM

## 2017-04-16 DIAGNOSIS — R1032 Left lower quadrant pain: Secondary | ICD-10-CM | POA: Diagnosis not present

## 2017-04-16 NOTE — Progress Notes (Signed)
GYNECOLOGY  VISIT  CC:   LLQ pain/ER follow-up  HPI: 37 y.o. G0P0000 Single Caucasian female here as new patient for ovarian cyst.  Reports she's had some pelvic pain for the past month but this past Sunday, pain began to localize in the LLQ and was so severe that she went to the ER.   of this past week the pain moved to her LLQ.  She's never had a kidney stone.  Ultrasound was performed showing a 2.3 x 1.8 x 2.0cm complex hypoechoic finding d/w hemorrhagic ovarian cyst.    Pt's hx was reviewed today prior to me coming into the room with CMA, Lowell Bouton.  Pt was very upset with a part of her hx that was entered by pt's PCP's office.    Declines any exam today.  Willing to discuss results of imaging and options for treatment with me.    Does mention she would like surgery which is not indicated at this point.  Repeat ultrasound would be and surgery if lesion does not resolve and pain persists.    Pt also states she realized upon coming into the building where her previous gynecologist has not moved--Dr. Dellis Filbert.  Pt is aware I am happy to send records to Dr. Dellis Filbert if she desires after today to proceed with care with her.  GYNECOLOGIC HISTORY: Patient's last menstrual period was 03/30/2017. Contraception: abstinence Menopausal hormone therapy: none  Patient Active Problem List   Diagnosis Date Noted  . Anxiety and depression 09/23/2012  . Hx of migraine headaches 09/23/2012  . Tobacco use disorder 09/23/2012  . Nausea with vomiting, chronic - followed by Dr. Deatra Ina and GI 12/16/2011    Past Medical History:  Diagnosis Date  . Alcohol abuse   . Chicken pox   . Depression   . Esophagitis 12/24/2011  . Frequent headaches   . GERD (gastroesophageal reflux disease)   . Hemorrhage of rectum and anus 12/16/2011  . Migraines   . Migraines   . Nausea with vomiting, chronic - followed by Dr. Deatra Ina and GI 12/16/2011  . Seasonal allergies   . UTI (urinary tract infection)     Past  Surgical History:  Procedure Laterality Date  . ESOPHAGOGASTRODUODENOSCOPY  12/24/2011   Procedure: ESOPHAGOGASTRODUODENOSCOPY (EGD);  Surgeon: Inda Castle, MD;  Location: [facility];  Service: Endoscopy;  Laterality: N/A;  . FOOT SURGERY      MEDS:   Current Outpatient Medications on File Prior to Visit  Medication Sig Dispense Refill  . baclofen (LIORESAL) 10 MG tablet Take 2 tablets by mouth daily as needed.    . cetirizine (ZYRTEC) 10 MG tablet Take 1 tablet (10 mg total) by mouth daily. 30 tablet 11  . EPINEPHrine 0.3 mg/0.3 mL IJ SOAJ injection Inject 0.3 mg into the muscle once.    Marland Kitchen ibuprofen (ADVIL,MOTRIN) 800 MG tablet Take 1 tablet by mouth 3 (three) times daily as needed.    . propranolol (INDERAL) 10 MG tablet Take 10 mg by mouth 3 (three) times daily.    Marland Kitchen zolpidem (AMBIEN) 10 MG tablet Take 1 tablet by mouth at bedtime.     No current facility-administered medications on file prior to visit.     ALLERGIES: Codeine; Shellfish allergy; and Aspirin  Family History  Problem Relation Age of Onset  . Leukemia Paternal Grandfather   . Lung cancer Paternal Grandfather   . Arthritis Paternal Grandfather   . Diabetes Paternal Grandfather   . Heart disease Paternal Grandfather   .  Hypertension Paternal Grandfather   . Kidney disease Paternal Grandfather   . Prostate cancer Paternal Grandfather   . Gaucher's disease Paternal Grandfather   . Sudden death Father        age 69, nothing found on autopsy  . Diabetes Father   . Hyperlipidemia Father   . Hypertension Father   . Mental illness Mother        bipolar, schizophrenia  . Alcohol abuse Maternal Grandfather   . Stroke Paternal Grandmother   . Colon cancer Neg Hx     SH:  Single, smoker  Review of Systems  Gastrointestinal: Positive for abdominal pain.  All other systems reviewed and are negative.   PHYSICAL EXAMINATION:    BP 122/60 (BP Location: Right Arm, Patient Position: Sitting, Cuff Size: Large)    Pulse 96   Resp 16   Ht 5' 6.75" (1.695 m)   Wt 225 lb (102.1 kg)   LMP 03/30/2017   BMI 35.50 kg/m     General appearance: alert, cooperative and appears stated age No exam performed today.  Assessment: LLQ pain ~3cm left complex ovarian cyst c/w hemorrhagic cyst  Plan: Repeat PUS 4-8 weeks.  Does not need pain medication.

## 2017-04-19 ENCOUNTER — Telehealth: Payer: Self-pay | Admitting: Obstetrics & Gynecology

## 2017-04-19 NOTE — Telephone Encounter (Signed)
Left patient a message to call back and ask for Starla. See staff message from [place].

## 2017-04-26 ENCOUNTER — Telehealth: Payer: Self-pay | Admitting: Obstetrics & Gynecology

## 2017-04-26 ENCOUNTER — Encounter: Payer: Self-pay | Admitting: Obstetrics & Gynecology

## 2017-04-26 NOTE — Telephone Encounter (Signed)
Spoke with patient. Patient is calling with menses to schedule PUS. Advised reviewed with Dr.Miller and recheck in 6 weeks would be ideal, but as patient desires earlier recheck this can be done after menses. PUS scheduled for 4/18 at 2 pm with 2:30 pm consult with Dr.Miller. Patient is agreeable to date and time. Reviewed PMH with patient and that this has been updated after Dr.Miller reviewed this with Dr.Kim.  Routing to provider for final review. Patient agreeable to disposition. Will close encounter.

## 2017-04-26 NOTE — Telephone Encounter (Signed)
Patient called to report she started her menstrual cycle. She said she was to call our office when this happened.

## 2017-05-05 ENCOUNTER — Other Ambulatory Visit: Payer: Self-pay

## 2017-05-05 ENCOUNTER — Telehealth: Payer: Self-pay | Admitting: Obstetrics & Gynecology

## 2017-05-05 DIAGNOSIS — N83202 Unspecified ovarian cyst, left side: Secondary | ICD-10-CM

## 2017-05-05 NOTE — Telephone Encounter (Signed)
Returned call to Betty Leonard. Spoke with Betty Leonard regarding benefits for scheudled ultrasound appointment. Betty Leonard understood and agreeable. Betty Leonard scheduled 05/06/17 with Dr Sabra Heck. Betty Leonard aware of appointment date, arrival time and cancellation policy.  No further questions. Ok to close

## 2017-05-05 NOTE — Telephone Encounter (Signed)
Patient returning Suzy's call. °

## 2017-05-05 NOTE — Telephone Encounter (Signed)
Call placed to patient to convey benefit information for scheduled ultrasound appointment. Left voicemail message requesting a return call

## 2017-05-06 ENCOUNTER — Encounter: Payer: Self-pay | Admitting: Obstetrics & Gynecology

## 2017-05-06 ENCOUNTER — Ambulatory Visit (INDEPENDENT_AMBULATORY_CARE_PROVIDER_SITE_OTHER): Payer: 59 | Admitting: Obstetrics & Gynecology

## 2017-05-06 ENCOUNTER — Ambulatory Visit (INDEPENDENT_AMBULATORY_CARE_PROVIDER_SITE_OTHER): Payer: 59

## 2017-05-06 ENCOUNTER — Other Ambulatory Visit (HOSPITAL_COMMUNITY)
Admission: RE | Admit: 2017-05-06 | Discharge: 2017-05-06 | Disposition: A | Discharge status: Home / Self Care | Payer: 59 | Source: Ambulatory Visit | Attending: Obstetrics & Gynecology | Admitting: Obstetrics & Gynecology

## 2017-05-06 VITALS — BP 104/70 | HR 88 | Resp 14 | Ht 66.75 in | Wt 225.0 lb

## 2017-05-06 DIAGNOSIS — N83202 Unspecified ovarian cyst, left side: Secondary | ICD-10-CM

## 2017-05-06 DIAGNOSIS — B977 Papillomavirus as the cause of diseases classified elsewhere: Secondary | ICD-10-CM

## 2017-05-06 DIAGNOSIS — R1032 Left lower quadrant pain: Secondary | ICD-10-CM | POA: Diagnosis not present

## 2017-05-06 MED ORDER — IBUPROFEN 800 MG PO TABS: 800.0000 mg | ORAL_TABLET | Freq: Three times a day (TID) | ORAL | 1 refills | Status: DC | PRN

## 2017-05-06 NOTE — Progress Notes (Signed)
GYNECOLOGY  VISIT  CC:   Follow up for LLQ pain and hemorrhagic cyst  HPI: 37 y.o. G0P0000 Single Caucasian female here for pelvic ultrasound due to h/o hemorrhagia ovarian cyst.  Pt was initially seen in the ER due to the pain on 04/13/17.  Ultrasound showed a hemorrhagic left ovarian cyst.  Pt reports pain has improved over the past few weeks.  It is intermittent more now and much more manageable.  Has taken some Motrin but not regularly any more.  Would like a RF for this.    Reports her cycle was heavier this past month than normal.  Flow lasted five days but the first two were particularly heavy.  This tapered off quickly.  LMP was 04/26/17.    Does report some increased frequency of urination but this is improved compared to when she went to the ER.  Also, denies any changes in bowel habits.  Does need repeat pap smear.  Is ok to do this today.  GYNECOLOGIC HISTORY: Patient's last menstrual period was 04/26/2017. Contraception: abstinence   Patient Active Problem List   Diagnosis Date Noted  . Anxiety and depression 09/23/2012  . Hx of migraine headaches 09/23/2012  . Tobacco use disorder 09/23/2012  . Nausea with vomiting, chronic - followed by Dr. Deatra Ina and GI 12/16/2011    Past Medical History:  Diagnosis Date  . Chicken pox   . Depression   . Esophagitis 12/24/2011  . Frequent headaches   . GERD (gastroesophageal reflux disease)   . Hemorrhage of rectum and anus 12/16/2011  . Migraines   . Migraines   . Nausea with vomiting, chronic - followed by Dr. Deatra Ina and GI 12/16/2011  . Seasonal allergies   . UTI (urinary tract infection)     Past Surgical History:  Procedure Laterality Date  . ESOPHAGOGASTRODUODENOSCOPY  12/24/2011   Procedure: ESOPHAGOGASTRODUODENOSCOPY (EGD);  Surgeon: Inda Castle, MD;  Location: [facility];  Service: Endoscopy;  Laterality: N/A;  . FOOT SURGERY      MEDS:   Current Outpatient Medications on File Prior to Visit  Medication Sig  Dispense Refill  . baclofen (LIORESAL) 10 MG tablet Take 2 tablets by mouth daily as needed.    . cetirizine (ZYRTEC) 10 MG tablet Take 1 tablet (10 mg total) by mouth daily. 30 tablet 11  . EPINEPHrine 0.3 mg/0.3 mL IJ SOAJ injection Inject 0.3 mg into the muscle once.    . propranolol (INDERAL) 10 MG tablet Take 10 mg by mouth 3 (three) times daily.    Marland Kitchen zolpidem (AMBIEN) 10 MG tablet Take 1 tablet by mouth at bedtime.     No current facility-administered medications on file prior to visit.     ALLERGIES: Codeine; Shellfish allergy; and Aspirin  Family History  Problem Relation Age of Onset  . Leukemia Paternal Grandfather   . Lung cancer Paternal Grandfather   . Arthritis Paternal Grandfather   . Diabetes Paternal Grandfather   . Heart disease Paternal Grandfather   . Hypertension Paternal Grandfather   . Kidney disease Paternal Grandfather   . Prostate cancer Paternal Grandfather   . Gaucher's disease Paternal Grandfather   . Sudden death Father        age 52, nothing found on autopsy  . Diabetes Father   . Hyperlipidemia Father   . Hypertension Father   . Mental illness Mother        bipolar, schizophrenia  . Alcohol abuse Maternal Grandfather   . Stroke Paternal Grandmother   .  Colon cancer Neg Hx     SH:  Single, smoker  Review of Systems  Constitutional: Negative.   Respiratory: Negative.   Cardiovascular: Negative.   Gastrointestinal: Negative.   Genitourinary:       Intermittent LLQ pain    PHYSICAL EXAMINATION:    BP 104/70 (BP Location: Right Arm, Patient Position: Sitting, Cuff Size: Large)   Pulse 88   Resp 14   Ht 5' 6.75" (1.695 m)   Wt 225 lb (102.1 kg)   LMP 04/26/2017   BMI 35.50 kg/m     General appearance: alert, cooperative and appears stated age Abdomen: soft, non-tender; bowel sounds normal; no masses,  no organomegaly Inguinal:  No LAD noted  Pelvic: External genitalia:  no lesions              Urethra:  normal appearing urethra  with no masses, tenderness or lesions              Bartholins and Skenes: normal                 Vagina: normal appearing vagina with normal color and discharge, no lesions              Cervix: no lesions              Bimanual Exam:  Uterus:  normal size, contour, position, consistency, mobility, non-tender              Adnexa: no mass, fullness, tenderness  Ultrasound was performed today as well. Uterus:  7.1 x 4.6 x 4.0cm.  No fibroids noted. Endometrium:  4.52mm Left ovary:  3.5 x 2.2 x 2.0cm Right ovary:  3.2 x 2.2 x 1.6cm.  Ovaries are normal with normal follicular pattern. Cul de sac:  Negative for fluid  Assessment: LLQ pain that is improved H/o hemorrhagic ovarian cyst that has resolved today on ultrasound H/O +HR HPV, pap due Smoking hx  Plan: She will continue to monitor pain and let me know if not resolved in another month Pap and HR HPV obtained today Rx for Motrin 800mg  every 8 hr prn.  #60/1RF.  ~25 minutes spent with patient >50% of time was in face to face discussion of above.

## 2017-05-12 LAB — CYTOLOGY - PAP: HPV: DETECTED — AB

## 2017-05-13 ENCOUNTER — Other Ambulatory Visit: Payer: Self-pay | Admitting: *Deleted

## 2017-05-13 DIAGNOSIS — R87810 Cervical high risk human papillomavirus (HPV) DNA test positive: Secondary | ICD-10-CM

## 2017-05-13 DIAGNOSIS — R87612 Low grade squamous intraepithelial lesion on cytologic smear of cervix (LGSIL): Secondary | ICD-10-CM

## 2017-05-14 ENCOUNTER — Telehealth: Payer: Self-pay | Admitting: Obstetrics and Gynecology

## 2017-05-14 NOTE — Telephone Encounter (Signed)
Call placed to convey benefits. 

## 2017-05-31 ENCOUNTER — Telehealth: Payer: Self-pay | Admitting: Obstetrics & Gynecology

## 2017-05-31 NOTE — Telephone Encounter (Signed)
Patient calling to schedule a colposcopy procedure. °

## 2017-05-31 NOTE — Telephone Encounter (Signed)
Call returned to patient, colpo scheduled for 5/17 at 12:45pm with Dr. Sabra Heck. Patient advised to arrive 15 min early, Motrin 800 mg with food and water one hour before procedure.  Order for colpo previously placed.   Routing to provider for final review. Patient is agreeable to disposition. Will close encounter.  Cc: Lerry Liner, Magdalene Patricia

## 2017-05-31 NOTE — Telephone Encounter (Addendum)
Spoke with patient, request to schedule colpo. LMP 05/28/17, no contraceptive. Currently in [place], will return on 5/15, requesting to schedule on 5/16 or 17.  Advised will review schedule with nursing supervisor and return call, patient agreeable.

## 2017-06-02 ENCOUNTER — Telehealth: Payer: Self-pay | Admitting: Obstetrics & Gynecology

## 2017-06-02 NOTE — Telephone Encounter (Signed)
Patient called stating that she has a colpo scheduled for Friday and is supposed to run a 5K on Saturday and asked if that was going to be an issue.

## 2017-06-02 NOTE — Telephone Encounter (Signed)
The patient will likely have bleeding and discharge after the colposcopy.  This can vary from patient to patient and partially depends on how much or little needs to be done for her visit. Running afterward may increase her bleeding.  Does she want to consider rescheduling with Dr. Sabra Heck for after the 5K?  Cc- Dr. Sabra Heck

## 2017-06-02 NOTE — Telephone Encounter (Signed)
Routing to covering provider to review and advise.   Dr. Quincy Simmonds -please advise?

## 2017-06-02 NOTE — Telephone Encounter (Signed)
Spoke with patient, advised as seen below per Dr. Quincy Simmonds. Patient states she will keep colpo as scheduled, will cancel 5k if needed. Patient will discuss further after colpo with Dr. Sabra Heck on 5/17. Patient thankful for return call and verbalizes understanding.  Routing to provider for final review. Patient is agreeable to disposition. Will close encounter.

## 2017-06-04 ENCOUNTER — Other Ambulatory Visit: Payer: Self-pay

## 2017-06-04 ENCOUNTER — Ambulatory Visit (INDEPENDENT_AMBULATORY_CARE_PROVIDER_SITE_OTHER): Payer: 59 | Admitting: Obstetrics & Gynecology

## 2017-06-04 ENCOUNTER — Encounter: Payer: Self-pay | Admitting: Obstetrics & Gynecology

## 2017-06-04 VITALS — BP 118/78 | HR 86 | Resp 14 | Ht 66.0 in | Wt 223.0 lb

## 2017-06-04 DIAGNOSIS — R87612 Low grade squamous intraepithelial lesion on cytologic smear of cervix (LGSIL): Secondary | ICD-10-CM | POA: Diagnosis not present

## 2017-06-04 DIAGNOSIS — Z01812 Encounter for preprocedural laboratory examination: Secondary | ICD-10-CM

## 2017-06-04 DIAGNOSIS — R87810 Cervical high risk human papillomavirus (HPV) DNA test positive: Secondary | ICD-10-CM

## 2017-06-04 DIAGNOSIS — M25552 Pain in left hip: Secondary | ICD-10-CM

## 2017-06-04 LAB — POCT URINE PREGNANCY: PREG TEST UR: NEGATIVE

## 2017-06-04 NOTE — Patient Instructions (Signed)

## 2017-06-04 NOTE — Progress Notes (Addendum)
37 y.o. G0 SWF female here for colposcopy with possible biopsies and/or ECC due to LGSIL Pap with HR HPV obtained 05/06/17.    Patient's last menstrual period was 05/29/2017.          Sexually active: No.  The current method of family planning is abstinence.     Patient has been counseled about results and procedure.  Risks and benefits have bene reviewed including immediate and/or delayed bleeding, infection, cervical scaring from procedure, possibility of needing additional follow up as well as treatment.  rare risks of missing a lesion discussed as well.  All questions answered.  Pt ready to proceed.  BP 118/78 (BP Location: Right Arm, Patient Position: Sitting, Cuff Size: Normal)   Pulse 86   Resp 14   Ht 5\' 6"  (1.676 m)   Wt 223 lb (101.2 kg)   LMP 05/29/2017   BMI 35.99 kg/m   Physical Exam  Constitutional: She is oriented to person, place, and time. She appears well-developed and well-nourished.  Genitourinary: Vagina normal. There is no rash, tenderness, lesion or injury on the right labia. There is no rash, tenderness, lesion or injury on the left labia.    Lymphadenopathy:       Right: No inguinal adenopathy present.       Left: No inguinal adenopathy present.  Neurological: She is alert and oriented to person, place, and time.  Skin: Skin is warm and dry.  Psychiatric: She has a normal mood and affect.    Speculum placed.  3% acetic acid applied to cervix for >45 seconds.  Cervix visualized with both 7.5X and 15X magnification.  Green filter also used.  Lugols solution was not used.  Findings:  AWE at 7 o'clock.  Biopsy:  Obtained at this location.  ECC:  was performed.  Monsel's was needed.  Excellent hemostasis was present.  Pt tolerated procedure well and all instruments were removed.  Findings noted above on picture of cervix.  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  Outside records obtained:  Pap 08/23/09 LGSIL pap smear.  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

## 2017-06-08 ENCOUNTER — Telehealth: Payer: Self-pay | Admitting: Obstetrics & Gynecology

## 2017-06-08 NOTE — Telephone Encounter (Signed)
Spoke with patient. Results given. Patient verbalizes understanding. States that she would still like to proceed with a hysterectomy. Advised this is not indicated for CIN 1 on one biopsy. Patient states that she has been going through this for many years and is frustrated still would like to proceed. Advised will notify Dr.Miller and surgery coordinator Lamont Snowball, RN regarding next steps.

## 2017-06-08 NOTE — Telephone Encounter (Signed)
Patient calling for colposcopy results from 06/04/17. Results to [place] for review.

## 2017-06-08 NOTE — Telephone Encounter (Addendum)
Left message to call [place] at [phone].  Encounter closed in error.

## 2017-06-08 NOTE — Telephone Encounter (Signed)
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  The recommendation would be repeat pap and HR HPV in 1 year.  08 recall.  She has some interest in a hysterectomy but as this is not indicated for CIN 1 that has only been seen on biopsy one time.  She has an abnormal pap smear last year but did not have any biopsies done.  Thanks.

## 2017-06-08 NOTE — Telephone Encounter (Signed)
Patient calling for test results. °

## 2017-06-08 NOTE — Telephone Encounter (Signed)
It would be quite helpful, if this her desire, to have documentation of her prior abnormal pap smears.  Can she help with this?

## 2017-06-09 NOTE — Telephone Encounter (Signed)
Spoke with patient. Patient will call to have records released with all prior abnormal pap smears to the office.   Routing to provider for final review. Patient agreeable to disposition. Will close encounter.

## 2017-06-25 ENCOUNTER — Encounter: Payer: Self-pay | Admitting: Obstetrics & Gynecology

## 2017-06-25 ENCOUNTER — Ambulatory Visit (INDEPENDENT_AMBULATORY_CARE_PROVIDER_SITE_OTHER): Payer: 59 | Admitting: Obstetrics & Gynecology

## 2017-06-25 ENCOUNTER — Other Ambulatory Visit: Payer: Self-pay

## 2017-06-25 VITALS — BP 108/68 | HR 68 | Resp 16 | Ht 66.75 in | Wt 223.0 lb

## 2017-06-25 DIAGNOSIS — R87618 Other abnormal cytological findings on specimens from cervix uteri: Secondary | ICD-10-CM

## 2017-06-25 DIAGNOSIS — R102 Pelvic and perineal pain: Secondary | ICD-10-CM | POA: Diagnosis not present

## 2017-06-25 NOTE — Progress Notes (Signed)
GYNECOLOGY  VISIT  CC:   Discuss possible hysterectomy  HPI: 37 y.o. G0P0000 Single Caucasian female here for discussion of hysterectomy.  Has seen Raliegh Ip.  Was started on prednisone but has decided not to take this as she does not like the way it makes her feel.    She is very interested in having a hysterectomy.    GYNECOLOGIC HISTORY: Patient's last menstrual period was 05/29/2017. Contraception: abstinance Menopausal hormone therapy: none  Patient Active Problem List   Diagnosis Date Noted  . Anxiety and depression 09/23/2012  . Hx of migraine headaches 09/23/2012  . Tobacco use disorder 09/23/2012  . Nausea with vomiting, chronic - followed by Dr. Deatra Ina and GI 12/16/2011    Past Medical History:  Diagnosis Date  . Chicken pox   . Depression   . Esophagitis 12/24/2011  . Frequent headaches   . GERD (gastroesophageal reflux disease)   . Hemorrhage of rectum and anus 12/16/2011  . Migraines   . Migraines   . Nausea with vomiting, chronic - followed by Dr. Deatra Ina and GI 12/16/2011  . Seasonal allergies   . UTI (urinary tract infection)     Past Surgical History:  Procedure Laterality Date  . ESOPHAGOGASTRODUODENOSCOPY  12/24/2011   Procedure: ESOPHAGOGASTRODUODENOSCOPY (EGD);  Surgeon: Inda Castle, MD;  Location: [facility];  Service: Endoscopy;  Laterality: N/A;  . FOOT SURGERY      MEDS:   Current Outpatient Medications on File Prior to Visit  Medication Sig Dispense Refill  . baclofen (LIORESAL) 10 MG tablet Take 2 tablets by mouth daily as needed.    . cetirizine (ZYRTEC) 10 MG tablet Take 1 tablet (10 mg total) by mouth daily. 30 tablet 11  . ibuprofen (ADVIL,MOTRIN) 800 MG tablet Take 1 tablet (800 mg total) by mouth 3 (three) times daily as needed. 60 tablet 1  . propranolol (INDERAL) 10 MG tablet Take 10 mg by mouth as needed.     Marland Kitchen EPINEPHrine 0.3 mg/0.3 mL IJ SOAJ injection Inject 0.3 mg into the muscle once.    . predniSONE (STERAPRED  UNI-PAK 48 TAB) 10 MG (48) TBPK tablet     . zolpidem (AMBIEN) 10 MG tablet Take 1 tablet by mouth at bedtime.     No current facility-administered medications on file prior to visit.     ALLERGIES: Codeine; Shellfish allergy; Aspirin; and Iodine  Family History  Problem Relation Age of Onset  . Leukemia Paternal Grandfather   . Lung cancer Paternal Grandfather   . Arthritis Paternal Grandfather   . Diabetes Paternal Grandfather   . Heart disease Paternal Grandfather   . Hypertension Paternal Grandfather   . Kidney disease Paternal Grandfather   . Prostate cancer Paternal Grandfather   . Gaucher's disease Paternal Grandfather   . Sudden death Father        age 2, nothing found on autopsy  . Diabetes Father   . Hyperlipidemia Father   . Hypertension Father   . Mental illness Mother        bipolar, schizophrenia  . Alcohol abuse Maternal Grandfather   . Stroke Paternal Grandmother   . Colon cancer Neg Hx     SH:  Single, non smoker  Review of Systems  Constitutional: Negative.   Respiratory: Negative.   Cardiovascular: Negative.   Genitourinary:       Pelvic pain    PHYSICAL EXAMINATION:    BP 108/68   Pulse 68   Resp 16   Ht 5' 6.75" (  1.695 m)   Wt 223 lb (101.2 kg)   LMP 05/29/2017   BMI 35.19 kg/m     General appearance: alert, cooperative and appears stated age No physical exam performed  Assessment: Abnormal pap smears Pelvic pain  Plan: Desirous of hysterectomy   ~25 minutes spent with patient >50% of time was in face to face discussion of above.

## 2017-07-08 ENCOUNTER — Telehealth: Payer: Self-pay | Admitting: Obstetrics & Gynecology

## 2017-07-08 NOTE — Telephone Encounter (Signed)
Patient stated that she was told someone would be getting in touch with her about scheduling surgery. Patient has not heard anything since her appointment and was calling to follow up.

## 2017-07-08 NOTE — Telephone Encounter (Signed)
Call to patient. Reviewed surgery date options. Patient interested in late August after a family wedding. Dates for August discussed.  Patient will discuss with business office and decide on date.

## 2017-07-08 NOTE — Telephone Encounter (Signed)
Spoke with patient regarding benefit for surgery. Patient understood benefit information presented.  Patient aware this is professional benefit only. Patient aware will be contacted by hospital for separate benefits, once a surgery date has been established. Patient requested the phone number to the [facility] to call and discuss benefits. Patient provided the phone number of [phone] for the [place]. Patient advises she will call back to advise how she would like to proceed.   cc: Lamont Snowball, RN

## 2017-08-05 NOTE — Telephone Encounter (Signed)
Call to patient. Per ROI can leave message on cell number which confirms " Betty Leonard."  Left message calling to follow-up on plan for scheduling surgery in late August.S Requested call back with update.

## 2017-08-06 NOTE — Telephone Encounter (Signed)
Call from patient. Now has concerns about proceeding with hysterectomy after research on-line. Asked if was necessary to proceed now. Recommended office visit to discuss further with Dr Sabra Heck. Patient declines. Discussed LGSIL will need to have follow-up pap in one year but does not require immediate surgery. Pelvic pain is dependent on her pain tolerance. Scheduled annual exam for 05-22-17. 08 recall entered.   Advised Dr Sabra Heck will review call and we will call back if additional recommendeations

## 2017-08-06 NOTE — Telephone Encounter (Signed)
It is completely fine to wait and not proceed with surgery.  She desired to proceed with surgery due to pain.  She can continue to watch this as she's had a follow-up ultrasound that was normal and proceed with the repeat pap and HR HPV testing in one year.  I am completely fine with her decision.

## 2017-12-09 ENCOUNTER — Encounter (HOSPITAL_COMMUNITY): Payer: Self-pay

## 2017-12-09 ENCOUNTER — Emergency Department (HOSPITAL_COMMUNITY)
Admission: EM | Admit: 2017-12-09 | Discharge: 2017-12-09 | Disposition: A | Discharge status: Home / Self Care | Payer: 59 | Attending: Emergency Medicine | Admitting: Emergency Medicine

## 2017-12-09 DIAGNOSIS — Z87891 Personal history of nicotine dependence: Secondary | ICD-10-CM | POA: Insufficient documentation

## 2017-12-09 DIAGNOSIS — R55 Syncope and collapse: Secondary | ICD-10-CM

## 2017-12-09 LAB — URINALYSIS, ROUTINE W REFLEX MICROSCOPIC
Bilirubin Urine: NEGATIVE
Glucose, UA: NEGATIVE mg/dL
KETONES UR: NEGATIVE mg/dL
Leukocytes, UA: NEGATIVE
Nitrite: NEGATIVE
PROTEIN: NEGATIVE mg/dL
Specific Gravity, Urine: 1.019 (ref 1.005–1.030)
pH: 5 (ref 5.0–8.0)

## 2017-12-09 LAB — CBC
HEMATOCRIT: 45.8 % (ref 36.0–46.0)
Hemoglobin: 13.5 g/dL (ref 12.0–15.0)
MCH: 25.2 pg — AB (ref 26.0–34.0)
MCHC: 29.5 g/dL — AB (ref 30.0–36.0)
MCV: 85.6 fL (ref 80.0–100.0)
Platelets: 373 10*3/uL (ref 150–400)
RBC: 5.35 MIL/uL — AB (ref 3.87–5.11)
RDW: 14.3 % (ref 11.5–15.5)
WBC: 13.3 10*3/uL — ABNORMAL HIGH (ref 4.0–10.5)
nRBC: 0 % (ref 0.0–0.2)

## 2017-12-09 LAB — I-STAT BETA HCG BLOOD, ED (MC, WL, AP ONLY)

## 2017-12-09 LAB — BASIC METABOLIC PANEL
Anion gap: 10 (ref 5–15)
BUN: 13 mg/dL (ref 6–20)
CO2: 18 mmol/L — AB (ref 22–32)
Calcium: 9.1 mg/dL (ref 8.9–10.3)
Chloride: 108 mmol/L (ref 98–111)
Creatinine, Ser: 0.76 mg/dL (ref 0.44–1.00)
GFR calc Af Amer: >60 mL/min (ref >60)
GFR calc non Af Amer: >60 mL/min (ref >60)
GLUCOSE: 88 mg/dL (ref 70–99)
Potassium: 3.9 mmol/L (ref 3.5–5.1)
Sodium: 136 mmol/L (ref 135–145)

## 2017-12-09 MED ORDER — SODIUM CHLORIDE 0.9 % IV BOLUS: 1000.0000 mL | Freq: Once | INTRAVENOUS | Status: DC

## 2017-12-09 NOTE — ED Notes (Signed)
Patient verbalizes understanding of discharge instructions. Opportunity for questioning and answers were provided. Armband removed by staff, pt discharged from ED.  

## 2017-12-09 NOTE — ED Notes (Signed)
Missed IV x2.

## 2017-12-09 NOTE — ED Provider Notes (Signed)
[facility] Emergency Department Provider Note MRN:  [id number]  Arrival date & time: 12/09/17     Chief Complaint   Loss of Consciousness   History of Present Illness   Betty Leonard is a 37 y.o. year-old female with a history of depression presenting to the ED with chief complaint of syncope.  Patient explains that she felt general malaise yesterday, intermittent lightheadedness.  Got the flu shot yesterday.  Denies any fevers or flulike symptoms recently.  Continued to feel lightheaded this morning, walked into work, while in the elevator had a witnessed syncopal episode.  Patient explains that prior to passing out she felt nauseated, lightheaded.  Brief loss of consciousness, quick return to baseline.  Denies any numbness or weakness prior to or after the syncopal episode.  No chest pain, no shortness of breath.  No recent pain or swelling to the legs, no recent travel.  Patient explains she has a history of anemia and was seen by her PCP yesterday for this.  Review of Systems  A complete 10 system review of systems was obtained and all systems are negative except as noted in the HPI and PMH.   Patient's Health History    Past Medical History:  Diagnosis Date  . Chicken pox   . Depression   . Esophagitis 12/24/2011  . Frequent headaches   . GERD (gastroesophageal reflux disease)   . Hemorrhage of rectum and anus 12/16/2011  . Migraines   . Migraines   . Nausea with vomiting, chronic - followed by Dr. Deatra Ina and GI 12/16/2011  . Seasonal allergies   . UTI (urinary tract infection)     Past Surgical History:  Procedure Laterality Date  . ESOPHAGOGASTRODUODENOSCOPY  12/24/2011   Procedure: ESOPHAGOGASTRODUODENOSCOPY (EGD);  Surgeon: Inda Castle, MD;  Location: [facility];  Service: Endoscopy;  Laterality: N/A;  . FOOT SURGERY Right    repair remotely after a fracture  . KNEE ARTHROSCOPY Left 2018    Family History  Problem Relation Age of Onset  .  Leukemia Paternal Grandfather   . Lung cancer Paternal Grandfather   . Arthritis Paternal Grandfather   . Diabetes Paternal Grandfather   . Heart disease Paternal Grandfather   . Hypertension Paternal Grandfather   . Kidney disease Paternal Grandfather   . Prostate cancer Paternal Grandfather   . Gaucher's disease Paternal Grandfather   . Sudden death Father        age 18, nothing found on autopsy  . Diabetes Father   . Hyperlipidemia Father   . Hypertension Father   . Mental illness Mother        bipolar, schizophrenia  . Alcohol abuse Maternal Grandfather   . Stroke Paternal Grandmother   . Colon cancer Neg Hx     Social History   Socioeconomic History  . Marital status: Single    Spouse name: Not on file  . Number of children: Not on file  . Years of education: Not on file  . Highest education level: Not on file  Occupational History  . Not on file  Social Needs  . Financial resource strain: Not on file  . Food insecurity:    Worry: Not on file    Inability: Not on file  . Transportation needs:    Medical: Not on file    Non-medical: Not on file  Tobacco Use  . Smoking status: Former Smoker    Packs/day: 0.50    Years: 5.00    Pack  years: 2.50    Types: Cigarettes  . Smokeless tobacco: Never Used  Substance and Sexual Activity  . Alcohol use: Yes    Alcohol/week: 0.0 standard drinks    Comment: occ  . Drug use: No  . Sexual activity: Not Currently    Birth control/protection: None  Lifestyle  . Physical activity:    Days per week: Not on file    Minutes per session: Not on file  . Stress: Not on file  Relationships  . Social connections:    Talks on phone: Not on file    Gets together: Not on file    Attends religious service: Not on file    Active member of club or organization: Not on file    Attends meetings of clubs or organizations: Not on file    Relationship status: Not on file  . Intimate partner violence:    Fear of current or ex partner:  Not on file    Emotionally abused: Not on file    Physically abused: Not on file    Forced sexual activity: Not on file  Other Topics Concern  . 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 with roomate      Spiritual Beliefs: Christian      Lifestyle: CV exercise (33minutes) 3 days per week; diet is poor                 Physical Exam  Vital Signs and Nursing Notes reviewed Vitals:   12/09/17 0915 12/09/17 0930  BP: 100/85   Pulse: 75 73  Resp: (!) 23 18  Temp:    SpO2: 98% 99%    CONSTITUTIONAL: Well-appearing, NAD NEURO:  Alert and oriented x 3, no focal deficits EYES:  eyes equal and reactive ENT/NECK:  no LAD, no JVD CARDIO: Regular rate, well-perfused, normal S1 and S2 PULM:  CTAB no wheezing or rhonchi GI/GU:  normal bowel sounds, non-distended, non-tender MSK/SPINE:  No gross deformities, no edema SKIN:  no rash, atraumatic PSYCH:  Appropriate speech and behavior  Diagnostic and Interventional Summary    EKG Interpretation  Date/Time:  Thursday December 09 2017 08:31:29 EST Ventricular Rate:  82 PR Interval:    QRS Duration: 100 QT Interval:  364 QTC Calculation: 426 R Axis:   36 Text Interpretation:  Sinus rhythm Confirmed by Gerlene Fee [phone]) on 12/09/2017 8:51:45 AM      Labs Reviewed  CBC - Abnormal; Notable for the following components:      Result Value   WBC 13.3 (*)    RBC 5.35 (*)    MCH 25.2 (*)    MCHC 29.5 (*)    All other components within normal limits  BASIC METABOLIC PANEL - Abnormal; Notable for the following components:   CO2 18 (*)    All other components within normal limits  URINALYSIS, ROUTINE W REFLEX MICROSCOPIC - Abnormal; Notable for the following components:   APPearance HAZY (*)    Hgb urine dipstick SMALL (*)    Bacteria, UA FEW (*)    All other components within normal limits  I-STAT BETA HCG BLOOD, ED ([facility], [facility], [facility] ONLY)    No orders to display      Medications  sodium chloride 0.9 % bolus 1,000 mL (1,000 mLs Intravenous Not Given 12/09/17 1046)     Procedures Critical Care  ED Course and Medical Decision Making  I have reviewed the triage  vital signs and the nursing notes.  Pertinent labs & imaging results that were available during my care of the patient were reviewed by me and considered in my medical decision making (see below for details).  Favoring syncopal episode related to patient's history of anemia.  Currently vital signs stable, very well-appearing, no focal neurological deficits to suggest CNS pathology.  EKG with no concerning features.  Will provide 1 L IV fluids here, evaluate with labs, reassess.  EKG reassuring, labs within normal limits.  Patient is not orthostatic here in the ED, ambulating without issue.  IV fluids offered but patient not interested.  Patient explains she had a history of vasovagal syncope in the past.  Favoring this given the lack of other concerning features.  Continues to have normal neurological exam.  Will follow-up with PCP.  Strict return precautions.  After the discussed management above, the patient was determined to be safe for discharge.  The patient was in agreement with this plan and all questions regarding their care were answered.  ED return precautions were discussed and the patient will return to the ED with any significant worsening of condition.  Barth Kirks. Sedonia Small, [place] [email or web address]  Final Clinical Impressions(s) / ED Diagnoses     ICD-10-CM   1. Syncope, unspecified syncope type R55     ED Discharge Orders    None         Maudie Flakes, MD 12/09/17 1055

## 2017-12-09 NOTE — Discharge Instructions (Signed)
You were evaluated in the Emergency Department and after careful evaluation, we did not find any emergent condition requiring admission or further testing in the hospital.  Your symptoms today seem to be due to vasovagal syncope.  Please follow-up with your primary care provider to discuss your ED visit.  Please return to the Emergency Department if you experience any worsening of your condition.  We encourage you to follow up with a primary care provider.  Thank you for allowing [place] to be a part of your care.

## 2017-12-09 NOTE — ED Triage Notes (Signed)
Pt presents via [facility] for evaluation of syncopal event at work today. Syncope was witnessed by coworkers, hit head on wall. Pt had flu shot yesterday and started feeling "off" last night. Pt alert and oriented x4.

## 2017-12-09 NOTE — ED Notes (Signed)
IV team unable to get IV 

## 2017-12-12 ENCOUNTER — Other Ambulatory Visit: Payer: Self-pay

## 2017-12-12 ENCOUNTER — Ambulatory Visit (INDEPENDENT_AMBULATORY_CARE_PROVIDER_SITE_OTHER): Payer: 59

## 2017-12-12 ENCOUNTER — Ambulatory Visit (HOSPITAL_COMMUNITY)
Admission: EM | Admit: 2017-12-12 | Discharge: 2017-12-12 | Disposition: A | Discharge status: Home / Self Care | Payer: 59 | Attending: Family Medicine | Admitting: Family Medicine

## 2017-12-12 ENCOUNTER — Encounter (HOSPITAL_COMMUNITY): Payer: Self-pay

## 2017-12-12 DIAGNOSIS — S300XXA Contusion of lower back and pelvis, initial encounter: Secondary | ICD-10-CM

## 2017-12-12 MED ORDER — TRAMADOL HCL 50 MG PO TABS: 50.0000 mg | ORAL_TABLET | Freq: Four times a day (QID) | ORAL | 0 refills | Status: DC | PRN

## 2017-12-12 NOTE — Discharge Instructions (Addendum)
Usually a soft donut cushion is helpful for sitting.  I have written a prescription for pain medicine to help with sleep.  Usually the pain subsides in a week to 10 days.

## 2017-12-12 NOTE — ED Provider Notes (Signed)
[facility]    CSN: [id number] Arrival date & time: 12/12/17  1348     History   Chief Complaint Chief Complaint  Patient presents with  . tailbone pain    HPI Betty Leonard is a 37 y.o. female.   This is an initial visit for this 37 year old woman who fell and injured her coccyx.  Pt states she passed out In the elevator at work and pt thinks she broke her tailbone this happened this past Thursday.  He was taken to the emergency room where she was evaluated for syncope.  She was told she had anemia and it was possible that the flu shot she had received recently had precipitated the syncopal event.  Patient was supposed to get IV fluids, but after multiple sticks on her forearm, patient refused the IV fluids and said she preferred taking p.o. since she was not vomiting or even nauseated.  Although patient was not aware of pain at first, she gradually became more more aware of the sacral pain as she stayed in the emergency department.  When she woke up the next day, she had difficulty sitting.     Past Medical History:  Diagnosis Date  . Chicken pox   . Depression   . Esophagitis 12/24/2011  . Frequent headaches   . GERD (gastroesophageal reflux disease)   . Hemorrhage of rectum and anus 12/16/2011  . Migraines   . Migraines   . Nausea with vomiting, chronic - followed by Dr. Deatra Ina and GI 12/16/2011  . Seasonal allergies   . UTI (urinary tract infection)     Patient Active Problem List   Diagnosis Date Noted  . Anxiety and depression 09/23/2012  . Hx of migraine headaches 09/23/2012  . Tobacco use disorder 09/23/2012  . Nausea with vomiting, chronic - followed by Dr. Deatra Ina and GI 12/16/2011    Past Surgical History:  Procedure Laterality Date  . ESOPHAGOGASTRODUODENOSCOPY  12/24/2011   Procedure: ESOPHAGOGASTRODUODENOSCOPY (EGD);  Surgeon: Inda Castle, MD;  Location: [facility];  Service: Endoscopy;  Laterality: N/A;  . FOOT SURGERY Right    repair remotely after a fracture  . KNEE ARTHROSCOPY Left 2018    OB History    Gravida  0   Para  0   Term  0   Preterm  0   AB  0   Living  0     SAB  0   TAB  0   Ectopic  0   Multiple  0   Live Births  0            Home Medications    Prior to Admission medications   Medication Sig Start Date End Date Taking? Authorizing Provider  baclofen (LIORESAL) 10 MG tablet Take 2 tablets by mouth daily as needed.    [provider]  cetirizine (ZYRTEC) 10 MG tablet Take 1 tablet (10 mg total) by mouth daily. 06/07/14   Brewington, Tishira R, PA-C  EPINEPHrine 0.3 mg/0.3 mL IJ SOAJ injection Inject 0.3 mg into the muscle once.    [provider]  ibuprofen (ADVIL,MOTRIN) 800 MG tablet Take 1 tablet (800 mg total) by mouth 3 (three) times daily as needed. 05/06/17   Megan Salon, MD  predniSONE (STERAPRED UNI-PAK 48 TAB) 10 MG (48) TBPK tablet  06/17/17   [provider]  propranolol (INDERAL) 10 MG tablet Take 10 mg by mouth as needed.     [provider]  traMADol Veatrice Bourbon)  50 MG tablet Take 1 tablet (50 mg total) by mouth every 6 (six) hours as needed. 12/12/17   Robyn Haber, MD  zolpidem (AMBIEN) 10 MG tablet Take 1 tablet by mouth at bedtime. 08/22/16   [provider]    Family History Family History  Problem Relation Age of Onset  . Leukemia Paternal Grandfather   . Lung cancer Paternal Grandfather   . Arthritis Paternal Grandfather   . Diabetes Paternal Grandfather   . Heart disease Paternal Grandfather   . Hypertension Paternal Grandfather   . Kidney disease Paternal Grandfather   . Prostate cancer Paternal Grandfather   . Gaucher's disease Paternal Grandfather   . Sudden death Father        age 36, nothing found on autopsy  . Diabetes Father   . Hyperlipidemia Father   . Hypertension Father   . Mental illness Mother        bipolar, schizophrenia  . Alcohol abuse Maternal Grandfather   . Stroke Paternal  Grandmother   . Colon cancer Neg Hx     Social History Social History   Tobacco Use  . Smoking status: Former Smoker    Packs/day: 0.50    Years: 5.00    Pack years: 2.50    Types: Cigarettes  . Smokeless tobacco: Never Used  Substance Use Topics  . Alcohol use: Yes    Alcohol/week: 0.0 standard drinks    Comment: occ  . Drug use: No     Allergies   Codeine; Shellfish allergy; Aspirin; and Iodine   Review of Systems Review of Systems   Physical Exam Triage Vital Signs ED Triage Vitals  Enc Vitals Group     BP 12/12/17 1543 109/63     Pulse Rate 12/12/17 1543 90     Resp 12/12/17 1543 18     Temp 12/12/17 1543 97.9 F (36.6 C)     Temp src --      SpO2 12/12/17 1543 100 %     Weight 12/12/17 1542 212 lb (96.2 kg)     Height --      Head Circumference --      Peak Flow --      Pain Score 12/12/17 1541 7     Pain Loc --      Pain Edu? --      Excl. in [place]? --    No data found.  Updated Vital Signs BP 109/63   Pulse 90   Temp 97.9 F (36.6 C)   Resp 18   Wt 96.2 kg   LMP 11/20/2017 (Approximate)   SpO2 100%   BMI 33.45 kg/m   Physical Exam  Constitutional: She appears well-developed and well-nourished.  HENT:  Head: Atraumatic.  Right Ear: External ear normal.  Left Ear: External ear normal.  Eyes: Pupils are equal, round, and reactive to light. Conjunctivae are normal.  Neck: Normal range of motion. Neck supple.  Cardiovascular: Normal rate, regular rhythm and normal heart sounds.  Pulmonary/Chest: Effort normal and breath sounds normal.  Musculoskeletal: Normal range of motion.  Very tender mid sacrum  Neurological: She is alert.  Skin: Skin is warm.  Multiple ecchymoses on her forearms her IVs were attempted to be started  No ecchymoses in the gluteal cleft but patient is tender in the mid sacrum.  Psychiatric:  Patient somewhat anxious.  Nursing note and vitals reviewed.    UC Treatments / Results  Labs (all labs ordered are  listed, but only abnormal results  are displayed) Labs Reviewed - No data to display  EKG None  Radiology No results found.  Procedures Procedures (including critical care time)  Medications Ordered in UC Medications - No data to display  Initial Impression / Assessment and Plan / UC Course  I have reviewed the triage vital signs and the nursing notes.  Pertinent labs & imaging results that were available during my care of the patient were reviewed by me and considered in my medical decision making (see chart for details).    Final Clinical Impressions(s) / UC Diagnoses   Final diagnoses:  Contusion of sacrum, initial encounter     Discharge Instructions     Usually a soft donut cushion is helpful for sitting.  I have written a prescription for pain medicine to help with sleep.  Usually the pain subsides in a week to 10 days.    ED Prescriptions    Medication Sig Dispense Auth. Provider   traMADol (ULTRAM) 50 MG tablet Take 1 tablet (50 mg total) by mouth every 6 (six) hours as needed. 15 tablet Robyn Haber, MD     Controlled Substance Prescriptions [place] Controlled Substance Registry consulted? Not Applicable   Robyn Haber, MD 12/12/17 1640

## 2017-12-12 NOTE — ED Triage Notes (Signed)
Pt states she passed out I  The elevator and pt thinks she broke her tailbone this happened this past Thursday.

## 2018-01-24 ENCOUNTER — Other Ambulatory Visit: Payer: Self-pay | Admitting: Podiatry

## 2018-01-24 ENCOUNTER — Encounter: Payer: Self-pay | Admitting: Podiatry

## 2018-01-24 ENCOUNTER — Ambulatory Visit (INDEPENDENT_AMBULATORY_CARE_PROVIDER_SITE_OTHER): Payer: 59 | Admitting: Podiatry

## 2018-01-24 ENCOUNTER — Ambulatory Visit (INDEPENDENT_AMBULATORY_CARE_PROVIDER_SITE_OTHER): Payer: 59

## 2018-01-24 VITALS — BP 116/67 | HR 101

## 2018-01-24 DIAGNOSIS — L84 Corns and callosities: Secondary | ICD-10-CM

## 2018-01-24 DIAGNOSIS — M79671 Pain in right foot: Secondary | ICD-10-CM

## 2018-01-24 DIAGNOSIS — M7751 Other enthesopathy of right foot: Secondary | ICD-10-CM

## 2018-01-24 DIAGNOSIS — T8484XA Pain due to internal orthopedic prosthetic devices, implants and grafts, initial encounter: Secondary | ICD-10-CM

## 2018-01-24 NOTE — Patient Instructions (Signed)
Pre-Operative Instructions  Congratulations, you have decided to take an important step towards improving your quality of life.  You can be assured that the doctors and staff at [facility] will be with you every step of the way.  Here are some important things you should know:  1. Plan to be at the surgery center/hospital at least 1 (one) hour prior to your scheduled time, unless otherwise directed by the surgical center/hospital staff.  You must have a responsible adult accompany you, remain during the surgery and drive you home.  Make sure you have directions to the surgical center/hospital to ensure you arrive on time. 2. If you are having surgery at [facility] or [facility], you will need a copy of your medical history and physical form from your family physician within one month prior to the date of surgery. We will give you a form for your primary physician to complete.  3. We make every effort to accommodate the date you request for surgery.  However, there are times where surgery dates or times have to be moved.  We will contact you as soon as possible if a change in schedule is required.   4. No aspirin/ibuprofen for one week before surgery.  If you are on aspirin, any non-steroidal anti-inflammatory medications (Mobic, Aleve, Ibuprofen) should not be taken seven (7) days prior to your surgery.  You make take Tylenol for pain prior to surgery.  5. Medications - If you are taking daily heart and blood pressure medications, seizure, reflux, allergy, asthma, anxiety, pain or diabetes medications, make sure you notify the surgery center/hospital before the day of surgery so they can tell you which medications you should take or avoid the day of surgery. 6. No food or drink after midnight the night before surgery unless directed otherwise by surgical center/hospital staff. 7. No alcoholic beverages 24-hours prior to surgery.  No smoking 24-hours prior or 24-hours after  surgery. 8. Wear loose pants or shorts. They should be loose enough to fit over bandages, boots, and casts. 9. Don't wear slip-on shoes. Sneakers are preferred. 10. Bring your boot with you to the surgery center/hospital.  Also bring crutches or a walker if your physician has prescribed it for you.  If you do not have this equipment, it will be provided for you after surgery. 11. If you have not been contacted by the surgery center/hospital by the day before your surgery, call to confirm the date and time of your surgery. 12. Leave-time from work may vary depending on the type of surgery you have.  Appropriate arrangements should be made prior to surgery with your employer. 13. Prescriptions will be provided immediately following surgery by your doctor.  Fill these as soon as possible after surgery and take the medication as directed. Pain medications will not be refilled on weekends and must be approved by the doctor. 14. Remove nail polish on the operative foot and avoid getting pedicures prior to surgery. 15. Wash the night before surgery.  The night before surgery wash the foot and leg well with water and the antibacterial soap provided. Be sure to pay special attention to beneath the toenails and in between the toes.  Wash for at least three (3) minutes. Rinse thoroughly with water and dry well with a towel.  Perform this wash unless told not to do so by your physician.  Enclosed: 1 Ice pack (please put in freezer the night before surgery)   1 Hibiclens skin cleaner     Pre-op instructions  If you have any questions regarding the instructions, please do not hesitate to call our office.  [place]: [address], [place], [place] [address] -- [phone]  [place]: [address] [place], [place] [address] -- [phone]  [place]: [address]  [place], [place] [address] -- [phone]  [place]: [address], [address], [place], [place] [address] -- [phone]  Website:  [email or web address] 

## 2018-01-26 NOTE — Progress Notes (Signed)
   Subjective: 38 year old female presenting today as a new patient, referred by Dr. Gershon Mussel, with a chief complaint of pain to the plantar aspect of the right foot that began 2-3 months ago. She states she had surgery on the foot back in 2009 and believes this is the cause of her pain. Walking and bearing weight increases the pain. She has not done anything for treatment. Patient is here for further evaluation and treatment.   Past Medical History:  Diagnosis Date  . Chicken pox   . Depression   . Esophagitis 12/24/2011  . Frequent headaches   . GERD (gastroesophageal reflux disease)   . Hemorrhage of rectum and anus 12/16/2011  . Migraines   . Migraines   . Nausea with vomiting, chronic - followed by Dr. Deatra Ina and GI 12/16/2011  . Seasonal allergies   . UTI (urinary tract infection)      Objective:  Physical Exam General: Alert and oriented x3 in no acute distress  Dermatology: Hyperkeratotic lesion present on the sub-third MPJ of the right foot. Pain on palpation with a central nucleated core noted. Skin is warm, dry and supple bilateral lower extremities. Negative for open lesions or macerations.  Vascular: Palpable pedal pulses bilaterally. No edema or erythema noted. Capillary refill within normal limits.  Neurological: Epicritic and protective threshold grossly intact bilaterally.   Musculoskeletal Exam: Pain on palpation at the keratotic lesion noted as well as the 3rd MPJ of the right foot. Range of motion within normal limits bilateral. Muscle strength 5/5 in all groups bilateral.  Radiographic Exam: Hardware noted to the right 2nd and 3rd metatarsals and base of 1st metatarsal.   Assessment: 1. 3rd MPJ capsulitis right 2. Preulcerative callus lesion noted to the 3rd MPJ right 3. Symptomatic hardware right   Plan of Care:  1. Patient evaluated 2. Excisional debridement of keratoic lesion using a chisel blade was performed without incident.  3. Dressed area with light  dressing. 4. Today we discussed the conservative versus surgical management of the presenting pathology. The patient opts for surgical management. All possible complications and details of the procedure were explained. All patient questions were answered. No guarantees were expressed or implied. 5. Authorization for surgery was initiated today. Surgery will consist of 3rd metatarsal head resection right; removal of hardware right third metatarsal.  6. CAM boot dispensed.  7. Return to clinic one week post op.     Edrick Kins, DPM [facility]  Dr. Edrick Kins, [address]                                        [place],  [address]                Office [phone]  Fax [phone]

## 2018-01-31 ENCOUNTER — Ambulatory Visit: Payer: 59 | Admitting: Podiatry

## 2018-02-03 ENCOUNTER — Telehealth: Payer: Self-pay | Admitting: Podiatry

## 2018-02-03 ENCOUNTER — Other Ambulatory Visit: Payer: Self-pay | Admitting: Podiatry

## 2018-02-03 ENCOUNTER — Encounter: Payer: Self-pay | Admitting: Podiatry

## 2018-02-03 DIAGNOSIS — M2011 Hallux valgus (acquired), right foot: Secondary | ICD-10-CM

## 2018-02-03 DIAGNOSIS — Z4889 Encounter for other specified surgical aftercare: Secondary | ICD-10-CM | POA: Diagnosis not present

## 2018-02-03 MED ORDER — OXYCODONE-ACETAMINOPHEN 5-325 MG PO TABS: 1.0000 | ORAL_TABLET | ORAL | 0 refills | Status: DC | PRN

## 2018-02-03 MED ORDER — ONDANSETRON HCL 8 MG PO TABS: 8.0000 mg | ORAL_TABLET | Freq: Three times a day (TID) | ORAL | 0 refills | Status: DC | PRN

## 2018-02-03 NOTE — Progress Notes (Signed)
.  postop

## 2018-02-03 NOTE — Telephone Encounter (Signed)
Pt has not received pain medication, she went to her pharmacy and it was not there. Please call patient

## 2018-02-03 NOTE — Telephone Encounter (Signed)
Left message [facility] [address] informing that Dr. Amalia Hailey had been confirmed received at their pharmacy today at 11:14am.

## 2018-02-03 NOTE — Telephone Encounter (Signed)
This is [facility] calling about prescriptions we have not received from patients outpatient surgery today. Please call me back at [phone].

## 2018-02-09 ENCOUNTER — Ambulatory Visit (INDEPENDENT_AMBULATORY_CARE_PROVIDER_SITE_OTHER): Payer: 59

## 2018-02-09 ENCOUNTER — Ambulatory Visit (INDEPENDENT_AMBULATORY_CARE_PROVIDER_SITE_OTHER): Payer: 59 | Admitting: Podiatry

## 2018-02-09 VITALS — Temp 97.1°F

## 2018-02-09 DIAGNOSIS — L84 Corns and callosities: Secondary | ICD-10-CM

## 2018-02-09 DIAGNOSIS — Z09 Encounter for follow-up examination after completed treatment for conditions other than malignant neoplasm: Secondary | ICD-10-CM

## 2018-02-09 DIAGNOSIS — T8484XA Pain due to internal orthopedic prosthetic devices, implants and grafts, initial encounter: Secondary | ICD-10-CM

## 2018-02-09 DIAGNOSIS — M7751 Other enthesopathy of right foot: Secondary | ICD-10-CM

## 2018-02-14 ENCOUNTER — Encounter: Payer: Self-pay | Admitting: Podiatry

## 2018-02-14 ENCOUNTER — Ambulatory Visit (INDEPENDENT_AMBULATORY_CARE_PROVIDER_SITE_OTHER): Payer: 59 | Admitting: Podiatry

## 2018-02-14 DIAGNOSIS — M7751 Other enthesopathy of right foot: Secondary | ICD-10-CM

## 2018-02-14 DIAGNOSIS — L84 Corns and callosities: Secondary | ICD-10-CM

## 2018-02-14 DIAGNOSIS — Z9889 Other specified postprocedural states: Secondary | ICD-10-CM

## 2018-02-14 NOTE — Progress Notes (Signed)
   Subjective:  Patient presents today status post third metatarsal head resection of removal of hardware right foot.. DOS: 02/03/2018.  Patient states that she did notice a little bit of bleeding Saturday.  She has little to no pain.  She does have some burning at the top of the incision site.  Patient has no new complaints today.  Past Medical History:  Diagnosis Date  . Chicken pox   . Depression   . Esophagitis 12/24/2011  . Frequent headaches   . GERD (gastroesophageal reflux disease)   . Hemorrhage of rectum and anus 12/16/2011  . Migraines   . Migraines   . Nausea with vomiting, chronic - followed by Dr. Deatra Ina and GI 12/16/2011  . Seasonal allergies   . UTI (urinary tract infection)       Objective/Physical Exam Neurovascular status intact.  Skin incisions appear to be well coapted with sutures and staples intact. No sign of infectious process noted. No dehiscence. No active bleeding noted. Moderate edema noted to the surgical extremity.  There is some fluctuance noted possibly due to underlying hematoma of the incision site.  Assessment: 1. s/p third metatarsal head resection with removal of hardware right foot. DOS: 02/03/2018   Plan of Care:  1. Patient was evaluated. 2.  Today dressings were changed.  Compressive dressings were applied with instructions to leave clean dry and intact for 1 week. 3.  Continue weightbearing in the immobilization cam boot 4.  Return to clinic in 1 week for possible suture removal.  If there is still significant amount of fluctuance we may need to go in and drain the hematoma area.   Edrick Kins, DPM [facility]  Dr. Edrick Kins, [address]                                        [place], [place] [address]                Office [phone]  Fax [phone]

## 2018-02-16 ENCOUNTER — Encounter: Payer: 59 | Admitting: Podiatry

## 2018-02-20 NOTE — Progress Notes (Signed)
   Subjective:  Patient presents today status post 3rd metatarsal head resection right. DOS: 02/03/2018. She reports some intermittent throbbing of the foot. She states she has not been taking any pain medication. She denies modifying factors. She has been using the CAM boot as directed. Patient is here for further evaluation and treatment.    Past Medical History:  Diagnosis Date  . Chicken pox   . Depression   . Esophagitis 12/24/2011  . Frequent headaches   . GERD (gastroesophageal reflux disease)   . Hemorrhage of rectum and anus 12/16/2011  . Migraines   . Migraines   . Nausea with vomiting, chronic - followed by Dr. Deatra Ina and GI 12/16/2011  . Seasonal allergies   . UTI (urinary tract infection)       Objective/Physical Exam Neurovascular status intact.  Skin incisions appear to be well coapted with sutures and staples intact. No sign of infectious process noted. No dehiscence. No active bleeding noted. Moderate edema noted to the surgical extremity.  Radiographic Exam:  Orthopedic hardware and osteotomies sites appear to be stable with routine healing.  Assessment: 1. s/p 3rd metatarsal head resection right. DOS: 02/03/2018   Plan of Care:  1. Patient was evaluated. X-rays reviewed 2. Dressing changed. Keep clean, dry and intact for one week.  3. Continue weightbearing in CAM boot.  4. Return to clinic in one week for suture removal.    Edrick Kins, DPM [facility]  Dr. Edrick Kins, [address]                                        [place], [place] [address]                Office [phone]  Fax [phone]

## 2018-02-21 ENCOUNTER — Ambulatory Visit (INDEPENDENT_AMBULATORY_CARE_PROVIDER_SITE_OTHER): Payer: 59 | Admitting: Podiatry

## 2018-02-21 ENCOUNTER — Telehealth: Payer: Self-pay | Admitting: Podiatry

## 2018-02-21 DIAGNOSIS — M7751 Other enthesopathy of right foot: Secondary | ICD-10-CM

## 2018-02-21 DIAGNOSIS — Z9889 Other specified postprocedural states: Secondary | ICD-10-CM

## 2018-02-21 DIAGNOSIS — S90121A Contusion of right lesser toe(s) without damage to nail, initial encounter: Secondary | ICD-10-CM

## 2018-02-21 MED ORDER — CEPHALEXIN 500 MG PO CAPS: 500.0000 mg | ORAL_CAPSULE | Freq: Three times a day (TID) | ORAL | 0 refills | Status: DC

## 2018-02-21 NOTE — Telephone Encounter (Signed)
Patient said you were suppose to call her some medication in today to her Pharmacy(antibiotic) she uses [facility].

## 2018-02-24 ENCOUNTER — Ambulatory Visit (INDEPENDENT_AMBULATORY_CARE_PROVIDER_SITE_OTHER): Payer: 59

## 2018-02-24 VITALS — Temp 97.8°F

## 2018-02-24 DIAGNOSIS — S90121A Contusion of right lesser toe(s) without damage to nail, initial encounter: Secondary | ICD-10-CM

## 2018-02-24 DIAGNOSIS — Z9889 Other specified postprocedural states: Secondary | ICD-10-CM

## 2018-02-27 NOTE — Progress Notes (Signed)
   Subjective:  Patient presents today status post third metatarsal head resection of removal of hardware right foot.. DOS: 02/03/2018.  Last visit of 02/14/2018 there was noted some fluctuance possibly underlying hematoma to the incision site.  It is still present today.  She continues to have some pain to the area.  She presents for further treatment evaluation  Past Medical History:  Diagnosis Date  . Chicken pox   . Depression   . Esophagitis 12/24/2011  . Frequent headaches   . GERD (gastroesophageal reflux disease)   . Hemorrhage of rectum and anus 12/16/2011  . Migraines   . Migraines   . Nausea with vomiting, chronic - followed by Dr. Deatra Ina and GI 12/16/2011  . Seasonal allergies   . UTI (urinary tract infection)       Objective/Physical Exam Neurovascular status intact.  Skin incisions appear to be well coapted with exception of a small focal area of drainage to the incision site but does appear to have some dehiscence noted.. No sign of infectious process noted. No dehiscence. No active bleeding noted. Moderate edema noted to the surgical extremity.  There is some fluctuance noted possibly due to underlying hematoma of the incision site.  There is a very small focal area of dehiscence along the incision site with drainage.  Assessment: 1. s/p third metatarsal head resection with removal of hardware right foot. DOS: 02/03/2018 2.  Symptomatic hematoma to surgical incision site right foot   Plan of Care:  1. Patient was evaluated. 2.  Today after evaluation there was some pressure applied to the hematoma site and a significant amount of hematoma, sanguinous jelly-like consistency, was expressed from a small area of dehiscence and drainage to the incision site.  Underlying hematoma was completely evacuated. 3.  The area was then packed with iodoform packing and dry sterile dressing was applied. 4.  Prescription for Keflex 500 mg 3 times daily prescribed for prophylaxis 5.  Return  to clinic in 1 week  Edrick Kins, DPM [facility]  Dr. Edrick Kins, [address]                                        [place], [place] [address]                Office [phone]  Fax [phone]

## 2018-02-28 ENCOUNTER — Ambulatory Visit (INDEPENDENT_AMBULATORY_CARE_PROVIDER_SITE_OTHER): Payer: Self-pay | Admitting: Podiatry

## 2018-02-28 DIAGNOSIS — M7751 Other enthesopathy of right foot: Secondary | ICD-10-CM

## 2018-02-28 DIAGNOSIS — Z9889 Other specified postprocedural states: Secondary | ICD-10-CM

## 2018-02-28 DIAGNOSIS — S90121A Contusion of right lesser toe(s) without damage to nail, initial encounter: Secondary | ICD-10-CM

## 2018-02-28 NOTE — Progress Notes (Signed)
Patient is here today for dressing change, she is status post ostectomy of complete met head third and removal fixation K wire screw right foot, date of surgery 02/03/2018.  She states that overall the foot is not hurting and she feels like it is continuing to heal, and is having no complications at this time.  Noted well-healing surgical site, previous site still draining from removal of hematoma.  Sterile bandage was removed, iodoform was packed in to the wound opening.  Dry sterile dressing was replaced.  There is no erythema no redness, no purulent drainage.  She is to follow-up next week for dressing change or sooner with any acute symptom changes.

## 2018-03-02 ENCOUNTER — Encounter: Payer: 59 | Admitting: Podiatry

## 2018-03-04 NOTE — Progress Notes (Signed)
   Subjective:  Patient presents today status post third metatarsal head resection of removal of hardware right foot.. DOS: 02/03/2018. She states she is doing well. She reports that her foot looks and feels significantly better than it did at her previous visit. She has been using the post op shoe as directed. There are no modifying factors noted. Patient is here for further evaluation and treatment.   Past Medical History:  Diagnosis Date  . Chicken pox   . Depression   . Esophagitis 12/24/2011  . Frequent headaches   . GERD (gastroesophageal reflux disease)   . Hemorrhage of rectum and anus 12/16/2011  . Migraines   . Migraines   . Nausea with vomiting, chronic - followed by Dr. Deatra Ina and GI 12/16/2011  . Seasonal allergies   . UTI (urinary tract infection)       Objective/Physical Exam Neurovascular status intact.  Skin incisions appear to be healed. No sign of infectious process noted. No dehiscence. No active bleeding noted. Moderate edema noted to the surgical extremity.   Assessment: 1. s/p third metatarsal head resection with removal of hardware right foot. DOS: 02/03/2018 2.  Symptomatic hematoma to surgical incision site right foot - resolved   Plan of Care:  1. Patient was evaluated. 2. Recommended good shoe gear. Discontinue using post op shoe.  3. Compression anklet dispensed.  4. Return to clinic in 4 weeks.   Edrick Kins, DPM [facility]  Dr. Edrick Kins, [address]                                        [place], [place] [address]                Office [phone]  Fax [phone]

## 2018-03-28 ENCOUNTER — Ambulatory Visit (INDEPENDENT_AMBULATORY_CARE_PROVIDER_SITE_OTHER): Payer: 59 | Admitting: Podiatry

## 2018-03-28 ENCOUNTER — Encounter: Payer: Self-pay | Admitting: Podiatry

## 2018-03-28 DIAGNOSIS — Z9889 Other specified postprocedural states: Secondary | ICD-10-CM

## 2018-03-31 NOTE — Progress Notes (Signed)
   Subjective:  Patient presents today status post third metatarsal head resection of removal of hardware right foot.. DOS: 02/03/2018. She states she is doing well. She reports hiking 6 miles yesterday without issue. She denies any modifying factors. She has been using the compression anklet as directed. Patient is here for further evaluation and treatment.   Past Medical History:  Diagnosis Date  . Chicken pox   . Depression   . Esophagitis 12/24/2011  . Frequent headaches   . GERD (gastroesophageal reflux disease)   . Hemorrhage of rectum and anus 12/16/2011  . Migraines   . Migraines   . Nausea with vomiting, chronic - followed by Dr. Deatra Ina and GI 12/16/2011  . Seasonal allergies   . UTI (urinary tract infection)       Objective/Physical Exam Neurovascular status intact.  Skin incisions appear to be healed. No sign of infectious process noted. No dehiscence. No active bleeding noted. Moderate edema noted to the surgical extremity.   Assessment: 1. s/p third metatarsal head resection with removal of hardware right foot. DOS: 02/03/2018 2.  Symptomatic hematoma to surgical incision site right foot - resolved   Plan of Care:  1. Patient was evaluated. 2. May resume full activity with no restrictions.  3. Recommended good shoe gear.  4. Return to clinic as needed.    Edrick Kins, DPM [facility]  Dr. Edrick Kins, [address]                                        [place], [place] [address]                Office [phone]  Fax [phone]

## 2018-05-23 ENCOUNTER — Ambulatory Visit: Payer: 59 | Admitting: Obstetrics & Gynecology

## 2018-06-06 ENCOUNTER — Ambulatory Visit (INDEPENDENT_AMBULATORY_CARE_PROVIDER_SITE_OTHER): Payer: 59 | Admitting: Allergy

## 2018-06-06 ENCOUNTER — Encounter: Payer: Self-pay | Admitting: Allergy

## 2018-06-06 ENCOUNTER — Other Ambulatory Visit: Payer: Self-pay

## 2018-06-06 VITALS — BP 90/50 | HR 97 | Temp 98.2°F | Resp 16 | Ht 66.14 in | Wt 223.0 lb

## 2018-06-06 DIAGNOSIS — T781XXA Other adverse food reactions, not elsewhere classified, initial encounter: Secondary | ICD-10-CM | POA: Insufficient documentation

## 2018-06-06 DIAGNOSIS — J3089 Other allergic rhinitis: Secondary | ICD-10-CM

## 2018-06-06 DIAGNOSIS — T781XXD Other adverse food reactions, not elsewhere classified, subsequent encounter: Secondary | ICD-10-CM

## 2018-06-06 DIAGNOSIS — L659 Nonscarring hair loss, unspecified: Secondary | ICD-10-CM

## 2018-06-06 DIAGNOSIS — Z87898 Personal history of other specified conditions: Secondary | ICD-10-CM

## 2018-06-06 DIAGNOSIS — Z8709 Personal history of other diseases of the respiratory system: Secondary | ICD-10-CM | POA: Insufficient documentation

## 2018-06-06 HISTORY — DX: Personal history of other specified conditions: Z87.898

## 2018-06-06 NOTE — Assessment & Plan Note (Signed)
Loss of consciousness after shellfish ingestion at age 38.  Continue strict avoidance of shellfish.  The patients history suggests shellfish allergy, though todays skin tests were negative despite a positive histamine control.  Food allergen skin testing has excellent negative predictive value however there is still a 5% chance that the allergy exists. Therefore, we will investigate further with serum specific IgE levels and, if negative then schedule for open graded oral food challenge. A laboratory order form has been provided for serum specific IgE against shellfish. Until the food allergy has been definitively ruled out, the patient is to continue meticulous avoidance of shellfish and have access to epinephrine autoinjector 2 pack.  For mild symptoms you can take over the counter antihistamines such as Benadryl and monitor symptoms closely. If symptoms worsen or if you have severe symptoms including breathing issues, throat closure, significant swelling, whole body hives, severe diarrhea and vomiting, lightheadedness then seek immediate medical care.  Food action plan given.

## 2018-06-06 NOTE — Patient Instructions (Addendum)
Today's skin testing showed: Positive to tree pollen only.   Get bloodwork - IgG, IgA, IgM, s pneumo titers, tetanus and diphtheria titers.  Keep track of infections. If having frequent ear infections may be worthwhile to see ENT.  Follow up with dermatology as scheduled. I do not think these environmental allergies or food allergies are causing your hair loss. Continue iron pills, testosterone cream and vitamin D as per your PCP.   Allergic rhinitis:  Start environmental control measures.  May use over the counter antihistamines such as Zyrtec (cetirizine), Claritin (loratadine), Allegra (fexofenadine), or Xyzal (levocetirizine) daily as needed.  Breathing:  May use albuterol rescue inhaler 2 puffs or nebulizer every 4 to 6 hours as needed for shortness of breath, chest tightness, coughing, and wheezing. May use albuterol rescue inhaler 2 puffs 5 to 15 minutes prior to strenuous physical activities. Monitor frequency of use.   Food allergy:  Continue strict avoidance of shellfish.  The patients history suggests shellfish allergy, though todays skin tests were negative despite a positive histamine control.  Food allergen skin testing has excellent negative predictive value however there is still a 5% chance that the allergy exists. Therefore, we will investigate further with serum specific IgE levels and, if negative then schedule for open graded oral food challenge. A laboratory order form has been provided for serum specific IgE against shellfish. Until the food allergy has been definitively ruled out, the patient is to continue meticulous avoidance of shellfish and have access to epinephrine autoinjector 2 pack.  For mild symptoms you can take over the counter antihistamines such as Benadryl and monitor symptoms closely. If symptoms worsen or if you have severe symptoms including breathing issues, throat closure, significant swelling, whole body hives, severe diarrhea and vomiting,  lightheadedness then seek immediate medical care.  Food action plan given.   Follow up in 6 months  Reducing Pollen Exposure . Pollen seasons: trees (spring), grass (summer) and ragweed/weeds (fall). Marland Kitchen Keep windows closed in your home and car to lower pollen exposure.  Susa Simmonds air conditioning in the bedroom and throughout the house if possible.  . Avoid going out in dry windy days - especially early morning. . Pollen counts are highest between 5 - 10 AM and on dry, hot and windy days.  . Save outside activities for late afternoon or after a heavy rain, when pollen levels are lower.  . Avoid mowing of grass if you have grass pollen allergy. Marland Kitchen Be aware that pollen can also be transported indoors on people and pets.  . Dry your clothes in an automatic dryer rather than hanging them outside where they might collect pollen.  . Rinse hair and eyes before bedtime.  Skin care recommendations  Bath time: . Always use lukewarm water. AVOID very hot or cold water. Marland Kitchen Keep bathing time to 5-10 minutes. . Do NOT use bubble bath. . Use a mild soap and use just enough to wash the dirty areas. . Do NOT scrub skin vigorously.  . After bathing, pat dry your skin with a towel. Do NOT rub or scrub the skin.  Moisturizers and prescriptions:  . ALWAYS apply moisturizers immediately after bathing (within 3 minutes). This helps to lock-in moisture. . Use the moisturizer several times a day over the whole body. Kermit Balo summer moisturizers include: Aveeno, CeraVe, Cetaphil. Kermit Balo winter moisturizers include: Aquaphor, Vaseline, Cerave, Cetaphil, Eucerin, Vanicream. . When using moisturizers along with medications, the moisturizer should be applied about one hour after applying  the medication to prevent diluting effect of the medication or moisturize around where you applied the medications. When not using medications, the moisturizer can be continued twice daily as maintenance.  Laundry and  clothing: . Avoid laundry products with added color or perfumes. . Use unscented hypo-allergenic laundry products such as Tide free, Cheer free & gentle, and All free and clear.  . If the skin still seems dry or sensitive, you can try double-rinsing the clothes. . Avoid tight or scratchy clothing such as wool. . Do not use fabric softeners or dyer sheets.

## 2018-06-06 NOTE — Assessment & Plan Note (Signed)
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 Using zyrtec with good benefit. Intolerant of nasal sprays.  Today's skin testing was only positive to tree pollen.  Start environmental control measures.  May use over the counter antihistamines such as Zyrtec (cetirizine), Claritin (loratadine), Allegra (fexofenadine), or Xyzal (levocetirizine) daily as needed.

## 2018-06-06 NOTE — Assessment & Plan Note (Addendum)
Frequent URIs but only required 2 courses of antibiotics the last 12 months.  Keep track of infections. If having frequent ear infections may be worthwhile to see ENT.  Get basic immune evaluation bloodwork.

## 2018-06-06 NOTE — Progress Notes (Signed)
New Patient Note  RE: Betty Leonard MRN: [id number] DOB: December 03, 1980 Date of Office Visit: 06/06/2018  Referring provider: Armanda Heritage, NP Primary care provider: Julian Hy, PA-C  Chief Complaint: Allergy Testing; Hair/Scalp Problem (losing her hair,); Joint Pain; and Headache  History of Present Illness: I had the pleasure of seeing Betty Leonard for initial evaluation at the [facility] on 06/06/2018. She is a 38 y.o. female, who is referred here by Julian Hy, PA-C for the evaluation of ? environmental allergies.   She has been having issues with hair loss for the last 2 months but it has improved slightly. Around this time also developed headaches and joint pains in the elbows and the hips. The joint pains occur at least once a day.  Concerned if there are any allergy triggers. She is on iron supplements for iron deficiency. She is scheduled to see dermatology later this month. Denies any changes in personal care products or diet.  Patient had bloodwork in April 2020 - low Iron, Vitamin D and testosterone. Normal CBC, CMP, TSH, B12 - asked patient to send results to mychart.   Rhinitis: She reports symptoms of sneezing, watery eyes, nasal congestion, rhinorrhea. Symptoms have been going on for 8 years. The symptoms are present during the spring. Other triggers include exposure to dust. Anosmia: no. Headache: yes. She has used zyrtec with fair improvement in symptoms. Claritin stopped working. Patient intolerant of nasal sprays as it causes gag issues. Sinus infections: one. Previous work up includes: skin testing in 2015 and not sure what it was positive to - records not available for review.  Previous ENT evaluation: not recently. Previous sinus imaging: no.  Assessment and Plan: [name] is a 38 y.o. 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 Using zyrtec with good benefit.  Intolerant of nasal sprays.  Today's skin testing was only positive to tree pollen.  Start environmental control measures.  May use over the counter antihistamines such as Zyrtec (cetirizine), Claritin (loratadine), Allegra (fexofenadine), or Xyzal (levocetirizine) daily as needed.  Hair loss 2 month history of hair loss which is improving. She was found to be iron deficient, vitamin D deficient and having low testosterone.  Discussed with patient that it's very unlikely that food/environmental allergies cause hair loss.   Follow up with dermatology as scheduled.  Continue iron pills, testosterone cream and vitamin D as per your PCP.   History of wheezing History of wheezing with URIs requiring albuterol at times.  Today's spirometry showed: No overt abnormalities noted given today's efforts.   May use albuterol rescue inhaler 2 puffs or nebulizer every 4 to 6 hours as needed for shortness of breath, chest tightness, coughing, and wheezing. May use albuterol rescue inhaler 2 puffs 5 to 15 minutes prior to strenuous physical activities. Monitor frequency of use.   Adverse food reaction Loss of consciousness after shellfish ingestion at age 29.  Continue strict avoidance of shellfish.  The patients history suggests shellfish allergy, though todays skin tests were negative despite a positive histamine control.  Food allergen skin testing has excellent negative predictive value however there is still a 5% chance that the allergy exists. Therefore, we will investigate further with serum specific IgE levels and, if negative then schedule for open graded oral food challenge. A laboratory order form has been provided for serum specific IgE against shellfish. Until the food allergy has been definitively ruled out, the patient  is to continue meticulous avoidance of shellfish and have access to epinephrine autoinjector 2 pack.  For mild symptoms you can take over the counter antihistamines  such as Benadryl and monitor symptoms closely. If symptoms worsen or if you have severe symptoms including breathing issues, throat closure, significant swelling, whole body hives, severe diarrhea and vomiting, lightheadedness then seek immediate medical care.  Food action plan given.   History of frequent URI Frequent URIs but only required 2 courses of antibiotics the last 12 months.  Keep track of infections. If having frequent ear infections may be worthwhile to see ENT.  Get basic immune evaluation bloodwork.   Return in about 6 months (around 12/07/2018).  Lab Orders     IgG, IgA, IgM     Strep pneumoniae 23 Serotypes IgG     Diphtheria / Tetanus Antibody Panel     Allergen Profile, Shellfish  Other allergy screening: Asthma: no  Only used albuterol during URI with good benefit. Otherwise denies any respiratory issues.  Rhino conjunctivitis: yes Food allergy: yes  Shellfish caused passing out episode at age 39.  Patient has Epipen and did not have to use it.  Dietary History: patient has been eating other foods including milk, eggs, peanut, treenuts, sesame, seafood, soy, wheat, meats, fruits and vegetables.  She reports reading labels and avoiding shellfish in diet completely.  Apparently she was asked a food challenge by previous allergist which she has not done.   Medication allergy: yes  Codeine - SOB Aspirin - hives at age 11 Hymenoptera allergy: no Urticaria: no Eczema:no History of recurrent infections suggestive of immunodeficency:   Patient has history multiple infections including sinus infection, pneumonia - once at age 30, ear infections 4-5 per year, GI infections/diarrhea, skin infections/abscesses. Patient has no history of opportunistic infections including fungal infections, viral infections.   Patient reports 2 antibiotic use in the last 12 months and 0 hospital admissions. Patient does not have any secondary causes of immunodeficiency including chronic  steroid use, diabetes mellitus, protein losing enteropathy, renal or hepatic dysfunction, history of cancer or irradiation or history of HIV, hepatitis B or C.  Diagnostics: Spirometry:  Tracings reviewed. Her effort: It was hard to get consistent efforts and there is a question as to whether this reflects a maximal maneuver. FVC: 3.86L FEV1: 2.80L, 85% predicted FEV1/FVC ratio: 73% Interpretation: No overt abnormalities noted given today's efforts.  Please see scanned spirometry results for details.  Skin Testing: Environmental allergy panel and select foods. Positive test to: tree pollen. Negative test to: foods including shellfish.  Results discussed with patient/family. Airborne Adult Perc - 06/06/18 0958    Time Antigen Placed  6160    Allergen Manufacturer  Lavella Hammock    Number of Test  59    1. Control-Buffer 50% Glycerol  Negative    2. Control-Histamine 1 mg/ml  2+    3. Albumin saline  Negative    4. Savanna  Negative    5. [place]  Negative    6. Johnson  Negative    7. Hawthorne Blue  Negative    8. Meadow Fescue  Negative    9. Perennial Rye  Negative    10. Sweet Vernal  Negative    11. Timothy  Negative    12. Cocklebur  Negative    13. Burweed Marshelder  Negative    14. Ragweed, short  Negative    15. Ragweed, Giant  Negative    16. Plantain,  English  Negative  17. Lamb's Quarters  Negative    18. Sheep Sorrell  Negative    19. Rough Pigweed  Negative    20. Marsh Elder, Rough  Negative    21. Mugwort, Common  Negative    22. Ash mix  Negative    23. Birch mix  Negative    24. Beech American  Negative    25. Box, Elder  Negative    26. Cedar, red  Negative    27. Cottonwood, Eastern  2+    28. Elm mix  Negative    29. Hickory mix  Negative    30. Maple mix  Negative    31. Oak, [place] mix  Negative    32. Pecan Pollen  Negative    33. Pine mix  Negative    34. Sycamore Eastern  Negative    35. Summerside, Black Pollen  Negative    36. Alternaria alternata   Negative    37. Cladosporium Herbarum  Negative    38. Aspergillus mix  Negative    39. Penicillium mix  Negative    40. Bipolaris sorokiniana (Helminthosporium)  Negative    41. Drechslera spicifera (Curvularia)  Negative    42. Mucor plumbeus  Negative    43. Fusarium moniliforme  Negative    44. Aureobasidium pullulans (pullulara)  Negative    45. Rhizopus oryzae  Negative    46. Botrytis cinera  Negative    47. Epicoccum nigrum  Negative    48. Phoma betae  Negative    49. Candida Albicans  Negative    50. Trichophyton mentagrophytes  Negative    51. Mite, D Farinae  5,000 AU/ml  Negative    52. Mite, D Pteronyssinus  5,000 AU/ml  Negative    53. Cat Hair 10,000 BAU/ml  Negative    54.  Dog Epithelia  Negative    55. Mixed Feathers  Negative    56. Horse Epithelia  Negative    57. Cockroach, German  Negative    58. Mouse  Negative    59. Tobacco Leaf  Negative     Food Perc - 06/06/18 0959    Time Antigen Placed  5956    Allergen Manufacturer  Lavella Hammock    Location  Back    Number of allergen test  10    Food  Select    1. Peanut  Negative    2. Soybean food  Negative    3. Wheat, whole  Negative    4. Sesame  Negative    5. Milk, cow  Negative    6. Egg White, chicken  Negative    7. Casein  Negative    8. Shellfish mix  Negative    9. Fish mix  Negative    10. Cashew  Negative     Food Adult Perc - 06/06/18 1000    Time Antigen Placed  1000    Allergen Manufacturer  Greer    Location  Back    Number of allergen test  5    25. Shrimp  Negative    26. Crab  Negative    27. Lobster  Negative    28. Oyster  Negative    29. Scallops  Negative       Past Medical History: Patient Active Problem List   Diagnosis Date Noted  . History of wheezing 06/06/2018  . Adverse food reaction 06/06/2018  . Other allergic rhinitis 06/06/2018  . Hair loss 06/06/2018  . History of  frequent URI 06/06/2018  . Anxiety and depression 09/23/2012  . Hx of migraine headaches  09/23/2012  . Tobacco use disorder 09/23/2012  . Nausea with vomiting, chronic - followed by Dr. Deatra Ina and GI 12/16/2011   Past Medical History:  Diagnosis Date  . Chicken pox   . Depression   . Esophagitis 12/24/2011  . Frequent headaches   . GERD (gastroesophageal reflux disease)   . Hemorrhage of rectum and anus 12/16/2011  . Migraines   . Migraines   . Nausea with vomiting, chronic - followed by Dr. Deatra Ina and GI 12/16/2011  . Seasonal allergies   . UTI (urinary tract infection)    Past Surgical History: Past Surgical History:  Procedure Laterality Date  . ESOPHAGOGASTRODUODENOSCOPY  12/24/2011   Procedure: ESOPHAGOGASTRODUODENOSCOPY (EGD);  Surgeon: Inda Castle, MD;  Location: [facility];  Service: Endoscopy;  Laterality: N/A;  . FOOT SURGERY Right    repair remotely after a fracture  . KNEE ARTHROSCOPY Left 2018   Medication List:  Current Outpatient Medications  Medication Sig Dispense Refill  . cetirizine (ZYRTEC) 10 MG tablet Take 1 tablet (10 mg total) by mouth daily. 30 tablet 11  . EPINEPHrine 0.3 mg/0.3 mL IJ SOAJ injection Inject 0.3 mg into the muscle once.    Marland Kitchen ibuprofen (ADVIL,MOTRIN) 800 MG tablet Take 1 tablet (800 mg total) by mouth 3 (three) times daily as needed. 60 tablet 1  . Vitamin D, Ergocalciferol, (DRISDOL) 1.25 MG (50000 UT) CAPS capsule     . baclofen (LIORESAL) 10 MG tablet Take 2 tablets by mouth daily as needed.    . ferrous sulfate 325 (65 FE) MG EC tablet      No current facility-administered medications for this visit.    Allergies: Allergies  Allergen Reactions  . Codeine Shortness Of Breath  . Shellfish Allergy Anaphylaxis  . Aspirin Hives  . Iodine     Shellfish allergy   Social History: Social History   Socioeconomic History  . Marital status: Single    Spouse name: Not on file  . Number of children: Not on file  . Years of education: Not on file  . Highest education level: Not on file  Occupational History  . Not  on file  Social Needs  . Financial resource strain: Not on file  . Food insecurity:    Worry: Not on file    Inability: Not on file  . Transportation needs:    Medical: Not on file    Non-medical: Not on file  Tobacco Use  . Smoking status: Former Smoker    Packs/day: 0.50    Years: 5.00    Pack years: 2.50    Types: Cigarettes  . Smokeless tobacco: Never Used  Substance and Sexual Activity  . Alcohol use: Yes    Alcohol/week: 0.0 standard drinks    Comment: occ  . Drug use: No  . Sexual activity: Not Currently    Birth control/protection: None  Lifestyle  . Physical activity:    Days per week: Not on file    Minutes per session: Not on file  . Stress: Not on file  Relationships  . Social connections:    Talks on phone: Not on file    Gets together: Not on file    Attends religious service: Not on file    Active member of club or organization: Not on file    Attends meetings of clubs or organizations: Not on file    Relationship status: Not on  file  Other Topics Concern  . Not on file  Social History Narrative   Work or Database administrator, [employer]      Home Situation: lives with roomate      Spiritual Beliefs: Christian      Lifestyle: CV exercise (67minutes) 3 days per week; diet is poor               Lives in a apartment. Smoking: 1/2 pack per day x 15 yrs Occupation: Emergency planning/management officer History: Water Damage/mildew in the house: no Charity fundraiser in the family room: yes Carpet in the bedroom: yes Heating: electric Cooling: central Pet: yes 1 dog x 8 yrs  Family History: Family History  Problem Relation Age of Onset  . Leukemia Paternal Grandfather   . Lung cancer Paternal Grandfather   . Arthritis Paternal Grandfather   . Diabetes Paternal Grandfather   . Heart disease Paternal Grandfather   . Hypertension Paternal Grandfather   . Kidney disease Paternal Grandfather   . Prostate cancer Paternal Grandfather   . Gaucher's disease  Paternal Grandfather   . Sudden death Father        age 67, nothing found on autopsy  . Diabetes Father   . Hyperlipidemia Father   . Hypertension Father   . Mental illness Mother        bipolar, schizophrenia  . Alcohol abuse Maternal Grandfather   . Stroke Paternal Grandmother   . Colon cancer Neg Hx    Problem                               Relation Asthma                                   No  Eczema                                No  Food allergy                          No  Allergic rhino conjunctivitis     Brother   Review of Systems  Constitutional: Negative for appetite change, chills, fever and unexpected weight change.  HENT: Positive for rhinorrhea and sneezing. Negative for congestion.   Eyes: Positive for itching.  Respiratory: Negative for cough, chest tightness, shortness of breath and wheezing.   Cardiovascular: Negative for chest pain.  Gastrointestinal: Positive for nausea. Negative for abdominal pain.  Genitourinary: Negative for difficulty urinating.  Skin: Positive for rash.  Allergic/Immunologic: Positive for environmental allergies and food allergies.  Neurological: Positive for headaches.   Objective: BP (!) 90/50 (BP Location: Left Arm, Patient Position: Sitting, Cuff Size: Large)   Pulse 97   Temp 98.2 F (36.8 C) (Tympanic)   Resp 16   Ht 5' 6.14" (1.68 m)   Wt 223 lb (101.2 kg)   SpO2 98%   BMI 35.84 kg/m  Body mass index is 35.84 kg/m. Physical Exam  Constitutional: She is oriented to person, place, and time. She appears well-developed and well-nourished.  HENT:  Head: Normocephalic and atraumatic.  Right Ear: External ear normal.  Left Ear: External ear normal.  Nose: Nose normal.  Mouth/Throat: Oropharynx is clear and moist.  Eyes: Conjunctivae and EOM are normal.  Neck: Neck supple.  Cardiovascular: Normal rate, regular rhythm and normal heart sounds. Exam reveals no gallop and no friction rub.  No murmur heard. Pulmonary/Chest:  Effort normal and breath sounds normal. She has no wheezes. She has no rales.  Abdominal: Soft. There is no abdominal tenderness.  Neurological: She is alert and oriented to person, place, and time.  Skin: Skin is warm. No rash noted.  Areas of sparse hair on the top of the skull but no balding spots noted.   Psychiatric: She has a normal mood and affect. Her behavior is normal.  Nursing note and vitals reviewed.  The plan was reviewed with the patient/family, and all questions/concerned were addressed.  It was my pleasure to see Yareli today and participate in her care. Please feel free to contact me with any questions or concerns.  Sincerely,  Rexene Alberts, DO Allergy & Immunology  [facility] [address] office: [phone] [facility] office: [phone]

## 2018-06-06 NOTE — Assessment & Plan Note (Signed)
History of wheezing with URIs requiring albuterol at times.  Today's spirometry showed: No overt abnormalities noted given today's efforts.   May use albuterol rescue inhaler 2 puffs or nebulizer every 4 to 6 hours as needed for shortness of breath, chest tightness, coughing, and wheezing. May use albuterol rescue inhaler 2 puffs 5 to 15 minutes prior to strenuous physical activities. Monitor frequency of use.

## 2018-06-06 NOTE — Assessment & Plan Note (Signed)
2 month history of hair loss which is improving. She was found to be iron deficient, vitamin D deficient and having low testosterone.  Discussed with patient that it's very unlikely that food/environmental allergies cause hair loss.   Follow up with dermatology as scheduled.  Continue iron pills, testosterone cream and vitamin D as per your PCP.

## 2018-06-10 LAB — STREP PNEUMONIAE 23 SEROTYPES IGG
Pneumo Ab Type 1*: 3.8 ug/mL (ref >1.3)
Pneumo Ab Type 12 (12F)*: 1.4 ug/mL (ref >1.3)
Pneumo Ab Type 14*: 6.2 ug/mL (ref >1.3)
Pneumo Ab Type 17 (17F)*: 2.7 ug/mL (ref >1.3)
Pneumo Ab Type 19 (19F)*: 9.2 ug/mL (ref >1.3)
Pneumo Ab Type 2*: 1.5 ug/mL (ref >1.3)
Pneumo Ab Type 20*: 5.8 ug/mL (ref >1.3)
Pneumo Ab Type 22 (22F)*: 9.4 ug/mL (ref >1.3)
Pneumo Ab Type 23 (23F)*: 7.2 ug/mL (ref >1.3)
Pneumo Ab Type 26 (6B)*: 6.4 ug/mL (ref >1.3)
Pneumo Ab Type 3*: 22.9 ug/mL (ref >1.3)
Pneumo Ab Type 34 (10A)*: 2.1 ug/mL (ref >1.3)
Pneumo Ab Type 4*: 1.6 ug/mL (ref >1.3)
Pneumo Ab Type 43 (11A)*: 5 ug/mL (ref >1.3)
Pneumo Ab Type 5*: 1.6 ug/mL (ref >1.3)
Pneumo Ab Type 51 (7F)*: 11.7 ug/mL (ref >1.3)
Pneumo Ab Type 54 (15B)*: 3.8 ug/mL (ref >1.3)
Pneumo Ab Type 56 (18C)*: 12.2 ug/mL (ref >1.3)
Pneumo Ab Type 57 (19A)*: 16.8 ug/mL (ref >1.3)
Pneumo Ab Type 68 (9V)*: 1.9 ug/mL (ref >1.3)
Pneumo Ab Type 70 (33F)*: 2.5 ug/mL (ref >1.3)
Pneumo Ab Type 8*: 5.2 ug/mL (ref >1.3)
Pneumo Ab Type 9 (9N)*: 1.2 ug/mL — ABNORMAL LOW (ref >1.3)

## 2018-06-10 LAB — ALLERGEN PROFILE, SHELLFISH
Clam IgE: <0.1 kU/L
F023-IgE Crab: <0.1 kU/L
F080-IgE Lobster: <0.1 kU/L
F290-IgE Oyster: <0.1 kU/L
Scallop IgE: <0.1 kU/L
Shrimp IgE: <0.1 kU/L

## 2018-06-10 LAB — IGG, IGA, IGM
IgA/Immunoglobulin A, Serum: 174 mg/dL (ref 87–352)
IgG (Immunoglobin G), Serum: 1067 mg/dL (ref 586–1602)
IgM (Immunoglobulin M), Srm: 244 mg/dL — ABNORMAL HIGH (ref 26–217)

## 2018-06-10 LAB — DIPHTHERIA / TETANUS ANTIBODY PANEL
Diphtheria Ab: <0.1 IU/mL — ABNORMAL LOW (ref <0.10)
Tetanus Ab, IgG: 2.51 IU/mL (ref <0.10)

## 2018-06-15 ENCOUNTER — Encounter: Payer: Self-pay | Admitting: Allergy

## 2018-06-17 NOTE — Telephone Encounter (Signed)
Patient called back and spoke with her. She is frustrated about her hair loss and joint pains and not being able to find an answer.  Discussed with her that allergies usually do not cause this.  Her basic immune evaluation was relatively unremarkable and I doubt she has any type of immunodeficiency causing her issues.  I do think she would benefit from a rheumatology evaluation next and following up with her PCP as well. I also recommended that she gets the Tdap booster and we can recheck titers 4-6 weeks after vaccine to make sure she responded appropriately to the vaccine but this would not explain her main concerns of hair loss and joint pains.

## 2018-06-17 NOTE — Telephone Encounter (Signed)
Tried to call patient. No answer. Left VM to call our office otherwise I will try to call her again on Monday to review lab results with her.

## 2018-07-20 DIAGNOSIS — J189 Pneumonia, unspecified organism: Secondary | ICD-10-CM

## 2018-07-20 HISTORY — DX: Pneumonia, unspecified organism: J18.9

## 2018-08-03 ENCOUNTER — Telehealth: Payer: 59 | Admitting: Family

## 2018-08-03 DIAGNOSIS — Z20828 Contact with and (suspected) exposure to other viral communicable diseases: Secondary | ICD-10-CM

## 2018-08-03 DIAGNOSIS — Z20822 Contact with and (suspected) exposure to covid-19: Secondary | ICD-10-CM

## 2018-08-03 MED ORDER — BENZONATATE 100 MG PO CAPS: 100.0000 mg | ORAL_CAPSULE | Freq: Three times a day (TID) | ORAL | 0 refills | Status: DC | PRN

## 2018-08-03 NOTE — Progress Notes (Signed)
E-Visit for Corona Virus Screening   Your current symptoms could be consistent with the coronavirus.  I have sent your note to our Community Testing site. They should reach out to you in the next 24 hours to set up a time for testing if you qualify.  Approximately 5 minutes was spent documenting and reviewing patient's chart.    COVID-19 is a respiratory illness with symptoms that are similar to the flu. Symptoms are typically mild to moderate, but there have been cases of severe illness and death due to the virus. The following symptoms may appear 2-14 days after exposure: . Fever . Cough . Shortness of breath or difficulty breathing . Chills . Repeated shaking with chills . Muscle pain . Headache . Sore throat . New loss of taste or smell . Fatigue . Congestion or runny nose . Nausea or vomiting . Diarrhea  It is vitally important that if you feel that you have an infection such as this virus or any other virus that you stay home and away from places where you may spread it to others.  You should self-quarantine for 14 days if you have symptoms that could potentially be coronavirus or have been in close contact a with a person diagnosed with COVID-19 within the last 2 weeks. You should avoid contact with people age 14 and older.   You should wear a mask or cloth face covering over your nose and mouth if you must be around other people or animals, including pets (even at home). Try to stay at least 6 feet away from other people. This will protect the people around you.  You can use medication such as A prescription cough medication called Tessalon Perles 100 mg. You may take 1-2 capsules every 8 hours as needed for cough  You may also take acetaminophen (Tylenol) as needed for fever.   Reduce your risk of any infection by using the same precautions used for avoiding the common cold or flu:  Marland Kitchen Wash your hands often with soap and warm water for at least 20 seconds.  If soap and water  are not readily available, use an alcohol-based hand sanitizer with at least 60% alcohol.  . If coughing or sneezing, cover your mouth and nose by coughing or sneezing into the elbow areas of your shirt or coat, into a tissue or into your sleeve (not your hands). . Avoid shaking hands with others and consider head nods or verbal greetings only. . Avoid touching your eyes, nose, or mouth with unwashed hands.  . Avoid close contact with people who are sick. . Avoid places or events with large numbers of people in one location, like concerts or sporting events. . Carefully consider travel plans you have or are making. . If you are planning any travel outside or inside the [place], visit the CDC's Travelers' Health webpage for the latest health notices. . If you have some symptoms but not all symptoms, continue to monitor at home and seek medical attention if your symptoms worsen. . If you are having a medical emergency, call 911.  HOME CARE . Only take medications as instructed by your medical team. . Drink plenty of fluids and get plenty of rest. . A steam or ultrasonic humidifier can help if you have congestion.   GET HELP RIGHT AWAY IF YOU HAVE EMERGENCY WARNING SIGNS** FOR COVID-19. If you or someone is showing any of these signs seek emergency medical care immediately. Call 911 or proceed to your closest emergency  facility if: . You develop worsening high fever. . Trouble breathing . Bluish lips or face . Persistent pain or pressure in the chest . New confusion . Inability to wake or stay awake . You cough up blood. . Your symptoms become more severe  **This list is not all possible symptoms. Contact your medical provider for any symptoms that are sever or concerning to you.   MAKE SURE YOU   Understand these instructions.  Will watch your condition.  Will get help right away if you are not doing well or get worse.  Your e-visit answers were reviewed by a board certified advanced  clinical practitioner to complete your personal care plan.  Depending on the condition, your plan could have included both over the counter or prescription medications.  If there is a problem please reply once you have received a response from your provider.  Your safety is important to [place].  If you have drug allergies check your prescription carefully.    You can use MyChart to ask questions about today's visit, request a non-urgent call back, or ask for a work or school excuse for 24 hours related to this e-Visit. If it has been greater than 24 hours you will need to follow up with your provider, or enter a new e-Visit to address those concerns. You will get an e-mail in the next two days asking about your experience.  I hope that your e-visit has been valuable and will speed your recovery. Thank you for using e-visits.

## 2018-09-05 ENCOUNTER — Ambulatory Visit: Payer: 59 | Admitting: Obstetrics & Gynecology

## 2018-09-15 ENCOUNTER — Other Ambulatory Visit: Payer: Self-pay

## 2018-09-19 ENCOUNTER — Other Ambulatory Visit (HOSPITAL_COMMUNITY)
Admission: RE | Admit: 2018-09-19 | Discharge: 2018-09-19 | Disposition: A | Discharge status: Home / Self Care | Payer: 59 | Source: Ambulatory Visit | Attending: Obstetrics & Gynecology | Admitting: Obstetrics & Gynecology

## 2018-09-19 ENCOUNTER — Ambulatory Visit (INDEPENDENT_AMBULATORY_CARE_PROVIDER_SITE_OTHER): Payer: 59 | Admitting: Obstetrics & Gynecology

## 2018-09-19 ENCOUNTER — Other Ambulatory Visit: Payer: Self-pay

## 2018-09-19 ENCOUNTER — Encounter: Payer: Self-pay | Admitting: Obstetrics & Gynecology

## 2018-09-19 VITALS — BP 110/64 | Temp 97.4°F | Ht 66.0 in | Wt 222.6 lb

## 2018-09-19 DIAGNOSIS — R87612 Low grade squamous intraepithelial lesion on cytologic smear of cervix (LGSIL): Secondary | ICD-10-CM | POA: Insufficient documentation

## 2018-09-19 DIAGNOSIS — L659 Nonscarring hair loss, unspecified: Secondary | ICD-10-CM

## 2018-09-19 DIAGNOSIS — N920 Excessive and frequent menstruation with regular cycle: Secondary | ICD-10-CM | POA: Diagnosis not present

## 2018-09-19 DIAGNOSIS — Z01419 Encounter for gynecological examination (general) (routine) without abnormal findings: Secondary | ICD-10-CM

## 2018-09-19 DIAGNOSIS — Z124 Encounter for screening for malignant neoplasm of cervix: Secondary | ICD-10-CM

## 2018-09-19 NOTE — Progress Notes (Signed)
38 y.o. G0P0000 Single White or Caucasian female here for annual exam.  Cycles are heavy.  Cycles are not regular.  Flow can last for a day or two but last 5-6 days.  Flow is very heavy the first two days.  She cramps and passes clots.      She had surgery on her foot in January.  She had to have screws removed.     Had episode of hair loss in January.  Provider is at [facility] .  Testosterone was low.  Saw a dermatologist and started on topical steroid.    Just had iron levels done in July.  Iron level was 36.  Ferritin level was 16.  She had these done at [facility].    Vit D was 10 at last check.  She is on 50K weekly.  Has follow up scheduled in October.    Patient's last menstrual period was 09/11/2018 (approximate).          Sexually active: No.  The current method of family planning is abstinence.    Exercising: No.  The patient does not participate in regular exercise at present. Smoker:  Yes, 1ppd  Health Maintenance: Pap: 05-06-17 LSIL:Pos HR HPV History of abnormal Pap:  Yes, 05-06-17 LSIL:Pos HR HPV;colpo CIN 1 MMG:  n/a Colonoscopy: n/a BMD:   n/a TDaP: 2013 Pneumonia vaccine(s):  2017 Shingrix:   n/a Hep C testing: no Screening Labs: no   reports that she has been smoking cigarettes. She has a 5.00 pack-year smoking history. She has never used smokeless tobacco. She reports current alcohol use of about 2.0 standard drinks of alcohol per week. She reports that she does not use drugs.  Past Medical History:  Diagnosis Date  . Chicken pox   . Depression   . Esophagitis 12/24/2011  . Frequent headaches   . GERD (gastroesophageal reflux disease)   . Hemorrhage of rectum and anus 12/16/2011  . Migraines   . Migraines   . Nausea with vomiting, chronic - followed by Dr. Deatra Ina and GI 12/16/2011  . Seasonal allergies   . UTI (urinary tract infection)     Past Surgical History:  Procedure Laterality Date  . ESOPHAGOGASTRODUODENOSCOPY  12/24/2011   Procedure:  ESOPHAGOGASTRODUODENOSCOPY (EGD);  Surgeon: Inda Castle, MD;  Location: [facility];  Service: Endoscopy;  Laterality: N/A;  . FOOT SURGERY Right    repair remotely after a fracture  . FOOT SURGERY Right 1'2020  . KNEE ARTHROSCOPY Left 2018    Current Outpatient Medications  Medication Sig Dispense Refill  . cetirizine (ZYRTEC) 10 MG tablet Take 1 tablet (10 mg total) by mouth daily. 30 tablet 11  . EPINEPHrine 0.3 mg/0.3 mL IJ SOAJ injection Inject 0.3 mg into the muscle once.    . ferrous sulfate 325 (65 FE) MG tablet Take 325 mg by mouth daily with breakfast.    . ondansetron (ZOFRAN) 8 MG tablet Take 8 mg by mouth daily.    . Vitamin D, Ergocalciferol, (DRISDOL) 1.25 MG (50000 UT) CAPS capsule      No current facility-administered medications for this visit.     Family History  Problem Relation Age of Onset  . Leukemia Paternal Grandfather   . Lung cancer Paternal Grandfather   . Arthritis Paternal Grandfather   . Diabetes Paternal Grandfather   . Heart disease Paternal Grandfather   . Hypertension Paternal Grandfather   . Kidney disease Paternal Grandfather   . Prostate cancer Paternal Grandfather   .  Gaucher's disease Paternal Grandfather   . Sudden death Father        age 54, nothing found on autopsy  . Diabetes Father   . Hyperlipidemia Father   . Hypertension Father   . Mental illness Mother        bipolar, schizophrenia  . Alcohol abuse Maternal Grandfather   . Stroke Paternal Grandmother   . Colon cancer Neg Hx     Review of Systems  All other systems reviewed and are negative.   Exam:   BP 110/64 (Cuff Size: Large)   Temp (!) 97.4 F (36.3 C) (Temporal)   Ht 5\' 6"  (1.676 m)   Wt 222 lb 9.6 oz (101 kg)   LMP 09/11/2018 (Approximate)   BMI 35.93 kg/m   Height: 5\' 6"  (167.6 cm)  Ht Readings from Last 3 Encounters:  09/19/18 5\' 6"  (1.676 m)  06/06/18 5' 6.14" (1.68 m)  06/25/17 5' 6.75" (1.695 m)    General appearance: alert, cooperative and  appears stated age Head: Normocephalic, without obvious abnormality, atraumatic Neck: no adenopathy, supple, symmetrical, trachea midline and thyroid normal to inspection and palpation Lungs: clear to auscultation bilaterally Breasts: normal appearance, no masses or tenderness Heart: regular rate and rhythm Abdomen: soft, non-tender; bowel sounds normal; no masses,  no organomegaly Extremities: extremities normal, atraumatic, no cyanosis or edema Skin: Skin color, texture, turgor normal. No rashes or lesions Lymph nodes: Cervical, supraclavicular, and axillary nodes normal. No abnormal inguinal nodes palpated Neurologic: Grossly normal   Pelvic: External genitalia:  no lesions              Urethra:  normal appearing urethra with no masses, tenderness or lesions              Bartholins and Skenes: normal                 Vagina: normal appearing vagina with normal color and discharge, no lesions              Cervix: no lesions              Pap taken: Yes.   Bimanual Exam:  Uterus:  normal size, contour, position, consistency, mobility, non-tender              Adnexa: normal adnexa and no mass, fullness, tenderness               Rectovaginal: Confirms               Anus:  normal sphincter tone, no lesions  Chaperone was present for exam.  A:  Well Woman with normal exam H/o abnormal pap smears H/o pelvic pain Iron deficiency  Alopecia earlier this year Smoker Vit D deficiency   P:   Mammogram guidelines reviewed.  Will start MMGs around age 42. pap smear with HR HPV obtained today Referral to hematology placed today She is ready to proceed with more definitive treatment this year.  Is ready to proceed with hysterectomy. Total testosterone obtained today Return annually or prn

## 2018-09-20 ENCOUNTER — Telehealth: Payer: Self-pay | Admitting: Hematology

## 2018-09-20 NOTE — Telephone Encounter (Signed)
lmom to inform patient of date/time/location of new patient appt 9/22 at 12 pm

## 2018-09-21 LAB — SPECIMEN STATUS

## 2018-09-21 LAB — CYTOLOGY - PAP
Diagnosis: NEGATIVE
HPV: NOT DETECTED

## 2018-09-22 LAB — TESTOSTERONE, TOTAL, LC/MS/MS: Testosterone, total: 19.2 ng/dL (ref 10.0–55.0)

## 2018-10-07 ENCOUNTER — Other Ambulatory Visit: Payer: Self-pay | Admitting: Hematology

## 2018-10-07 DIAGNOSIS — D500 Iron deficiency anemia secondary to blood loss (chronic): Secondary | ICD-10-CM | POA: Insufficient documentation

## 2018-10-07 DIAGNOSIS — N920 Excessive and frequent menstruation with regular cycle: Secondary | ICD-10-CM | POA: Insufficient documentation

## 2018-10-07 NOTE — Progress Notes (Signed)
[facility] NOTE  Patient Care Team: Vinnie Langton as PCP - General (Physician Assistant)  HEME/ONC OVERVIEW: 1. Iron deficiency anemia secondary to menorrhagia -Baseline Hgb ~14   TREATMENT REGIMEN:  PRN IV iron   ASSESSMENT & PLAN:   Iron deficiency anemia -I reviewed the patient's records in detail, including gynecology clinic notes and lab studies -In summary, pt has hx of menorrhagia and presented to her gynecologist for routine follow-up. Her labs from [facility] in 07/2018 was notable for iron level of 36 and ferritin of 16. She has not had any repeated labs since then. She was referred to hematology for further evaluation. -Hgb 12.7 today, lower than her baseline  -I personally reviewed the patient's peripheral blood smear today.  The red blood cells were relatively normal in morphology.  There was no schistocytosis.  The white blood cells were of normal morphology. There were no peripheral circulating blasts. The platelets were of normal size and I verified that there were no platelet clumping. -We discussed some of the risks, benefits, and alternatives of intravenous iron infusions.  -The patient is symptomatic from anemia (headache, hair loss, and fatigue) and the iron level is low.  Furthermore, she has tried various oral iron supplements but had significant side effects, including GI upset; therefore, continuing oral iron supplement is not an option.  In this case, IV iron can replete iron storage quickly and help restore adequate hematopoesis.  -Some of the side-effects to be expected including risks of infusion reactions, phlebitis, headaches, nausea and fatigue.   -The patient is willing to proceed.  We will tentatively plan for Venofer weekly x 2, 1st dose on 10/20/2018.  -Goal is to keep ferritin level greater than 50.  Headache -Possibly due to iron deficiency; other ddx includes tension headache and caffeine withdrawal  headache -No focal neurologic deficit on exam -I counseled the patient on reducing caffeine intake -I also cautioned her that her headache may not resolve with IV iron, and in that case, she should continue follow-up with PCP for further evaluation   Menorrhagia -I discussed with the patient that iron supplementation (IV or oral) is only a temporary fix, and until her menorrhagia is controlled, she will have periodic worsening of her iron deficiency anemia -Therefore, I emphasized the importance of following up with her gynecologist to discuss options to address her menorrhagia -Patient expressed understanding, and agreed to the plan  Orders Placed This Encounter  Procedures  . CBC with Differential (Cancer Center Only)    Standing Status:   Future    Standing Expiration Date:   11/15/2019  . CMP ([facility] only)    Standing Status:   Future    Standing Expiration Date:   11/15/2019  . Save Smear (SSMR)    Standing Status:   Future    Standing Expiration Date:   10/11/2019  . Ferritin    Standing Status:   Future    Standing Expiration Date:   11/15/2019  . Iron and TIBC    Standing Status:   Future    Standing Expiration Date:   11/15/2019   All questions were answered. The patient knows to call the clinic with any problems, questions or concerns.  Return in 3 months for labs and clinic follow-up.   Tish Men, MD 10/11/2018 12:54 PM   CHIEF COMPLAINTS/PURPOSE OF CONSULTATION:  "I just want to get my problem fixed"  HISTORY OF PRESENTING ILLNESS:  Betty Leonard 38 y.o.  female is here because of iron deficiency anemia secondary to menorrhagia. Patient has hx of menorrhagia and presented to her gynecologist for routine follow-up. Her labs from [facility] in 07/2018 was notable for iron level of 36 and ferritin of 16. She has not had any repeated labs since then. She was referred to hematology for further evaluation.  Patient reports that she has chronic fatigue,  as well as retro-orbital headache for the past 2 years.  The headache occurs daily, usually at night, mild in intensity, and not associated with any focal neurological deficits, such as vision change or change in unilateral extremity strength/sensation.  He drinks multiple cups of coffee a day, but usually tries to stop drinking coffee after the morning.  She also reports heavy menstrual cycle, lasting 5 days, very heavy during the first 2 to 3 days, for which her gynecologist was discussed interacting past, but she has declined surgical intervention up to this point.  She denies any other symptoms of bleeding, such as hematochezia, melena or hematuria.  She has been trying to stay on an oral iron supplement, but despite changing multiple formulations, she still has significant GI upset, and has to take Zofran for nausea just to keep the iron supplement down.  She denies any other complaint today.   I have reviewed her chart and materials related to her cancer extensively and collaborated history with the patient. Summary of oncologic history is as follows: Oncology History   No history exists.    MEDICAL HISTORY:  Past Medical History:  Diagnosis Date  . Chicken pox   . Depression   . Esophagitis 12/24/2011  . Frequent headaches   . GERD (gastroesophageal reflux disease)   . Hemorrhage of rectum and anus 12/16/2011  . Migraines   . Migraines   . Nausea with vomiting, chronic - followed by Dr. Deatra Ina and GI 12/16/2011  . Seasonal allergies   . UTI (urinary tract infection)     SURGICAL HISTORY: Past Surgical History:  Procedure Laterality Date  . ESOPHAGOGASTRODUODENOSCOPY  12/24/2011   Procedure: ESOPHAGOGASTRODUODENOSCOPY (EGD);  Surgeon: Inda Castle, MD;  Location: [facility];  Service: Endoscopy;  Laterality: N/A;  . FOOT SURGERY Right    repair remotely after a fracture  . FOOT SURGERY Right 1'2020  . KNEE ARTHROSCOPY Left 2018    SOCIAL HISTORY: Social History    Socioeconomic History  . Marital status: Single    Spouse name: Not on file  . Number of children: Not on file  . Years of education: Not on file  . Highest education level: Not on file  Occupational History  . Not on file  Social Needs  . Financial resource strain: Not on file  . Food insecurity    Worry: Not on file    Inability: Not on file  . Transportation needs    Medical: Not on file    Non-medical: Not on file  Tobacco Use  . Smoking status: Current Every Day Smoker    Packs/day: 1.00    Years: 5.00    Pack years: 5.00    Types: Cigarettes  . Smokeless tobacco: Never Used  Substance and Sexual Activity  . Alcohol use: Yes    Alcohol/week: 2.0 standard drinks    Types: 2 Glasses of wine per week    Comment: occ  . Drug use: No  . Sexual activity: Not Currently    Birth control/protection: None  Lifestyle  . Physical activity    Days per  week: Not on file    Minutes per session: Not on file  . Stress: Not on file  Relationships  . Social Herbalist on phone: Not on file    Gets together: Not on file    Attends religious service: Not on file    Active member of club or organization: Not on file    Attends meetings of clubs or organizations: Not on file    Relationship status: Not on file  . Intimate partner violence    Fear of current or ex partner: Not on file    Emotionally abused: Not on file    Physically abused: Not on file    Forced sexual activity: Not on file  Other Topics Concern  . Not on file  Social History Narrative   Work or Database administrator, [employer]      Home Situation: lives with roomate      Spiritual Beliefs: Christian      Lifestyle: CV exercise (27minutes) 3 days per week; diet is poor                FAMILY HISTORY: Family History  Problem Relation Age of Onset  . Leukemia Paternal Grandfather   . Lung cancer Paternal Grandfather   . Arthritis Paternal Grandfather   . Diabetes Paternal Grandfather   . Heart  disease Paternal Grandfather   . Hypertension Paternal Grandfather   . Kidney disease Paternal Grandfather   . Prostate cancer Paternal Grandfather   . Gaucher's disease Paternal Grandfather   . Sudden death Father        age 27, nothing found on autopsy  . Diabetes Father   . Hyperlipidemia Father   . Hypertension Father   . Mental illness Mother        bipolar, schizophrenia  . Alcohol abuse Maternal Grandfather   . Stroke Paternal Grandmother   . Colon cancer Neg Hx     ALLERGIES:  is allergic to codeine; iodine; shellfish allergy; and aspirin.  MEDICATIONS:  Current Outpatient Medications  Medication Sig Dispense Refill  . cetirizine (ZYRTEC) 10 MG tablet Take 1 tablet (10 mg total) by mouth daily. 30 tablet 11  . EPINEPHrine 0.3 mg/0.3 mL IJ SOAJ injection Inject 0.3 mg into the muscle once.    . ferrous sulfate 325 (65 FE) MG tablet Take 325 mg by mouth daily with breakfast.    . ondansetron (ZOFRAN) 8 MG tablet Take 8 mg by mouth daily.    . Vitamin D, Ergocalciferol, (DRISDOL) 1.25 MG (50000 UT) CAPS capsule      No current facility-administered medications for this visit.     REVIEW OF SYSTEMS:   Constitutional: ( - ) fevers, ( - )  chills , ( - ) night sweats Eyes: ( - ) blurriness of vision, ( - ) double vision, ( - ) watery eyes Ears, nose, mouth, throat, and face: ( - ) mucositis, ( - ) sore throat Respiratory: ( - ) cough, ( - ) dyspnea, ( - ) wheezes Cardiovascular: ( - ) palpitation, ( - ) chest discomfort, ( - ) lower extremity swelling Gastrointestinal:  ( + ) nausea, ( - ) heartburn, ( - ) change in bowel habits Skin: ( - ) abnormal skin rashes Lymphatics: ( - ) new lymphadenopathy, ( - ) easy bruising Neurological: ( - ) numbness, ( - ) tingling, ( - ) new weaknesses Behavioral/Psych: ( - ) mood change, ( - ) new changes  All other systems  were reviewed with the patient and are negative.  PHYSICAL EXAMINATION: ECOG PERFORMANCE STATUS: 1 - Symptomatic  but completely ambulatory  Vitals:   10/11/18 1241  BP: 127/64  Pulse: 85  Resp: 18  Temp: 98 F (36.7 C)  SpO2: 100%   Filed Weights   10/11/18 1241  Weight: 225 lb 12.8 oz (102.4 kg)    GENERAL: alert, no distress and comfortable SKIN: skin color, texture, turgor are normal, no rashes or significant lesions EYES: conjunctiva are pink and non-injected, sclera clear OROPHARYNX: no exudate, no erythema; lips, buccal mucosa, and tongue normal  NECK: supple, non-tender LUNGS: clear to auscultation with normal breathing effort HEART: regular rate & rhythm, no murmurs, no lower extremity edema ABDOMEN: soft, non-tender, non-distended, normal bowel sounds Musculoskeletal: no cyanosis of digits and no clubbing  PSYCH: alert & oriented x 3, fluent speech NEURO: no focal motor/sensory deficits  LABORATORY DATA:  I have reviewed the data as listed Lab Results  Component Value Date   WBC 9.2 10/11/2018   HGB 12.7 10/11/2018   HCT 39.1 10/11/2018   MCV 81.8 10/11/2018   PLT 382 10/11/2018   Lab Results  Component Value Date   NA 138 10/11/2018   K 3.8 10/11/2018   CL 106 10/11/2018   CO2 24 10/11/2018    RADIOGRAPHIC STUDIES: I have personally reviewed the radiological images as listed and agreed with the findings in the report.  PATHOLOGY: I have reviewed the pathology reports as documented in the oncologist history.

## 2018-10-11 ENCOUNTER — Other Ambulatory Visit: Payer: Self-pay

## 2018-10-11 ENCOUNTER — Telehealth: Payer: Self-pay | Admitting: Hematology

## 2018-10-11 ENCOUNTER — Encounter: Payer: Self-pay | Admitting: Hematology

## 2018-10-11 ENCOUNTER — Inpatient Hospital Stay: Payer: 59

## 2018-10-11 ENCOUNTER — Inpatient Hospital Stay: Payer: 59 | Attending: Hematology | Admitting: Hematology

## 2018-10-11 VITALS — BP 127/64 | HR 85 | Temp 98.0°F | Resp 18 | Ht 66.0 in | Wt 225.8 lb

## 2018-10-11 DIAGNOSIS — Z8041 Family history of malignant neoplasm of ovary: Secondary | ICD-10-CM

## 2018-10-11 DIAGNOSIS — F329 Major depressive disorder, single episode, unspecified: Secondary | ICD-10-CM | POA: Diagnosis not present

## 2018-10-11 DIAGNOSIS — F17210 Nicotine dependence, cigarettes, uncomplicated: Secondary | ICD-10-CM

## 2018-10-11 DIAGNOSIS — Z806 Family history of leukemia: Secondary | ICD-10-CM

## 2018-10-11 DIAGNOSIS — D500 Iron deficiency anemia secondary to blood loss (chronic): Secondary | ICD-10-CM

## 2018-10-11 DIAGNOSIS — N920 Excessive and frequent menstruation with regular cycle: Secondary | ICD-10-CM

## 2018-10-11 DIAGNOSIS — Z801 Family history of malignant neoplasm of trachea, bronchus and lung: Secondary | ICD-10-CM

## 2018-10-11 DIAGNOSIS — Z79899 Other long term (current) drug therapy: Secondary | ICD-10-CM | POA: Diagnosis not present

## 2018-10-11 DIAGNOSIS — R51 Headache: Secondary | ICD-10-CM

## 2018-10-11 DIAGNOSIS — R519 Headache, unspecified: Secondary | ICD-10-CM

## 2018-10-11 LAB — CBC WITH DIFFERENTIAL (CANCER CENTER ONLY)
Abs Immature Granulocytes: 0.03 10*3/uL (ref 0.00–0.07)
Basophils Absolute: 0.1 10*3/uL (ref 0.0–0.1)
Basophils Relative: 1 %
Eosinophils Absolute: 0.4 10*3/uL (ref 0.0–0.5)
Eosinophils Relative: 5 %
HCT: 39.1 % (ref 36.0–46.0)
Hemoglobin: 12.7 g/dL (ref 12.0–15.0)
Immature Granulocytes: 0 %
Lymphocytes Relative: 27 %
Lymphs Abs: 2.5 10*3/uL (ref 0.7–4.0)
MCH: 26.6 pg (ref 26.0–34.0)
MCHC: 32.5 g/dL (ref 30.0–36.0)
MCV: 81.8 fL (ref 80.0–100.0)
Monocytes Absolute: 0.6 10*3/uL (ref 0.1–1.0)
Monocytes Relative: 7 %
Neutro Abs: 5.6 10*3/uL (ref 1.7–7.7)
Neutrophils Relative %: 60 %
Platelet Count: 382 10*3/uL (ref 150–400)
RBC: 4.78 MIL/uL (ref 3.87–5.11)
RDW: 14.1 % (ref 11.5–15.5)
WBC Count: 9.2 10*3/uL (ref 4.0–10.5)
nRBC: 0 % (ref 0.0–0.2)

## 2018-10-11 LAB — CMP (CANCER CENTER ONLY)
ALT: 12 U/L (ref 0–44)
AST: 12 U/L — ABNORMAL LOW (ref 15–41)
Albumin: 4.1 g/dL (ref 3.5–5.0)
Alkaline Phosphatase: 63 U/L (ref 38–126)
Anion gap: 8 (ref 5–15)
BUN: 10 mg/dL (ref 6–20)
CO2: 24 mmol/L (ref 22–32)
Calcium: 9.2 mg/dL (ref 8.9–10.3)
Chloride: 106 mmol/L (ref 98–111)
Creatinine: 0.66 mg/dL (ref 0.44–1.00)
GFR, Est AFR Am: >60 mL/min (ref >60)
GFR, Estimated: >60 mL/min (ref >60)
Glucose, Bld: 94 mg/dL (ref 70–99)
Potassium: 3.8 mmol/L (ref 3.5–5.1)
Sodium: 138 mmol/L (ref 135–145)
Total Bilirubin: 0.4 mg/dL (ref 0.3–1.2)
Total Protein: 7.3 g/dL (ref 6.5–8.1)

## 2018-10-11 LAB — SAVE SMEAR (SSMR)

## 2018-10-11 NOTE — Telephone Encounter (Signed)
Called and spoke with patient regarding appointments per 9/22 los

## 2018-10-12 LAB — IRON AND TIBC
Iron: 29 ug/dL — ABNORMAL LOW (ref 41–142)
Saturation Ratios: 10 % — ABNORMAL LOW (ref 21–57)
TIBC: 295 ug/dL (ref 236–444)
UIBC: 266 ug/dL (ref 120–384)

## 2018-10-12 LAB — FERRITIN: Ferritin: 26 ng/mL (ref 11–307)

## 2018-10-13 ENCOUNTER — Ambulatory Visit (INDEPENDENT_AMBULATORY_CARE_PROVIDER_SITE_OTHER): Payer: 59 | Admitting: Obstetrics & Gynecology

## 2018-10-13 ENCOUNTER — Other Ambulatory Visit: Payer: Self-pay

## 2018-10-13 VITALS — BP 118/74 | HR 89 | Temp 98.2°F | Ht 66.0 in | Wt 225.0 lb

## 2018-10-13 DIAGNOSIS — Z8349 Family history of other endocrine, nutritional and metabolic diseases: Secondary | ICD-10-CM | POA: Diagnosis not present

## 2018-10-13 NOTE — Progress Notes (Signed)
GYNECOLOGY  VISIT  CC:   questions  HPI: 38 y.o. G0P0000 Single White or Caucasian female here for discussion of hematology consultation and request for genetic testing.  Reports she as advised at her hematology visit that it didn't really make sense for her to have iron infusions if she wasn't going to have a hysterectomy.  We have discussed this as well but she is just not in a good place for having surgery right now.  She is scheduled for iron infusion next week and then again in 7 days after the first infusion.  She will have repeat blood work in December.r  She does continue to contemplate a hysterectomy but just not right now.    She's recently learned of some family hx that she thought might be important.  Her paternal grandfather was diagnosed in his 8s or 63's with Gaucher disease, Type 1.  She thinks he was diagnosed after having lung cancer but she's not completley sure about these exact details.  She is wondering if some of her symptoms could overlap with this disorder.  Her biological father is deceased and he did not have any genetic testing on this disorder completed.  He died in April 21, 2005 at age 61.  It was ruled natural causes but she thinks it was related to diabetes.  Pt aware this disorder is not something I feel I can order genetic testing for as I would not have any idea about management if this were positive.  Will reach out to Roma Kayser to see if she knows any one with expertise in this disorder so see if genetic testing is/is not appropriate for this pt.    GYNECOLOGIC HISTORY: No LMP recorded. Contraception: none Menopausal hormone therapy: none  Patient Active Problem List   Diagnosis Date Noted  . Iron deficiency anemia due to chronic blood loss 10/07/2018  . Menorrhagia 10/07/2018  . History of wheezing 06/06/2018  . Adverse food reaction 06/06/2018  . Other allergic rhinitis 06/06/2018  . Hair loss 06/06/2018  . History of frequent URI 06/06/2018  . Anxiety and  depression 09/23/2012  . Hx of migraine headaches 09/23/2012  . Tobacco use disorder 09/23/2012  . Nausea with vomiting, chronic - followed by Dr. Deatra Ina and GI 12/16/2011    Past Medical History:  Diagnosis Date  . Chicken pox   . Depression   . Esophagitis 12/24/2011  . Frequent headaches   . GERD (gastroesophageal reflux disease)   . Hemorrhage of rectum and anus 12/16/2011  . Migraines   . Migraines   . Nausea with vomiting, chronic - followed by Dr. Deatra Ina and GI 12/16/2011  . Seasonal allergies   . UTI (urinary tract infection)     Past Surgical History:  Procedure Laterality Date  . ESOPHAGOGASTRODUODENOSCOPY  12/24/2011   Procedure: ESOPHAGOGASTRODUODENOSCOPY (EGD);  Surgeon: Inda Castle, MD;  Location: [facility];  Service: Endoscopy;  Laterality: N/A;  . FOOT SURGERY Right    repair remotely after a fracture  . FOOT SURGERY Right 1'2020  . KNEE ARTHROSCOPY Left 04/21/16    MEDS:   Current Outpatient Medications on File Prior to Visit  Medication Sig Dispense Refill  . cetirizine (ZYRTEC) 10 MG tablet Take 1 tablet (10 mg total) by mouth daily. 30 tablet 11  . EPINEPHrine 0.3 mg/0.3 mL IJ SOAJ injection Inject 0.3 mg into the muscle once.    . ferrous sulfate 325 (65 FE) MG tablet Take 325 mg by mouth daily with breakfast.    .  ondansetron (ZOFRAN) 8 MG tablet Take 8 mg by mouth daily.    . Vitamin D, Ergocalciferol, (DRISDOL) 1.25 MG (50000 UT) CAPS capsule      No current facility-administered medications on file prior to visit.     ALLERGIES: Codeine, Iodine, Shellfish allergy, and Aspirin  Family History  Problem Relation Age of Onset  . Leukemia Paternal Grandfather   . Lung cancer Paternal Grandfather   . Arthritis Paternal Grandfather   . Diabetes Paternal Grandfather   . Heart disease Paternal Grandfather   . Hypertension Paternal Grandfather   . Kidney disease Paternal Grandfather   . Prostate cancer Paternal Grandfather   . Gaucher's disease  Paternal Grandfather   . Sudden death Father        age 52, nothing found on autopsy  . Diabetes Father   . Hyperlipidemia Father   . Hypertension Father   . Mental illness Mother        bipolar, schizophrenia  . Alcohol abuse Maternal Grandfather   . Stroke Paternal Grandmother   . Colon cancer Neg Hx     SH:  Single,smoker  Review of Systems  All other systems reviewed and are negative.   PHYSICAL EXAMINATION:    BP 118/74   Pulse 89   Temp 98.2 F (36.8 C)   Ht 5\' 6"  (1.676 m)   Wt 225 lb (102.1 kg)   SpO2 98%   BMI 36.32 kg/m     General appearance: alert, cooperative and appears stated age No exam performed  Assessment: Family hx of gaucher's disease, Type 1 Iron deficiency anemia which I feel is from her menorrhagia  Plan: Will see if can get genetic testing done for this pt She will plan to proceed with iron infusions and follow up blood work for now.   ~15 minutes spent with patient >50% of time was in face to face discussion of above.

## 2018-10-16 ENCOUNTER — Encounter: Payer: Self-pay | Admitting: Obstetrics & Gynecology

## 2018-10-17 ENCOUNTER — Other Ambulatory Visit: Payer: Self-pay | Admitting: Obstetrics & Gynecology

## 2018-10-17 DIAGNOSIS — Z8349 Family history of other endocrine, nutritional and metabolic diseases: Secondary | ICD-10-CM

## 2018-10-20 ENCOUNTER — Inpatient Hospital Stay: Payer: 59 | Attending: Hematology

## 2018-10-20 ENCOUNTER — Other Ambulatory Visit: Payer: Self-pay

## 2018-10-20 VITALS — BP 115/81 | HR 79 | Temp 97.9°F | Resp 18

## 2018-10-20 DIAGNOSIS — N920 Excessive and frequent menstruation with regular cycle: Secondary | ICD-10-CM | POA: Diagnosis not present

## 2018-10-20 DIAGNOSIS — D500 Iron deficiency anemia secondary to blood loss (chronic): Secondary | ICD-10-CM | POA: Insufficient documentation

## 2018-10-20 MED ORDER — SODIUM CHLORIDE 0.9 % IV SOLN
Freq: Once | INTRAVENOUS | Status: AC
  Administered 2018-10-20: 14:00:00 via INTRAVENOUS
  Filled 2018-10-20: qty 250

## 2018-10-20 MED ORDER — SODIUM CHLORIDE 0.9 % IV SOLN
200.0000 mg | Freq: Once | INTRAVENOUS | Status: AC
  Administered 2018-10-20: 200 mg via INTRAVENOUS
  Filled 2018-10-20: qty 10

## 2018-10-20 NOTE — Patient Instructions (Signed)

## 2018-10-27 ENCOUNTER — Other Ambulatory Visit: Payer: Self-pay

## 2018-10-27 ENCOUNTER — Inpatient Hospital Stay: Payer: 59

## 2018-10-27 VITALS — BP 123/75 | HR 87 | Temp 98.6°F | Resp 20

## 2018-10-27 DIAGNOSIS — D500 Iron deficiency anemia secondary to blood loss (chronic): Secondary | ICD-10-CM

## 2018-10-27 DIAGNOSIS — N920 Excessive and frequent menstruation with regular cycle: Secondary | ICD-10-CM | POA: Diagnosis not present

## 2018-10-27 MED ORDER — SODIUM CHLORIDE 0.9 % IV SOLN
200.0000 mg | Freq: Once | INTRAVENOUS | Status: AC
  Administered 2018-10-27: 200 mg via INTRAVENOUS
  Filled 2018-10-27: qty 10

## 2018-10-27 MED ORDER — SODIUM CHLORIDE 0.9 % IV SOLN
Freq: Once | INTRAVENOUS | Status: AC
  Administered 2018-10-27: 13:00:00 via INTRAVENOUS
  Filled 2018-10-27: qty 250

## 2018-11-03 ENCOUNTER — Telehealth: Payer: Self-pay | Admitting: Obstetrics & Gynecology

## 2018-11-03 NOTE — Telephone Encounter (Signed)
Spoke with patient in regards to surgical planning. Patient states she is not ready to proceed with a hysterectomy.  Patient is asking if there are other treatment options that "are not so drastic".  Patient also desires to see genetics, and is inquiring about the status of referral for  appointment.  Advised patient I will follow up on appointment status and advise.  Patient appreciative to follow up call and is agreeable to a return call.  Forwarding to Lamont Snowball, RN regarding surgery.  Forwarding to [employer]  regarding genetic referral

## 2018-11-04 NOTE — Telephone Encounter (Signed)
Call placed to follow up on referral. I left a detailed message for Sharyne Peach to return my call.

## 2018-11-09 NOTE — Telephone Encounter (Signed)
Follow-up call regardng alternatives in place of [facility]. Patient prefers results of genetic testing before discussing options. Briefly mentioned Mirena, OCP and Novasure- patient has concerns about all of these and will have consult with DR Sabra Heck following genetic testing.  Advised patient to call back on Monday is has not heard update about appointment.

## 2018-11-11 NOTE — Telephone Encounter (Signed)
Spoke with Betty Leonard at [facility]- the referral has been accepted after physician review but due to [place] restrictions there is a 6-8 month wait time for virtual visits. They are not in the office due to [place]. Call placed to patient to convey referral information. I have spoken with the patient she is aware of the wait time and will call the office to speak with the nurse regarding her other options.

## 2018-11-11 NOTE — Telephone Encounter (Signed)
Thanks for the update.  We will wait for pt to make decisions for proceeding any further with surgery scheduling.   Ok to close encounter.

## 2018-12-07 ENCOUNTER — Ambulatory Visit: Payer: 59 | Admitting: Allergy

## 2018-12-08 ENCOUNTER — Telehealth: Payer: Self-pay | Admitting: Hematology

## 2018-12-08 NOTE — Telephone Encounter (Signed)
Appointments moved to [place] for 12/22 due to Dr Maylon Peppers [facility] Letter/calendar mailed

## 2019-01-10 ENCOUNTER — Telehealth: Payer: Self-pay | Admitting: Hematology & Oncology

## 2019-01-10 ENCOUNTER — Ambulatory Visit: Payer: 59 | Admitting: Hematology

## 2019-01-10 ENCOUNTER — Encounter: Payer: Self-pay | Admitting: Family

## 2019-01-10 ENCOUNTER — Other Ambulatory Visit: Payer: Self-pay

## 2019-01-10 ENCOUNTER — Inpatient Hospital Stay (HOSPITAL_BASED_OUTPATIENT_CLINIC_OR_DEPARTMENT_OTHER): Payer: Managed Care, Other (non HMO) | Admitting: Family

## 2019-01-10 ENCOUNTER — Other Ambulatory Visit: Payer: 59

## 2019-01-10 ENCOUNTER — Inpatient Hospital Stay: Payer: Managed Care, Other (non HMO) | Attending: Hematology

## 2019-01-10 VITALS — BP 115/72 | HR 85 | Temp 97.7°F | Resp 18 | Ht 66.0 in | Wt 230.0 lb

## 2019-01-10 DIAGNOSIS — N920 Excessive and frequent menstruation with regular cycle: Secondary | ICD-10-CM | POA: Diagnosis not present

## 2019-01-10 DIAGNOSIS — D500 Iron deficiency anemia secondary to blood loss (chronic): Secondary | ICD-10-CM | POA: Insufficient documentation

## 2019-01-10 LAB — CBC WITH DIFFERENTIAL (CANCER CENTER ONLY)
Abs Immature Granulocytes: 0.03 10*3/uL (ref 0.00–0.07)
Basophils Absolute: 0.1 10*3/uL (ref 0.0–0.1)
Basophils Relative: 1 %
Eosinophils Absolute: 0.2 10*3/uL (ref 0.0–0.5)
Eosinophils Relative: 2 %
HCT: 42.7 % (ref 36.0–46.0)
Hemoglobin: 13.8 g/dL (ref 12.0–15.0)
Immature Granulocytes: 0 %
Lymphocytes Relative: 27 %
Lymphs Abs: 2.6 10*3/uL (ref 0.7–4.0)
MCH: 26.7 pg (ref 26.0–34.0)
MCHC: 32.3 g/dL (ref 30.0–36.0)
MCV: 82.8 fL (ref 80.0–100.0)
Monocytes Absolute: 0.6 10*3/uL (ref 0.1–1.0)
Monocytes Relative: 6 %
Neutro Abs: 6 10*3/uL (ref 1.7–7.7)
Neutrophils Relative %: 64 %
Platelet Count: 388 10*3/uL (ref 150–400)
RBC: 5.16 MIL/uL — ABNORMAL HIGH (ref 3.87–5.11)
RDW: 14 % (ref 11.5–15.5)
WBC Count: 9.5 10*3/uL (ref 4.0–10.5)
nRBC: 0 % (ref 0.0–0.2)

## 2019-01-10 LAB — CMP (CANCER CENTER ONLY)
ALT: 11 U/L (ref 0–44)
AST: 11 U/L — ABNORMAL LOW (ref 15–41)
Albumin: 4.6 g/dL (ref 3.5–5.0)
Alkaline Phosphatase: 63 U/L (ref 38–126)
Anion gap: 9 (ref 5–15)
BUN: 17 mg/dL (ref 6–20)
CO2: 25 mmol/L (ref 22–32)
Calcium: 9.5 mg/dL (ref 8.9–10.3)
Chloride: 102 mmol/L (ref 98–111)
Creatinine: 0.7 mg/dL (ref 0.44–1.00)
GFR, Est AFR Am: >60 mL/min (ref >60)
GFR, Estimated: >60 mL/min (ref >60)
Glucose, Bld: 94 mg/dL (ref 70–99)
Potassium: 4 mmol/L (ref 3.5–5.1)
Sodium: 136 mmol/L (ref 135–145)
Total Bilirubin: 0.6 mg/dL (ref 0.3–1.2)
Total Protein: 7.8 g/dL (ref 6.5–8.1)

## 2019-01-10 LAB — SAVE SMEAR(SSMR), FOR PROVIDER SLIDE REVIEW

## 2019-01-10 NOTE — Telephone Encounter (Signed)
Called and unable to reach patient so I have mailed a letter/calendar per 12/22 los

## 2019-01-10 NOTE — Progress Notes (Signed)
Hematology and Oncology Follow Up Visit  Betty Leonard [id number] 1980/07/23 38 y.o. 01/10/2019   Principle Diagnosis:  Iron deficiency anemia secondary to menorrhagia  Current Therapy:   IV iron as indicated    Interim History:  Betty Leonard had a telephone visit today for follow-up. She received 2 doses of Venofer back in October. She states that the day after each infusion she felt achy all over but this did resolve and she has had no other issues.  She is symptomatic at this time with fatigue, chills and chewing lots of ice.  He cycles continue to be very heavy and she is leaning towards having a hysterectomy in the new year once her new insurance kicks in.  No other blood loss noted.  No fever, n/v, cough, rash, dizziness, SOB, chest pain, palpitations, abdominal pain or changes in bowel or bladder habits.  She has numbness and tingling in her wrist that sometimes goes all the way up her arm. She was given a support brace to wear at night. She did drop a cup of coffee due to the numbness and tingling.  She is eating well and staying properly hydrated. Her weight is stable.   ECOG Performance Status: 1 - Symptomatic but completely ambulatory  Medications:  Allergies as of 01/10/2019      Reactions   Codeine Shortness Of Breath   Iodine Other (See Comments)   Shellfish allergy   Shellfish Allergy Anaphylaxis   Aspirin Hives      Medication List       Accurate as of January 10, 2019  1:21 PM. If you have any questions, ask your nurse or doctor.        cetirizine 10 MG tablet Commonly known as: ZYRTEC Take 1 tablet (10 mg total) by mouth daily.   EPINEPHrine 0.3 mg/0.3 mL Soaj injection Commonly known as: EPI-PEN Inject 0.3 mg into the muscle once.   ferrous sulfate 325 (65 FE) MG tablet Take 325 mg by mouth daily with breakfast.   ondansetron 8 MG tablet Commonly known as: ZOFRAN Take 8 mg by mouth daily.   Vitamin D (Ergocalciferol) 1.25 MG (50000 UT) Caps  capsule Commonly known as: DRISDOL       Allergies:  Allergies  Allergen Reactions  . Codeine Shortness Of Breath  . Iodine Other (See Comments)    Shellfish allergy  . Shellfish Allergy Anaphylaxis  . Aspirin Hives    Past Medical History, Surgical history, Social history, and Family History were reviewed and updated.  Review of Systems: All other 10 point review of systems is negative.   Physical Exam:  vitals were not taken for this visit.   Wt Readings from Last 3 Encounters:  10/13/18 225 lb (102.1 kg)  10/11/18 225 lb 12.8 oz (102.4 kg)  09/19/18 222 lb 9.6 oz (101 kg)     Lab Results  Component Value Date   WBC 9.5 01/10/2019   HGB 13.8 01/10/2019   HCT 42.7 01/10/2019   MCV 82.8 01/10/2019   PLT 388 01/10/2019   Lab Results  Component Value Date   FERRITIN 26 10/11/2018   IRON 29 (L) 10/11/2018   TIBC 295 10/11/2018   UIBC 266 10/11/2018   IRONPCTSAT 10 (L) 10/11/2018   Lab Results  Component Value Date   RBC 5.16 (H) 01/10/2019   No results found for: Nils Pyle [facility] Lab Results  Component Value Date   IGGSERUM 1,067 06/06/2018   IGMSERUM 244 (H) 06/06/2018   No  results found for: Odetta Pink, SPEI   Chemistry      Component Value Date/Time   NA 138 10/11/2018 1159   K 3.8 10/11/2018 1159   CL 106 10/11/2018 1159   CO2 24 10/11/2018 1159   BUN 10 10/11/2018 1159   CREATININE 0.66 10/11/2018 1159      Component Value Date/Time   CALCIUM 9.2 10/11/2018 1159   ALKPHOS 63 10/11/2018 1159   AST 12 (L) 10/11/2018 1159   ALT 12 10/11/2018 1159   BILITOT 0.4 10/11/2018 1159       Impression and Plan: Betty Leonard is a very pleasant 38 yo caucasian female with iron deficiency anemia secondary to menorrhagia. She is symptomatic as mentioned above.  We will see what her iron studies show and bring her back in for infusion if needed.  We will plan to see her back in  another 3 months.  She will contact our office with any questions or concern. We can certainly see her sooner if needed.   Betty Peace, NP 12/22/20201:21 PM

## 2019-01-11 LAB — IRON AND TIBC
Iron: 69 ug/dL (ref 41–142)
Saturation Ratios: 23 % (ref 21–57)
TIBC: 298 ug/dL (ref 236–444)
UIBC: 229 ug/dL (ref 120–384)

## 2019-01-11 LAB — FERRITIN: Ferritin: 64 ng/mL (ref 11–307)

## 2019-02-13 ENCOUNTER — Ambulatory Visit: Payer: 59 | Admitting: Obstetrics & Gynecology

## 2019-03-17 ENCOUNTER — Telehealth: Payer: Self-pay

## 2019-03-17 NOTE — Telephone Encounter (Signed)
Spoke with patient regarding plans for surgery. Patient states that she is not able to be seen for genetic counseling and testing with [facility] until 11/2019. Patient would like to come in to discuss alternative options to [facility] with Dr.Miller. Appointment scheduled for 03/27/2019 at 1:30 pm. Patient is agreeable to date and time.  Routing to provider and will close encounter.

## 2019-03-17 NOTE — Telephone Encounter (Signed)
Left message to call Chianne Byrns at [phone]. 

## 2019-03-24 ENCOUNTER — Other Ambulatory Visit: Payer: Self-pay

## 2019-03-27 ENCOUNTER — Ambulatory Visit (INDEPENDENT_AMBULATORY_CARE_PROVIDER_SITE_OTHER): Payer: 59 | Admitting: Obstetrics & Gynecology

## 2019-03-27 ENCOUNTER — Encounter: Payer: Self-pay | Admitting: Obstetrics & Gynecology

## 2019-03-27 ENCOUNTER — Other Ambulatory Visit: Payer: Self-pay

## 2019-03-27 VITALS — BP 112/70 | HR 84 | Temp 97.7°F | Resp 10 | Ht 66.0 in | Wt 227.0 lb

## 2019-03-27 DIAGNOSIS — D500 Iron deficiency anemia secondary to blood loss (chronic): Secondary | ICD-10-CM

## 2019-03-27 DIAGNOSIS — N921 Excessive and frequent menstruation with irregular cycle: Secondary | ICD-10-CM | POA: Diagnosis not present

## 2019-03-27 NOTE — Patient Instructions (Signed)
Head to [email or web address] to schedule your appointment indoors or in the drive-thru, or call the [facility] at [phone].

## 2019-03-27 NOTE — Progress Notes (Signed)
GYNECOLOGY  VISIT  HPI: 39 y.o. G0P0000 Single White or Caucasian female here for discuss alternative surgery options.  She has seen hematology and did receive 3 iron infusions.  She continues to have heavy and irregular bleeding.  She has considered having a hysterectomy but does not feel she has time for that right now.  Would like to discuss other options.  She does not want to be on hormonal therapy if possible.  Endometrial ablation is discussed.  She has done some research about this and this was what she hoped we would discuss.    Ultrasound and endometrial biopsy recommended.  Last PUS was done 04/2017.    Last pap and HR HPV 08/2018.  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    She has stopped smoking since December.  She feels much better not smoking.   Pt has recently started going to [facility] for weight loss.  She has questions about PCOS.  She has recently started metformin.  She does not have elevated androgen levels and ovaries do not have PCOS appearance.    Procedure discussed with patient.  Recovery and pain management discussed.  Risks discussed including but not limited to bleeding, rare risk of transfusion, infection, 1% risk of uterine perforation with risks of fluid deficit causing cardiac arrythmia, cerebral swelling and/or need to stop procedure early.  Fluid emboli and rare risk of death discussed.  DVT/PE, rare risk of risk of bowel/bladder/ureteral/vascular injury.  Failure rate and need for future contraception all dicussed.  Not SA so does not need procedure for contraception at this time.  All questions answered.    Pt is CLEARLY aware she should not be pregnant after this procedure.  Is not SA and does not plan to be.  Understands if this changes, she should let me know for discussion of options.  Tubal excision/occlusion procedures reviewed today.  Does not want to do this with endometrial ablation at this time as has not concerns about pregnancy.  GYNECOLOGIC HISTORY: Patient's last  menstrual period was 03/24/2019. Contraception: abstinence Menopausal hormone therapy: n/a  Patient Active Problem List   Diagnosis Date Noted  . Iron deficiency anemia due to chronic blood loss 10/07/2018  . Menorrhagia 10/07/2018  . History of wheezing 06/06/2018  . Adverse food reaction 06/06/2018  . Other allergic rhinitis 06/06/2018  . Hair loss 06/06/2018  . History of frequent URI 06/06/2018  . Anxiety and depression 09/23/2012  . Hx of migraine headaches 09/23/2012  . Tobacco use disorder 09/23/2012  . Nausea with vomiting, chronic - followed by Dr. Deatra Ina and GI 12/16/2011    Past Medical History:  Diagnosis Date  . Chicken pox   . Depression   . Esophagitis 12/24/2011  . Frequent headaches   . GERD (gastroesophageal reflux disease)   . Hemorrhage of rectum and anus 12/16/2011  . Migraines   . Migraines   . Nausea with vomiting, chronic - followed by Dr. Deatra Ina and GI 12/16/2011  . Seasonal allergies   . UTI (urinary tract infection)     Past Surgical History:  Procedure Laterality Date  . ESOPHAGOGASTRODUODENOSCOPY  12/24/2011   Procedure: ESOPHAGOGASTRODUODENOSCOPY (EGD);  Surgeon: Inda Castle, MD;  Location: [facility];  Service: Endoscopy;  Laterality: N/A;  . FOOT SURGERY Right    repair remotely after a fracture  . FOOT SURGERY Right 1'2020  . KNEE ARTHROSCOPY Left 2018    MEDS:   Current Outpatient Medications on File Prior to Visit  Medication Sig Dispense  Refill  . albuterol (VENTOLIN HFA) 108 (90 Base) MCG/ACT inhaler SMARTSIG:2 Puff(s) By Mouth 4 Times Daily    . cetirizine (ZYRTEC) 10 MG tablet Take 1 tablet (10 mg total) by mouth daily. 30 tablet 11  . Cyanocobalamin (B-12 COMPLIANCE INJECTION IJ) Inject as directed.    Marland Kitchen EPINEPHrine 0.3 mg/0.3 mL IJ SOAJ injection Inject 0.3 mg into the muscle once.    . metFORMIN (GLUCOPHAGE) 500 MG tablet Take 500 mg by mouth daily.    Marland Kitchen METHIONINE-INOS-CHOLINE-LCARN IJ Inject as directed.    .  ondansetron (ZOFRAN) 8 MG tablet Take 8 mg by mouth daily.    . Vitamin D, Ergocalciferol, (DRISDOL) 1.25 MG (50000 UT) CAPS capsule      No current facility-administered medications on file prior to visit.    ALLERGIES: Codeine, Iodine, Shellfish allergy, and Aspirin  Family History  Problem Relation Age of Onset  . Leukemia Paternal Grandfather   . Lung cancer Paternal Grandfather   . Arthritis Paternal Grandfather   . Diabetes Paternal Grandfather   . Heart disease Paternal Grandfather   . Hypertension Paternal Grandfather   . Kidney disease Paternal Grandfather   . Prostate cancer Paternal Grandfather   . Gaucher's disease Paternal Grandfather   . Sudden death Father        age 65, nothing found on autopsy  . Diabetes Father   . Hyperlipidemia Father   . Hypertension Father   . Mental illness Mother        bipolar, schizophrenia  . Alcohol abuse Maternal Grandfather   . Stroke Paternal Grandmother   . Colon cancer Neg Hx     SH:  Single, non smoker (as of 12/29/2018)  Review of Systems  All other systems reviewed and are negative.   PHYSICAL EXAMINATION:    BP 112/70 (BP Location: Left Arm, Patient Position: Sitting, Cuff Size: Normal)   Pulse 84   Temp 97.7 F (36.5 C) (Temporal)   Resp 10   Ht 5\' 6"  (1.676 m)   Wt 227 lb (103 kg)   LMP 03/24/2019   BMI 36.64 kg/m     General appearance: alert, cooperative and appears stated age No exam performed today  Assessment: Menorrhagia with hx of iron deficiency anemia requiring 3 iron infusions (current iron levels are good) Desires not being on hormonal medication Quit smoking 12/29/2018  Plan: Will proceed with PUS and endometrial ablation as well as precert procedure and surgical scheduling.   ~30 minutes spent with patient in discussion of treatment options, risks and benefits of options.

## 2019-03-28 ENCOUNTER — Telehealth: Payer: Self-pay | Admitting: Obstetrics & Gynecology

## 2019-03-28 DIAGNOSIS — N921 Excessive and frequent menstruation with irregular cycle: Secondary | ICD-10-CM

## 2019-03-28 NOTE — Telephone Encounter (Signed)
EMB orders placed. Routing to [employer]

## 2019-03-28 NOTE — Telephone Encounter (Signed)
Call to patient to review benefits and schedule recommended PUS with Dr. Sabra Heck. Patient stated that Dr. Sabra Heck would also like to do a EMB. No EMB orders in Epic.

## 2019-04-04 ENCOUNTER — Other Ambulatory Visit (HOSPITAL_COMMUNITY)
Admission: RE | Admit: 2019-04-04 | Discharge: 2019-04-04 | Disposition: A | Discharge status: Home / Self Care | Payer: 59 | Source: Ambulatory Visit | Attending: Obstetrics & Gynecology | Admitting: Obstetrics & Gynecology

## 2019-04-04 ENCOUNTER — Ambulatory Visit (INDEPENDENT_AMBULATORY_CARE_PROVIDER_SITE_OTHER): Payer: 59 | Admitting: Obstetrics & Gynecology

## 2019-04-04 ENCOUNTER — Other Ambulatory Visit: Payer: Self-pay

## 2019-04-04 ENCOUNTER — Encounter: Payer: Self-pay | Admitting: Obstetrics & Gynecology

## 2019-04-04 ENCOUNTER — Ambulatory Visit (INDEPENDENT_AMBULATORY_CARE_PROVIDER_SITE_OTHER): Payer: 59

## 2019-04-04 VITALS — BP 120/60 | HR 80 | Temp 98.2°F | Ht 66.0 in | Wt 227.0 lb

## 2019-04-04 DIAGNOSIS — N921 Excessive and frequent menstruation with irregular cycle: Secondary | ICD-10-CM

## 2019-04-04 DIAGNOSIS — D251 Intramural leiomyoma of uterus: Secondary | ICD-10-CM | POA: Diagnosis not present

## 2019-04-04 NOTE — Progress Notes (Signed)
39 y.o. G0P0000 Single White or Caucasian female here for pelvic ultrasound due to menorrhagia.  She is has been considering hysterectomy but just does not think she has the time for recovery at this time.  She wants is interested in an endometrial ablation and here for PUS and endometrial biopsy.  On separate topic, she has an appt on Thursday at the [facility] at 4:15pm.    Patient's last menstrual period was 03/24/2019 (exact date).  Contraception: abstinence   Findings:  UTERUS: 7.8 x 4.9 x 4.0 cm with 0.8 x 0.6 cm intramural fibroid, posteriorly EMS: 6.7 mm, 3D image shows normal shaped cavity ADNEXA: Left ovary: 2.7 x 2.1 x 1.5 cm       Right ovary: 4.0 x 2.2 x 2.9 cm with 1.4 cm follicle CUL DE SAC: No free fluid  Discussion: Ultrasonographer supervised.  Images shown to patient and findings discussed.  New finding of small fibroid noted today.  Last ultrasound was May 06, 2017, this was not present.  Discussed this would not interfere with planned endometrial ablation.  We will proceed with endometrial biopsy for endometrial evaluation today as well.  Endometrial biopsy recommended.  Discussed with patient.  Verbal and written consent obtained.   Procedure:  Speculum placed.  Cervix visualized and cleansed with betadine prep.  A single toothed tenaculum was applied to the anterior lip of the cervix.  Endometrial pipelle was advanced through the cervix into the endometrial cavity without difficulty.  Pipelle passed to 7.5cm.  Suction applied and pipelle removed with good tissue sample obtained.  Tenculum removed.  No bleeding noted.  Patient tolerated procedure well.  Assessment:  Menorrhagia without any evidence of adenomyosis on ultrasound Small posterior, intramural fibroid  Plan:  If biopsy is negative, plan to proceed with hysteroscopy with endometrial ablation

## 2019-04-05 ENCOUNTER — Telehealth: Payer: Self-pay

## 2019-04-05 NOTE — Telephone Encounter (Signed)
Spoke with patient. Patient s/p EMB 04/04/19. Patient reports burning in vaginal canal and pelvic pressure with feeling of urinary urgency. Voiding normal amounts. Reports clear vaginal mucous with streaks of blood, denies any heavy bleeding or pelvic pain. Has changed pad twice today. Denies vaginal odor, dysuria, flank pain, fever/chills, N/V.   Advised patient spotting and mild cramping not uncommon from EMB, recommended further evaluation of burning and pressure. OV scheduled for 3/18 at 9:30am with Dr. Sabra Heck. Patient will call tomorrow morning at 8am and provide update, will determine if OV still needed. Advised patient Dr. Sabra Heck is out of the office today, will forward for her to review, will return call if any additional recommendations. Patient is aware to return call if new symptoms develop or symptoms worsen.   Routing to Dr. Sabra Heck.

## 2019-04-05 NOTE — Telephone Encounter (Signed)
Patient called with questions reguarding procedures done on 04/04/19 and a possible UTI.

## 2019-04-06 ENCOUNTER — Other Ambulatory Visit: Payer: Self-pay

## 2019-04-06 ENCOUNTER — Ambulatory Visit (INDEPENDENT_AMBULATORY_CARE_PROVIDER_SITE_OTHER): Payer: 59 | Admitting: Obstetrics & Gynecology

## 2019-04-06 ENCOUNTER — Ambulatory Visit: Payer: Managed Care, Other (non HMO)

## 2019-04-06 ENCOUNTER — Encounter: Payer: Self-pay | Admitting: Obstetrics & Gynecology

## 2019-04-06 VITALS — BP 118/78 | HR 88 | Temp 97.5°F | Resp 10 | Ht 66.0 in | Wt 229.0 lb

## 2019-04-06 DIAGNOSIS — R102 Pelvic and perineal pain: Secondary | ICD-10-CM

## 2019-04-06 DIAGNOSIS — R350 Frequency of micturition: Secondary | ICD-10-CM | POA: Diagnosis not present

## 2019-04-06 LAB — POCT URINALYSIS DIPSTICK
Bilirubin, UA: NEGATIVE
Glucose, UA: NEGATIVE
Ketones, UA: NEGATIVE
Leukocytes, UA: NEGATIVE
Nitrite, UA: NEGATIVE
Protein, UA: NEGATIVE
Urobilinogen, UA: 0.2 E.U./dL
pH, UA: 5 (ref 5.0–8.0)

## 2019-04-06 LAB — SURGICAL PATHOLOGY

## 2019-04-06 MED ORDER — FLUCONAZOLE 150 MG PO TABS: ORAL_TABLET | ORAL | 0 refills | Status: DC

## 2019-04-06 MED ORDER — DOXYCYCLINE HYCLATE 100 MG PO CAPS: 100.0000 mg | ORAL_CAPSULE | Freq: Two times a day (BID) | ORAL | 0 refills | Status: DC

## 2019-04-06 NOTE — Progress Notes (Signed)
GYNECOLOGY  VISIT  CC:   Patient complains of having vaginal burning, frequency, pressure, "feels like a balloon in pelvis"  HPI: 39 y.o. G0P0000 Single White or Caucasian female here for UTI symptoms.  She is having burning with urinary and urinary urgency.  She is having a little streaky vaginal bleeding since endometrial biopsy.  Results are note finalized at this point.  She is having pelvic pressure.    GYNECOLOGIC HISTORY: Patient's last menstrual period was 03/24/2019 (exact date). Contraception: Abstinence Menopausal hormone therapy: none  Patient Active Problem List   Diagnosis Date Noted  . Iron deficiency anemia due to chronic blood loss 10/07/2018  . Menorrhagia 10/07/2018  . History of wheezing 06/06/2018  . Adverse food reaction 06/06/2018  . Other allergic rhinitis 06/06/2018  . Hair loss 06/06/2018  . History of frequent URI 06/06/2018  . Anxiety and depression 09/23/2012  . Hx of migraine headaches 09/23/2012  . Tobacco use disorder 09/23/2012  . Nausea with vomiting, chronic - followed by Dr. Deatra Ina and GI 12/16/2011    Past Medical History:  Diagnosis Date  . Chicken pox   . Depression   . Esophagitis 12/24/2011  . Frequent headaches   . GERD (gastroesophageal reflux disease)   . Hemorrhage of rectum and anus 12/16/2011  . Migraines   . Migraines   . Nausea with vomiting, chronic - followed by Dr. Deatra Ina and GI 12/16/2011  . Seasonal allergies   . UTI (urinary tract infection)     Past Surgical History:  Procedure Laterality Date  . ESOPHAGOGASTRODUODENOSCOPY  12/24/2011   Procedure: ESOPHAGOGASTRODUODENOSCOPY (EGD);  Surgeon: Inda Castle, MD;  Location: [facility];  Service: Endoscopy;  Laterality: N/A;  . FOOT SURGERY Right    repair remotely after a fracture  . FOOT SURGERY Right 1'2020  . KNEE ARTHROSCOPY Left 2018    MEDS:   Current Outpatient Medications on File Prior to Visit  Medication Sig Dispense Refill  . albuterol (VENTOLIN  HFA) 108 (90 Base) MCG/ACT inhaler SMARTSIG:2 Puff(s) By Mouth 4 Times Daily    . cetirizine (ZYRTEC) 10 MG tablet Take 1 tablet (10 mg total) by mouth daily. 30 tablet 11  . Cyanocobalamin (B-12 COMPLIANCE INJECTION IJ) Inject as directed.    Marland Kitchen EPINEPHrine 0.3 mg/0.3 mL IJ SOAJ injection Inject 0.3 mg into the muscle once.    . metFORMIN (GLUCOPHAGE) 500 MG tablet Take 500 mg by mouth daily.    Marland Kitchen METHIONINE-INOS-CHOLINE-LCARN IJ Inject as directed.    . ondansetron (ZOFRAN) 8 MG tablet Take 8 mg by mouth daily.    . Vitamin D, Ergocalciferol, (DRISDOL) 1.25 MG (50000 UT) CAPS capsule      No current facility-administered medications on file prior to visit.    ALLERGIES: Codeine, Iodine, Shellfish allergy, and Aspirin  Family History  Problem Relation Age of Onset  . Leukemia Paternal Grandfather   . Lung cancer Paternal Grandfather   . Arthritis Paternal Grandfather   . Diabetes Paternal Grandfather   . Heart disease Paternal Grandfather   . Hypertension Paternal Grandfather   . Kidney disease Paternal Grandfather   . Prostate cancer Paternal Grandfather   . Gaucher's disease Paternal Grandfather   . Sudden death Father        age 85, nothing found on autopsy  . Diabetes Father   . Hyperlipidemia Father   . Hypertension Father   . Mental illness Mother        bipolar, schizophrenia  . Alcohol abuse Maternal Grandfather   .  Stroke Paternal Grandmother   . Colon cancer Neg Hx     SH:  Single, non smoker  Review of Systems  Genitourinary: Positive for difficulty urinating and frequency.       Vaginal burning  All other systems reviewed and are negative.   PHYSICAL EXAMINATION:    BP 118/78 (BP Location: Right Arm, Patient Position: Sitting, Cuff Size: Normal)   Pulse 88   Temp (!) 97.5 F (36.4 C) (Temporal)   Resp 10   Ht 5\' 6"  (1.676 m)   Wt 229 lb (103.9 kg)   LMP 03/24/2019 (Exact Date)   BMI 36.96 kg/m     General appearance: alert, cooperative and appears  stated age Flank: no CVA tenderness Abdomen: soft, non-tender; bowel sounds normal; no masses,  no organomegaly Lymph:  no inguinal LAD noted  Pelvic: External genitalia:  no lesions              Urethra:  normal appearing urethra with no masses, tenderness or lesions              Bartholins and Skenes: normal                 Vagina: normal appearing vagina with normal color and discharge, no lesions              Cervix: no lesions              Bimanual Exam:  Uterus: normal size, no masses or enlargement, mild CMT              Adnexa: no mass, fullness, tenderness  Assessment: Possible early endometritis vs UTI  Plan: Urine culture pending  Doxycycline 100mg  bid x 10 days to pharmacy.  Pt knows to take with food and be careful with sun exposure Diflucan 150mg  po x 1, repeat 72 hours.  #2/0RF.

## 2019-04-08 ENCOUNTER — Ambulatory Visit: Payer: 59 | Attending: Internal Medicine

## 2019-04-08 ENCOUNTER — Encounter: Payer: Self-pay | Admitting: Obstetrics & Gynecology

## 2019-04-08 DIAGNOSIS — Z23 Encounter for immunization: Secondary | ICD-10-CM

## 2019-04-08 LAB — URINE CULTURE

## 2019-04-08 NOTE — Progress Notes (Signed)
   Covid-19 Vaccination Clinic  Name:  Betty Leonard    MRN: [id number] DOB: [birth year]  04/08/2019  [name] was observed post Covid-19 immunization for 15 minutes without incident. She was provided with Vaccine Information Sheet and instruction to access the V-Safe system.   [name] was instructed to call 911 with any severe reactions post vaccine: Marland Kitchen Difficulty breathing  . Swelling of face and throat  . A fast heartbeat  . A bad rash all over body  . Dizziness and weakness   Immunizations Administered    Name Date Dose VIS Date Route   Pfizer COVID-19 Vaccine 04/08/2019  9:17 AM 0.3 mL 12/30/2018 Intramuscular   Manufacturer: [place]   Lot: [id number]   [place]: [id number]

## 2019-04-11 ENCOUNTER — Encounter: Payer: Self-pay | Admitting: Obstetrics & Gynecology

## 2019-04-11 ENCOUNTER — Inpatient Hospital Stay: Payer: 59 | Attending: Hematology

## 2019-04-11 ENCOUNTER — Encounter: Payer: Self-pay | Admitting: Hematology

## 2019-04-11 ENCOUNTER — Inpatient Hospital Stay (HOSPITAL_BASED_OUTPATIENT_CLINIC_OR_DEPARTMENT_OTHER): Payer: 59 | Admitting: Hematology

## 2019-04-11 ENCOUNTER — Other Ambulatory Visit: Payer: Self-pay

## 2019-04-11 VITALS — BP 122/77 | HR 94 | Temp 97.8°F | Resp 19 | Ht 66.0 in | Wt 219.1 lb

## 2019-04-11 DIAGNOSIS — N920 Excessive and frequent menstruation with regular cycle: Secondary | ICD-10-CM

## 2019-04-11 DIAGNOSIS — D500 Iron deficiency anemia secondary to blood loss (chronic): Secondary | ICD-10-CM

## 2019-04-11 DIAGNOSIS — Z79899 Other long term (current) drug therapy: Secondary | ICD-10-CM | POA: Insufficient documentation

## 2019-04-11 DIAGNOSIS — D72829 Elevated white blood cell count, unspecified: Secondary | ICD-10-CM | POA: Diagnosis not present

## 2019-04-11 DIAGNOSIS — D259 Leiomyoma of uterus, unspecified: Secondary | ICD-10-CM | POA: Insufficient documentation

## 2019-04-11 LAB — CMP (CANCER CENTER ONLY)
ALT: 11 U/L (ref 0–44)
AST: 9 U/L — ABNORMAL LOW (ref 15–41)
Albumin: 4.4 g/dL (ref 3.5–5.0)
Alkaline Phosphatase: 60 U/L (ref 38–126)
Anion gap: 8 (ref 5–15)
BUN: 16 mg/dL (ref 6–20)
CO2: 24 mmol/L (ref 22–32)
Calcium: 9.5 mg/dL (ref 8.9–10.3)
Chloride: 105 mmol/L (ref 98–111)
Creatinine: 0.75 mg/dL (ref 0.44–1.00)
GFR, Est AFR Am: >60 mL/min (ref >60)
GFR, Estimated: >60 mL/min (ref >60)
Glucose, Bld: 98 mg/dL (ref 70–99)
Potassium: 4.3 mmol/L (ref 3.5–5.1)
Sodium: 137 mmol/L (ref 135–145)
Total Bilirubin: 0.6 mg/dL (ref 0.3–1.2)
Total Protein: 7.5 g/dL (ref 6.5–8.1)

## 2019-04-11 LAB — CBC WITH DIFFERENTIAL (CANCER CENTER ONLY)
Abs Immature Granulocytes: 0.03 10*3/uL (ref 0.00–0.07)
Basophils Absolute: 0.1 10*3/uL (ref 0.0–0.1)
Basophils Relative: 1 %
Eosinophils Absolute: 0.2 10*3/uL (ref 0.0–0.5)
Eosinophils Relative: 2 %
HCT: 42.9 % (ref 36.0–46.0)
Hemoglobin: 13.8 g/dL (ref 12.0–15.0)
Immature Granulocytes: 0 %
Lymphocytes Relative: 27 %
Lymphs Abs: 2.9 10*3/uL (ref 0.7–4.0)
MCH: 26.5 pg (ref 26.0–34.0)
MCHC: 32.2 g/dL (ref 30.0–36.0)
MCV: 82.3 fL (ref 80.0–100.0)
Monocytes Absolute: 0.9 10*3/uL (ref 0.1–1.0)
Monocytes Relative: 9 %
Neutro Abs: 6.5 10*3/uL (ref 1.7–7.7)
Neutrophils Relative %: 61 %
Platelet Count: 365 10*3/uL (ref 150–400)
RBC: 5.21 MIL/uL — ABNORMAL HIGH (ref 3.87–5.11)
RDW: 13.5 % (ref 11.5–15.5)
WBC Count: 10.6 10*3/uL — ABNORMAL HIGH (ref 4.0–10.5)
nRBC: 0 % (ref 0.0–0.2)

## 2019-04-11 LAB — SAVE SMEAR(SSMR), FOR PROVIDER SLIDE REVIEW

## 2019-04-11 NOTE — Progress Notes (Signed)
[facility] OFFICE PROGRESS NOTE  Patient Care Team: Vinnie Langton as PCP - General (Physician Assistant)  HEME/ONC OVERVIEW: 1. Iron deficiency anemia secondary to menorrhagia  TREATMENT REGIMEN:  PRN IV iron (Venofer)  ASSESSMENT & PLAN:   Iron deficiency anemia -Secondary to menorrhagia -S/p IV Venofer in 12/2018; unable to tolerate oral iron due to GI side effects -Hgb 13.8 today, stable -Patient reports persistent menorrhagia, but is being scheduled for endometrial ablation -Given the normal Hgb, there is no indication for IV iron infusion -As her Hgb has been stable since 12/2018, we will plan to repeat her CBC and iron profile in 6 months -If she develops any recurrent iron deficiency anemia, then we can see her sooner for IV iron as needed  Menorrhagia -Secondary to uterine fibroids -Followed by gynecology, tentatively is scheduled for endometrial ablation in the next few weeks -I discussed with the patient that with the control of her menstrual bleeding, her hemoglobin should stabilize  Leukocytosis -Most likely secondary to endometritis -WBC 10.6k, slightly elevated -Patient has been taking doxycycline for the past week after the recent endometrial biopsy -Continue antibiotics as prescribed by her gynecologist  Orders Placed This Encounter  Procedures  . CBC with Differential (Cancer Center Only)    Standing Status:   Future    Standing Expiration Date:   05/15/2020  . CMP ([facility] [facility] only)    Standing Status:   Future    Standing Expiration Date:   05/15/2020  . Save Smear (SSMR)    Standing Status:   Future    Standing Expiration Date:   04/10/2020  . Ferritin    Standing Status:   Future    Standing Expiration Date:   05/15/2020  . Iron and TIBC    Standing Status:   Future    Standing Expiration Date:   05/15/2020   The total time spent in the encounter was 32 minutes, including face-to-face time with the patient, review of  various tests results, order additional studies/medications, documentation, and coordination of care plan.   All questions were answered. The patient knows to call the clinic with any problems, questions or concerns. No barriers to learning was detected.  Return in 6 months for labs and clinic follow-up.   Tish Men, MD 3/23/20213:25 PM  CHIEF COMPLAINT: "I am ready for my procedure"  INTERVAL HISTORY: [name] returns clinic for follow-up of iron deficiency anemia secondary to menorrhagia.  Patient reports that she has persistent menorrhagia since last visit, with heavy during the first few days, for which she has to change her pad almost every 2 hours.  She had endometrial biopsy last week that did not show any malignancy, and is being scheduled for endometrial ablation in the next few weeks.  She is also taking doxycycline for endometritis for the past week.  She denies any other symptoms of infection.  REVIEW OF SYSTEMS:   Constitutional: ( - ) fevers, ( - )  chills , ( - ) night sweats Eyes: ( - ) blurriness of vision, ( - ) double vision, ( - ) watery eyes Ears, nose, mouth, throat, and face: ( - ) mucositis, ( - ) sore throat Respiratory: ( - ) cough, ( - ) dyspnea, ( - ) wheezes Cardiovascular: ( - ) palpitation, ( - ) chest discomfort, ( - ) lower extremity swelling Gastrointestinal:  ( - ) nausea, ( - ) heartburn, ( - ) change in bowel habits Skin: ( - ) abnormal  skin rashes Lymphatics: ( - ) new lymphadenopathy, ( - ) easy bruising Neurological: ( - ) numbness, ( - ) tingling, ( - ) new weaknesses Behavioral/Psych: ( - ) mood change, ( - ) new changes  All other systems were reviewed with the patient and are negative.  SUMMARY OF ONCOLOGIC HISTORY: Oncology History   No history exists.    I have reviewed the past medical history, past surgical history, social history and family history with the patient and they are unchanged from previous note.  ALLERGIES:  is allergic  to codeine; iodine; shellfish allergy; and aspirin.  MEDICATIONS:  Current Outpatient Medications  Medication Sig Dispense Refill  . albuterol (VENTOLIN HFA) 108 (90 Base) MCG/ACT inhaler SMARTSIG:2 Puff(s) By Mouth 4 Times Daily    . cetirizine (ZYRTEC) 10 MG tablet Take 1 tablet (10 mg total) by mouth daily. 30 tablet 11  . doxycycline (VIBRAMYCIN) 100 MG capsule Take 1 capsule (100 mg total) by mouth 2 (two) times daily. 20 capsule 0  . fluconazole (DIFLUCAN) 150 MG tablet 1 tab po x 1, repeat in 72 hours 2 tablet 0  . ondansetron (ZOFRAN) 8 MG tablet Take 8 mg by mouth daily.    . Vitamin D, Ergocalciferol, (DRISDOL) 1.25 MG (50000 UT) CAPS capsule     . EPINEPHrine 0.3 mg/0.3 mL IJ SOAJ injection Inject 0.3 mg into the muscle once.     No current facility-administered medications for this visit.    PHYSICAL EXAMINATION: ECOG PERFORMANCE STATUS: 1 - Symptomatic but completely ambulatory  Today's Vitals   04/11/19 1514  BP: 122/77  Pulse: 94  Resp: 19  Temp: 97.8 F (36.6 C)  TempSrc: Temporal  SpO2: 100%  Weight: 219 lb 1.9 oz (99.4 kg)  Height: 5\' 6"  (1.676 m)  PainSc: 0-No pain   Body mass index is 35.37 kg/m.  Filed Weights   04/11/19 1514  Weight: 219 lb 1.9 oz (99.4 kg)    GENERAL: alert, no distress and comfortable SKIN: skin color, texture, turgor are normal, no rashes or significant lesions EYES: conjunctiva are pink and non-injected, sclera clear OROPHARYNX: no exudate, no erythema; lips, buccal mucosa, and tongue normal  NECK: supple, non-tender LYMPH:  no palpable lymphadenopathy in the cervical LUNGS: clear to auscultation with normal breathing effort HEART: regular rate & rhythm and no murmurs and no lower extremity edema ABDOMEN: soft, non-tender, non-distended, normal bowel sounds Musculoskeletal: no cyanosis of digits and no clubbing  PSYCH: alert & oriented x 3, fluent speech NEURO: no focal motor/sensory deficits  LABORATORY DATA:  I have  reviewed the data as listed    Component Value Date/Time   NA 136 01/10/2019 1304   K 4.0 01/10/2019 1304   CL 102 01/10/2019 1304   CO2 25 01/10/2019 1304   GLUCOSE 94 01/10/2019 1304   BUN 17 01/10/2019 1304   CREATININE 0.70 01/10/2019 1304   CALCIUM 9.5 01/10/2019 1304   PROT 7.8 01/10/2019 1304   ALBUMIN 4.6 01/10/2019 1304   AST 11 (L) 01/10/2019 1304   ALT 11 01/10/2019 1304   ALKPHOS 63 01/10/2019 1304   BILITOT 0.6 01/10/2019 1304   GFRNONAA >60 01/10/2019 1304   GFRAA >60 01/10/2019 1304    No results found for: SPEP, UPEP  Lab Results  Component Value Date   WBC 10.6 (H) 04/11/2019   NEUTROABS 6.5 04/11/2019   HGB 13.8 04/11/2019   HCT 42.9 04/11/2019   MCV 82.3 04/11/2019   PLT 365 04/11/2019  Chemistry      Component Value Date/Time   NA 136 01/10/2019 1304   K 4.0 01/10/2019 1304   CL 102 01/10/2019 1304   CO2 25 01/10/2019 1304   BUN 17 01/10/2019 1304   CREATININE 0.70 01/10/2019 1304      Component Value Date/Time   CALCIUM 9.5 01/10/2019 1304   ALKPHOS 63 01/10/2019 1304   AST 11 (L) 01/10/2019 1304   ALT 11 01/10/2019 1304   BILITOT 0.6 01/10/2019 1304       RADIOGRAPHIC STUDIES: I have personally reviewed the radiological images as listed below and agreed with the findings in the report. US PELVIS TRANSVAGINAL NON-OB (TV ONLY)  Result Date: 04/04/2019 SEE PROGRESS NOTES FOR RESULTS

## 2019-04-12 ENCOUNTER — Telehealth: Payer: Self-pay | Admitting: Obstetrics & Gynecology

## 2019-04-12 LAB — FERRITIN: Ferritin: 28 ng/mL (ref 11–307)

## 2019-04-12 LAB — IRON AND TIBC
Iron: 52 ug/dL (ref 41–142)
Saturation Ratios: 17 % — ABNORMAL LOW (ref 21–57)
TIBC: 303 ug/dL (ref 236–444)
UIBC: 250 ug/dL (ref 120–384)

## 2019-04-12 NOTE — Telephone Encounter (Signed)
Patient requesting to speak to [facility] regarding surgery scheduling.

## 2019-04-12 NOTE — Telephone Encounter (Signed)
Spoke with patient regarding surgery benefits. Patient acknowledges understanding of information presented. Patient is aware that benefits presented are for professional benefits only. Patient is aware that once surgery is scheduled, the hospital will call with separate benefits. Patient is aware of surgery cancellation policy. Patient is ready to proceed with scheduling. Call transferred to [facility], [place], for scheduling.

## 2019-04-12 NOTE — Telephone Encounter (Signed)
Spoke with patient. Patient is requesting surgery on Friday 04/28/2019 with Dr.Miller. States that per Dr.Miller okay to perform surgery on a Friday. Surgery requested for 04/28/2019. Advised patient will return call with confirmed date and time. Patient is agreeable.

## 2019-04-12 NOTE — Telephone Encounter (Signed)
Spoke with patient. Surgery scheduled for 04/28/2019 0730 [facility]. COVID test scheduled for 04/26/2019 at 12:25 pm at [facility] location. Patient is aware of the need to quarantine from the time of test until after surgery. 2 week post op scheduled for 05/12/2019 at 8 am with Dr.Miller. Surgery instructions reviewed and will be mailed to patient verified home address on file.

## 2019-04-14 ENCOUNTER — Other Ambulatory Visit: Payer: Self-pay | Admitting: Obstetrics & Gynecology

## 2019-04-14 NOTE — Telephone Encounter (Signed)
Spoke with patient. Surgery date moved to 04/25/2019 at 10 am at [facility]. Patient is agreeable to date and time. COVID test moved to 04/21/2019 at 12:25 pm at [facility]. Patient is aware of the need to quarantine after test until surgery.  Routing to provider and will close encounter.

## 2019-04-14 NOTE — Telephone Encounter (Signed)
Left message to call Betty Leonard at [phone]. 

## 2019-04-14 NOTE — Telephone Encounter (Signed)
Patient returning call to [facility] regarding scheduling surgery.

## 2019-04-18 ENCOUNTER — Telehealth: Payer: Self-pay | Admitting: Obstetrics & Gynecology

## 2019-04-18 ENCOUNTER — Encounter (HOSPITAL_BASED_OUTPATIENT_CLINIC_OR_DEPARTMENT_OTHER): Payer: Self-pay | Admitting: Obstetrics & Gynecology

## 2019-04-18 ENCOUNTER — Other Ambulatory Visit: Payer: Self-pay

## 2019-04-18 NOTE — Progress Notes (Signed)
Spoke w/ via phone for pre-op interview---Betty Leonard needs dos----     Cbc, type and screen, urine preg          COVID test ------04-21-2019 at 125 pm Arrive at -------800 am 04-25-2019 NPO after ------midnight Medications to take morning of surgery -----certrizine Diabetic medication -----n/a Patient Special Instructions -----none Pre-Op special Istructions -----none Patient verbalized understanding of instructions that were given at this phone interview. Patient denies shortness of breath, chest pain, fever, cough a this phone interview.

## 2019-04-18 NOTE — Telephone Encounter (Signed)
Patient asked to talk with [facility]. Patient would like to change the time of her covid test that is required before her upcoming surgery.

## 2019-04-18 NOTE — Telephone Encounter (Signed)
Spoke with patient. Patient would like to move her COVID test time to later in the afternoon on 04/21/2019. COVID test moved to 04/21/2019 at 1:25 pm. Patient is agreeable to date and time.  Routing to provider and will close encounter.

## 2019-04-21 ENCOUNTER — Other Ambulatory Visit (HOSPITAL_COMMUNITY)
Admission: RE | Admit: 2019-04-21 | Discharge: 2019-04-21 | Disposition: A | Discharge status: Home / Self Care | Payer: 59 | Source: Ambulatory Visit | Attending: Obstetrics & Gynecology | Admitting: Obstetrics & Gynecology

## 2019-04-21 ENCOUNTER — Other Ambulatory Visit (HOSPITAL_COMMUNITY): Payer: Self-pay

## 2019-04-21 DIAGNOSIS — Z01812 Encounter for preprocedural laboratory examination: Secondary | ICD-10-CM | POA: Insufficient documentation

## 2019-04-21 DIAGNOSIS — Z20822 Contact with and (suspected) exposure to covid-19: Secondary | ICD-10-CM | POA: Diagnosis not present

## 2019-04-21 LAB — SARS CORONAVIRUS 2 (TAT 6-24 HRS): SARS Coronavirus 2: NEGATIVE

## 2019-04-24 ENCOUNTER — Encounter (HOSPITAL_BASED_OUTPATIENT_CLINIC_OR_DEPARTMENT_OTHER): Payer: Self-pay | Admitting: Obstetrics & Gynecology

## 2019-04-24 NOTE — Anesthesia Preprocedure Evaluation (Addendum)
Anesthesia Evaluation  Patient identified by MRN, date of birth, ID band Patient awake    Reviewed: Allergy & Precautions, NPO status , Patient's Chart, lab work & pertinent test results  History of Anesthesia Complications Negative for: history of anesthetic complications  Airway Mallampati: II  TM Distance: >3 FB Neck ROM: Full    Dental no notable dental hx.    Pulmonary former smoker,    Pulmonary exam normal breath sounds clear to auscultation       Cardiovascular negative cardio ROS Normal cardiovascular exam Rhythm:Regular Rate:Normal     Neuro/Psych  Headaches, Anxiety Depression    GI/Hepatic Neg liver ROS, GERD  ,Esophagitis   Endo/Other  negative endocrine ROS  Renal/GU negative Renal ROS     Musculoskeletal negative musculoskeletal ROS (+)   Abdominal (+) + obese,   Peds  Hematology negative hematology ROS (+)   Anesthesia Other Findings Menorrhagia with irregular cycle, Iron deficiency anemia due to chronic blood loss  Reproductive/Obstetrics menorrhagia                           Anesthesia Physical Anesthesia Plan  ASA: II  Anesthesia Plan: General   Post-op Pain Management:    Induction: Intravenous  PONV Risk Score and Plan: 4 or greater and Treatment may vary due to age or medical condition, Ondansetron, Dexamethasone and Midazolam  Airway Management Planned: LMA  Additional Equipment: None  Intra-op Plan:   Post-operative Plan: Extubation in OR  Informed Consent: I have reviewed the patients History and Physical, chart, labs and discussed the procedure including the risks, benefits and alternatives for the proposed anesthesia with the patient or authorized representative who has indicated his/her understanding and acceptance.     Dental advisory given  Plan Discussed with: CRNA  Anesthesia Plan Comments:       Anesthesia Quick Evaluation

## 2019-04-25 ENCOUNTER — Ambulatory Visit (HOSPITAL_BASED_OUTPATIENT_CLINIC_OR_DEPARTMENT_OTHER): Payer: 59 | Admitting: Anesthesiology

## 2019-04-25 ENCOUNTER — Telehealth: Payer: Self-pay | Admitting: *Deleted

## 2019-04-25 ENCOUNTER — Ambulatory Visit (HOSPITAL_BASED_OUTPATIENT_CLINIC_OR_DEPARTMENT_OTHER)
Admission: RE | Admit: 2019-04-25 | Discharge: 2019-04-25 | Disposition: A | Discharge status: Home / Self Care | Payer: 59 | Attending: Obstetrics & Gynecology | Admitting: Obstetrics & Gynecology

## 2019-04-25 ENCOUNTER — Encounter (HOSPITAL_BASED_OUTPATIENT_CLINIC_OR_DEPARTMENT_OTHER)
Admission: RE | Disposition: A | Discharge status: Home / Self Care | Payer: Self-pay | Source: Home / Self Care | Attending: Obstetrics & Gynecology

## 2019-04-25 ENCOUNTER — Other Ambulatory Visit: Payer: Self-pay

## 2019-04-25 ENCOUNTER — Encounter (HOSPITAL_BASED_OUTPATIENT_CLINIC_OR_DEPARTMENT_OTHER): Payer: Self-pay | Admitting: Obstetrics & Gynecology

## 2019-04-25 ENCOUNTER — Other Ambulatory Visit: Payer: Self-pay | Admitting: Obstetrics & Gynecology

## 2019-04-25 DIAGNOSIS — Z888 Allergy status to other drugs, medicaments and biological substances status: Secondary | ICD-10-CM | POA: Diagnosis not present

## 2019-04-25 DIAGNOSIS — Z885 Allergy status to narcotic agent status: Secondary | ICD-10-CM | POA: Insufficient documentation

## 2019-04-25 DIAGNOSIS — D500 Iron deficiency anemia secondary to blood loss (chronic): Secondary | ICD-10-CM | POA: Insufficient documentation

## 2019-04-25 DIAGNOSIS — Z87892 Personal history of anaphylaxis: Secondary | ICD-10-CM | POA: Insufficient documentation

## 2019-04-25 DIAGNOSIS — Z87891 Personal history of nicotine dependence: Secondary | ICD-10-CM | POA: Insufficient documentation

## 2019-04-25 DIAGNOSIS — Z883 Allergy status to other anti-infective agents status: Secondary | ICD-10-CM | POA: Insufficient documentation

## 2019-04-25 DIAGNOSIS — N921 Excessive and frequent menstruation with irregular cycle: Secondary | ICD-10-CM | POA: Diagnosis not present

## 2019-04-25 DIAGNOSIS — Z91013 Allergy to seafood: Secondary | ICD-10-CM | POA: Insufficient documentation

## 2019-04-25 DIAGNOSIS — Z886 Allergy status to analgesic agent status: Secondary | ICD-10-CM | POA: Diagnosis not present

## 2019-04-25 DIAGNOSIS — Z881 Allergy status to other antibiotic agents status: Secondary | ICD-10-CM | POA: Diagnosis not present

## 2019-04-25 HISTORY — DX: Anemia, unspecified: D64.9

## 2019-04-25 HISTORY — PX: HYSTEROSCOPY WITH NOVASURE: SHX5574

## 2019-04-25 HISTORY — DX: Excessive and frequent menstruation with regular cycle: N92.0

## 2019-04-25 LAB — CBC
HCT: 44.1 % (ref 36.0–46.0)
Hemoglobin: 14.2 g/dL (ref 12.0–15.0)
MCH: 26.8 pg (ref 26.0–34.0)
MCHC: 32.2 g/dL (ref 30.0–36.0)
MCV: 83.4 fL (ref 80.0–100.0)
Platelets: 407 10*3/uL — ABNORMAL HIGH (ref 150–400)
RBC: 5.29 MIL/uL — ABNORMAL HIGH (ref 3.87–5.11)
RDW: 13.3 % (ref 11.5–15.5)
WBC: 7.6 10*3/uL (ref 4.0–10.5)
nRBC: 0 % (ref 0.0–0.2)

## 2019-04-25 LAB — TYPE AND SCREEN
ABO/RH(D): A POS
Antibody Screen: NEGATIVE

## 2019-04-25 LAB — ABO/RH: ABO/RH(D): A POS

## 2019-04-25 LAB — POCT PREGNANCY, URINE: Preg Test, Ur: NEGATIVE

## 2019-04-25 SURGERY — HYSTEROSCOPY WITH NOVASURE
Anesthesia: General | Site: Vagina

## 2019-04-25 MED ORDER — HYDROCODONE-ACETAMINOPHEN 5-325 MG PO TABS: 1.0000 | ORAL_TABLET | Freq: Four times a day (QID) | ORAL | 0 refills | Status: DC | PRN

## 2019-04-25 MED ORDER — DEXAMETHASONE SODIUM PHOSPHATE 10 MG/ML IJ SOLN
INTRAMUSCULAR | Status: AC
  Filled 2019-04-25: qty 1

## 2019-04-25 MED ORDER — LIDOCAINE 2% (20 MG/ML) 5 ML SYRINGE
INTRAMUSCULAR | Status: AC
  Filled 2019-04-25: qty 5

## 2019-04-25 MED ORDER — ONDANSETRON HCL 4 MG/2ML IJ SOLN
INTRAMUSCULAR | Status: AC
  Filled 2019-04-25: qty 2

## 2019-04-25 MED ORDER — FENTANYL CITRATE (PF) 100 MCG/2ML IJ SOLN
INTRAMUSCULAR | Status: DC | PRN
  Administered 2019-04-25: 50 ug via INTRAVENOUS
  Administered 2019-04-25 (×2): 25 ug via INTRAVENOUS

## 2019-04-25 MED ORDER — ACETAMINOPHEN 500 MG PO TABS
1000.0000 mg | ORAL_TABLET | Freq: Once | ORAL | Status: AC
  Administered 2019-04-25: 08:00:00 1000 mg via ORAL
  Filled 2019-04-25: qty 2

## 2019-04-25 MED ORDER — OXYCODONE HCL 5 MG PO TABS
5.0000 mg | ORAL_TABLET | Freq: Once | ORAL | Status: AC | PRN
  Administered 2019-04-25: 5 mg via ORAL
  Filled 2019-04-25: qty 1

## 2019-04-25 MED ORDER — LACTATED RINGERS IV SOLN
INTRAVENOUS | Status: DC
  Filled 2019-04-25: qty 1000

## 2019-04-25 MED ORDER — SODIUM CHLORIDE 0.9 % IR SOLN
Status: DC | PRN
  Administered 2019-04-25: 3000 mL

## 2019-04-25 MED ORDER — MIDAZOLAM HCL 2 MG/2ML IJ SOLN
INTRAMUSCULAR | Status: AC
  Filled 2019-04-25: qty 2

## 2019-04-25 MED ORDER — FENTANYL CITRATE (PF) 100 MCG/2ML IJ SOLN
INTRAMUSCULAR | Status: AC
  Filled 2019-04-25: qty 2

## 2019-04-25 MED ORDER — KETOROLAC TROMETHAMINE 30 MG/ML IJ SOLN
30.0000 mg | Freq: Once | INTRAMUSCULAR | Status: DC | PRN
  Filled 2019-04-25: qty 1

## 2019-04-25 MED ORDER — FAMOTIDINE 20 MG PO TABS
20.0000 mg | ORAL_TABLET | Freq: Once | ORAL | Status: AC
  Administered 2019-04-25: 20 mg via ORAL
  Filled 2019-04-25: qty 1

## 2019-04-25 MED ORDER — LIDOCAINE 2% (20 MG/ML) 5 ML SYRINGE
INTRAMUSCULAR | Status: DC | PRN
  Administered 2019-04-25: 60 mg via INTRAVENOUS

## 2019-04-25 MED ORDER — OXYCODONE-ACETAMINOPHEN 5-325 MG PO TABS: 2.0000 | ORAL_TABLET | ORAL | 0 refills | Status: AC | PRN

## 2019-04-25 MED ORDER — HYDROCODONE-ACETAMINOPHEN 5-325 MG PO TABS: 1.0000 | ORAL_TABLET | Freq: Four times a day (QID) | ORAL | 0 refills | Status: AC | PRN

## 2019-04-25 MED ORDER — KETOROLAC TROMETHAMINE 30 MG/ML IJ SOLN
INTRAMUSCULAR | Status: AC
  Filled 2019-04-25: qty 1

## 2019-04-25 MED ORDER — OXYCODONE HCL 5 MG PO TABS
ORAL_TABLET | ORAL | Status: AC
  Filled 2019-04-25: qty 1

## 2019-04-25 MED ORDER — KETOROLAC TROMETHAMINE 30 MG/ML IJ SOLN
INTRAMUSCULAR | Status: DC | PRN
  Administered 2019-04-25: 30 mg via INTRAVENOUS

## 2019-04-25 MED ORDER — FAMOTIDINE 20 MG PO TABS
ORAL_TABLET | ORAL | Status: AC
  Filled 2019-04-25: qty 1

## 2019-04-25 MED ORDER — ACETAMINOPHEN 500 MG PO TABS
ORAL_TABLET | ORAL | Status: AC
  Filled 2019-04-25: qty 2

## 2019-04-25 MED ORDER — LIDOCAINE-EPINEPHRINE 1 %-1:100000 IJ SOLN
INTRAMUSCULAR | Status: DC | PRN
  Administered 2019-04-25: 10 mL

## 2019-04-25 MED ORDER — IBUPROFEN 800 MG PO TABS: 800.0000 mg | ORAL_TABLET | Freq: Three times a day (TID) | ORAL | 0 refills | Status: DC | PRN

## 2019-04-25 MED ORDER — OXYCODONE HCL 5 MG/5ML PO SOLN
5.0000 mg | Freq: Once | ORAL | Status: AC | PRN
  Filled 2019-04-25: qty 5

## 2019-04-25 MED ORDER — PROMETHAZINE HCL 25 MG/ML IJ SOLN
6.2500 mg | INTRAMUSCULAR | Status: DC | PRN
  Filled 2019-04-25: qty 1

## 2019-04-25 MED ORDER — ONDANSETRON HCL 4 MG/2ML IJ SOLN
INTRAMUSCULAR | Status: DC | PRN
  Administered 2019-04-25: 4 mg via INTRAVENOUS

## 2019-04-25 MED ORDER — PROPOFOL 10 MG/ML IV BOLUS
INTRAVENOUS | Status: DC | PRN
  Administered 2019-04-25: 50 mg via INTRAVENOUS
  Administered 2019-04-25: 200 mg via INTRAVENOUS
  Administered 2019-04-25: 50 mg via INTRAVENOUS

## 2019-04-25 MED ORDER — DEXAMETHASONE SODIUM PHOSPHATE 4 MG/ML IJ SOLN
INTRAMUSCULAR | Status: DC | PRN
  Administered 2019-04-25: 10 mg via INTRAVENOUS

## 2019-04-25 MED ORDER — MIDAZOLAM HCL 5 MG/5ML IJ SOLN
INTRAMUSCULAR | Status: DC | PRN
  Administered 2019-04-25: 2 mg via INTRAVENOUS

## 2019-04-25 MED ORDER — FENTANYL CITRATE (PF) 100 MCG/2ML IJ SOLN
25.0000 ug | INTRAMUSCULAR | Status: DC | PRN
  Administered 2019-04-25: 25 ug via INTRAVENOUS
  Filled 2019-04-25: qty 1

## 2019-04-25 MED ORDER — PROPOFOL 10 MG/ML IV BOLUS
INTRAVENOUS | Status: AC
  Filled 2019-04-25: qty 40

## 2019-04-25 SURGICAL SUPPLY — 20 items
ABLATOR SURESOUND NOVASURE (ABLATOR) ×2 IMPLANT
BRIEF STRETCH FOR OB PAD LRG (UNDERPADS AND DIAPERS) ×2 IMPLANT
CANISTER SUCT 3000ML PPV (MISCELLANEOUS) ×3 IMPLANT
CATH ROBINSON RED A/P 16FR (CATHETERS) ×3 IMPLANT
CNTNR URN SCR LID CUP LEK RST (MISCELLANEOUS) ×2 IMPLANT
CONT SPEC 4OZ STRL OR WHT (MISCELLANEOUS) ×3
COVER WAND RF STERILE (DRAPES) ×3 IMPLANT
DILATOR CANAL MILEX (MISCELLANEOUS) IMPLANT
ELECT REM PT RETURN 9FT ADLT (ELECTROSURGICAL)
ELECTRODE REM PT RTRN 9FT ADLT (ELECTROSURGICAL) IMPLANT
GAUZE 4X4 16PLY RFD (DISPOSABLE) ×3 IMPLANT
GLOVE BIOGEL PI IND STRL 7.0 (GLOVE) ×2 IMPLANT
GLOVE BIOGEL PI INDICATOR 7.0 (GLOVE) ×4
GLOVE ECLIPSE 6.5 STRL STRAW (GLOVE) ×3 IMPLANT
GOWN STRL REUS W/TWL LRG LVL3 (GOWN DISPOSABLE) ×6 IMPLANT
IV NS IRRIG 3000ML ARTHROMATIC (IV SOLUTION) ×3 IMPLANT
KIT PROCEDURE FLUENT (KITS) ×3 IMPLANT
PACK VAGINAL MINOR WOMEN LF (CUSTOM PROCEDURE TRAY) ×3 IMPLANT
PAD OB MATERNITY 4.3X12.25 (PERSONAL CARE ITEMS) ×3 IMPLANT
TOWEL OR 17X26 10 PK STRL BLUE (TOWEL DISPOSABLE) ×4 IMPLANT

## 2019-04-25 NOTE — Anesthesia Postprocedure Evaluation (Signed)
Anesthesia Post Note  Patient: Betty Leonard  Procedure(s) Performed: HYSTEROSCOPY WITH NOVASURE ABLATION (N/A Vagina )     Patient location during evaluation: PACU Anesthesia Type: General Level of consciousness: awake and alert Pain management: pain level controlled Vital Signs Assessment: post-procedure vital signs reviewed and stable Respiratory status: spontaneous breathing, nonlabored ventilation, respiratory function stable and patient connected to nasal cannula oxygen Cardiovascular status: blood pressure returned to baseline and stable Postop Assessment: no apparent nausea or vomiting Anesthetic complications: no    Last Vitals:  Vitals:   04/25/19 1059 04/25/19 1230  BP: 110/77 118/81  Pulse: 76 69  Resp: (!) 21 16  Temp:  36.4 C  SpO2: 96% 97%    Last Pain:  Vitals:   04/25/19 1230  TempSrc:   PainSc: 4                  Ryan P Ellender

## 2019-04-25 NOTE — Discharge Instructions (Addendum)
Post-surgical Instructions, Outpatient Surgery  You may expect to feel dizzy, weak, and drowsy for as long as 24 hours after receiving the medicine that made you sleep (anesthetic). For the first 24 hours after your surgery:    Do not drive a car, ride a bicycle, participate in physical activities, or take public transportation until you are done taking narcotic pain medicines or as directed by Dr. Sabra Heck.   Do not drink alcohol or take tranquilizers.   Do not take medicine that has not been prescribed by your physicians.   Do not sign important papers or make important decisions while on narcotic pain medicines.   Have a responsible person with you.   CARE OF INCISION  If you have a bandage, you may remove it in one day.  If there are steri-strips or dermabond, just let this loosen on its own.   You may shower on the first day after your surgery.  Do not sit in a tub bath for one week.  Avoid heavy lifting (more than 10 pounds/4.5 kilograms), pushing, or pulling.   Avoid activities that may risk injury to your incisions.   PAIN MANAGEMENT  Motrin 800mg .  (This is the same as 4-200mg  over the counter tablets of Motrin or ibuprofen.)  You may take this every eight hours or as needed for cramping.    Vicodin 5/325mg .  For more severe pain, take one or two tablets every four to six hours as needed for pain control.  (Remember that narcotic pain medications increase your risk of constipation.  If this becomes a problem, you may take an over the counter stool softener like Colace 100mg  up to four times a day.)  DO'S AND DON'T'S  Do not take a tub bath for one week.  You may shower on the first day after your surgery  Do not do any heavy lifting for one to two weeks.  This increases the chance of bleeding.  Do move around as you feel able.  Stairs are fine.  You may begin to exercise again as you feel able.  Do not lift any weights for two weeks.  Do not put anything in the vagina for  two weeks--no tampons, intercourse, or douching.    REGULAR MEDIATIONS/VITAMINS:  You may restart all of your regular medications as prescribed.  You may restart all of your vitamins as you normally take them.    PLEASE CALL OR SEEK MEDICAL CARE IF:  You have persistent nausea and vomiting.   You have trouble eating or drinking.   You have an oral temperature above 100.5.   You have constipation that is not helped by adjusting diet or increasing fluid intake. Pain medicines are a common cause of constipation.   You have heavy vaginal bleeding  You have redness or drainage from your incision(s) or there is increasing pain or tenderness near or in the surgical site.     Do not take any Tylenol until after 2:15 pm today.   Post Anesthesia Home Care Instructions  Activity: Get plenty of rest for the remainder of the day. A responsible individual must stay with you for 24 hours following the procedure.  For the next 24 hours, DO NOT: -Drive a car -Paediatric nurse -Drink alcoholic beverages -Take any medication unless instructed by your physician -Make any legal decisions or sign important papers.  Meals: Start with liquid foods such as gelatin or soup. Progress to regular foods as tolerated. Avoid greasy, spicy, heavy foods. If nausea  and/or vomiting occur, drink only clear liquids until the nausea and/or vomiting subsides. Call your physician if vomiting continues.  Special Instructions/Symptoms: Your throat may feel dry or sore from the anesthesia or the breathing tube placed in your throat during surgery. If this causes discomfort, gargle with warm salt water. The discomfort should disappear within 24 hours.  If you had a scopolamine patch placed behind your ear for the management of post- operative nausea and/or vomiting:  1. The medication in the patch is effective for 72 hours, after which it should be removed.  Wrap patch in a tissue and discard in the trash. Wash  hands thoroughly with soap and water. 2. You may remove the patch earlier than 72 hours if you experience unpleasant side effects which may include dry mouth, dizziness or visual disturbances. 3. Avoid touching the patch. Wash your hands with soap and water after contact with the patch.

## 2019-04-25 NOTE — Telephone Encounter (Signed)
Call to [facility], spoke with Jone Baseman, advised per Dr. Sabra Heck to cancel prescription for Hydrocodone. Jone Baseman advised would discontinue from patient's medication list.   Encounter closed.

## 2019-04-25 NOTE — Transfer of Care (Signed)
Immediate Anesthesia Transfer of Care Note  Patient: Betty Leonard  Procedure(s) Performed: Procedure(s) (LRB): HYSTEROSCOPY WITH NOVASURE ABLATION (N/A)  Patient Location: PACU  Anesthesia Type: General  Level of Consciousness: awake, sedated, patient cooperative and responds to stimulation  Airway & Oxygen Therapy: Patient Spontanous Breathing and Patient connected to [place] 02 and soft FM   Post-op Assessment: Report given to PACU RN, Post -op Vital signs reviewed and stable and Patient moving all extremities  Post vital signs: Reviewed and stable  Complications: No apparent anesthesia complications

## 2019-04-25 NOTE — H&P (Signed)
Betty Leonard is an 39 y.o. female G0 here for treatment of menorrhagia resulting in iron deficiency anemia.  She has been followed by hematology as well and has undergone iron infusions.  She most recently had follow up in march and hb was 13.8.  She will continue to be followed after procedure today to ensure this remains stable.  She's undergone ultrasound showing uterus measuring 7.8 x 4.9 x 4.0cm with an 36mm intramural fibroids.  Ovaries are normal.  Endometrial biopsy was negative for abnormal cells obtained 04/04/2019 showing proliferative endometrium.  We have discussed treatment options for bleeding and she has decided to proceed with endometrial ablation.  Hormonal methods, IUD and hysterectomy have also been discussed.  Risks, benefits have also been reviewed.  Pt here and ready to proceed.  Pertinent Gynecological History: Menses: heavy Bleeding: dysfunctional uterine bleeding Contraception: abstinence DES exposure: denies Blood transfusions: none Sexually transmitted diseases: no past history Previous GYN Procedures: colposcopy for abnormal pap smear  Last mammogram: n/a Last pap: normal Date: 09/19/2018 OB History: G0, P0     Past Medical History:  Diagnosis Date  . Anemia    iron infusion sept 2020  . Chicken pox   . Depression   . Esophagitis 12/24/2011  . GERD (gastroesophageal reflux disease)   . Menorrhagia    with irregular cycles  . Migraines   . Pneumonia 07/2018  . Seasonal allergies   . UTI (urinary tract infection) finished antibiotic 04-11-2019    Past Surgical History:  Procedure Laterality Date  . ESOPHAGOGASTRODUODENOSCOPY  12/24/2011   Procedure: ESOPHAGOGASTRODUODENOSCOPY (EGD);  Surgeon: Inda Castle, MD;  Location: [facility];  Service: Endoscopy;  Laterality: N/A;  . FOOT SURGERY Right 2009   repair remotely after a fracture  . FOOT SURGERY Right 1'2020  . KNEE ARTHROSCOPY Left 2018    Family History  Problem Relation Age of Onset  .  Leukemia Paternal Grandfather   . Lung cancer Paternal Grandfather   . Arthritis Paternal Grandfather   . Diabetes Paternal Grandfather   . Heart disease Paternal Grandfather   . Hypertension Paternal Grandfather   . Kidney disease Paternal Grandfather   . Prostate cancer Paternal Grandfather   . Gaucher's disease Paternal Grandfather   . Sudden death Father        age 32, nothing found on autopsy  . Diabetes Father   . Hyperlipidemia Father   . Hypertension Father   . Mental illness Mother        bipolar, schizophrenia  . Alcohol abuse Maternal Grandfather   . Stroke Paternal Grandmother   . Colon cancer Neg Hx     Social History:  reports that she quit smoking about 3 months ago. Her smoking use included cigarettes. She has a 5.00 pack-year smoking history. She has never used smokeless tobacco. She reports current alcohol use of about 2.0 standard drinks of alcohol per week. She reports that she does not use drugs.  Allergies:  Allergies  Allergen Reactions  . Codeine Shortness Of Breath  . Iodine Other (See Comments)    Shellfish allergy  . Shellfish Allergy Anaphylaxis  . Aspirin Hives    Medications Prior to Admission  Medication Sig Dispense Refill Last Dose  . cetirizine (ZYRTEC) 10 MG tablet Take 1 tablet (10 mg total) by mouth daily. 30 tablet 11 04/24/2019 at Unknown time  . naproxen sodium (ALEVE) 220 MG tablet Take 220 mg by mouth daily as needed.     . Vitamin D, Ergocalciferol, (  DRISDOL) 1.25 MG (50000 UT) CAPS capsule    Past Week at Unknown time  . doxycycline (VIBRAMYCIN) 100 MG capsule Take 1 capsule (100 mg total) by mouth 2 (two) times daily. 20 capsule 0 04/11/2019  . EPINEPHrine 0.3 mg/0.3 mL IJ SOAJ injection Inject 0.3 mg into the muscle once.     . fluconazole (DIFLUCAN) 150 MG tablet 1 tab po x 1, repeat in 72 hours 2 tablet 0   . ondansetron (ZOFRAN) 8 MG tablet Take 8 mg by mouth every 8 (eight) hours as needed.    Unknown at Unknown time    Review  of Systems  All other systems reviewed and are negative.   Blood pressure 116/86, pulse 93, temperature 97.9 F (36.6 C), temperature source Oral, resp. rate 18, height 5\' 6"  (1.676 m), weight 102.9 kg, SpO2 98 %. Physical Exam  Constitutional: She is oriented to person, place, and time. She appears well-developed and well-nourished.  Cardiovascular: Normal rate and regular rhythm.  Respiratory: Effort normal and breath sounds normal.  Neurological: She is alert and oriented to person, place, and time.  Skin: Skin is warm and dry.  Psychiatric: She has a normal mood and affect.    Results for orders placed or performed during the hospital encounter of 04/25/19 (from the past 24 hour(s))  Pregnancy, urine POC     Status: None   Collection Time: 04/25/19  7:50 AM  Result Value Ref Range   Preg Test, Ur NEGATIVE NEGATIVE  CBC     Status: Abnormal   Collection Time: 04/25/19  7:55 AM  Result Value Ref Range   WBC 7.6 4.0 - 10.5 K/uL   RBC 5.29 (H) 3.87 - 5.11 MIL/uL   Hemoglobin 14.2 12.0 - 15.0 g/dL   HCT 44.1 36.0 - 46.0 %   MCV 83.4 80.0 - 100.0 fL   MCH 26.8 26.0 - 34.0 pg   MCHC 32.2 30.0 - 36.0 g/dL   RDW 13.3 11.5 - 15.5 %   Platelets 407 (H) 150 - 400 K/uL   nRBC 0.0 0.0 - 0.2 %  Type and screen     Status: None (Preliminary result)   Collection Time: 04/25/19  7:55 AM  Result Value Ref Range   ABO/RH(D) A POS    Antibody Screen PENDING    Sample Expiration      04/28/2019,2359 Performed at [facility], [place] [address] [place], [place] [address]     No results found.  Assessment/Plan: 52 you G0 SWF with hx of menorrhagia resulting in iron deficiency anemia here for treatment with hysteroscopy and endometrial ablation.  Questions answered.  Pt ready to proceed.  Megan Salon 04/25/2019, 8:55 AM

## 2019-04-25 NOTE — Anesthesia Procedure Notes (Signed)
Procedure Name: LMA Insertion Date/Time: 04/25/2019 9:18 AM Performed by: Justice Rocher, CRNA Pre-anesthesia Checklist: Patient identified, Emergency Drugs available, Suction available, Patient being monitored and Timeout performed Patient Re-evaluated:Patient Re-evaluated prior to induction Oxygen Delivery Method: Circle system utilized Preoxygenation: Pre-oxygenation with 100% oxygen Induction Type: IV induction Ventilation: Mask ventilation without difficulty LMA: LMA inserted LMA Size: 4.0 Number of attempts: 1 Airway Equipment and Method: Bite block Placement Confirmation: positive ETCO2,  CO2 detector and breath sounds checked- equal and bilateral Tube secured with: Tape Dental Injury: Teeth and Oropharynx as per pre-operative assessment

## 2019-04-25 NOTE — Op Note (Signed)
04/25/2019  9:46 AM  PATIENT:  Betty Leonard  39 y.o. female  PRE-OPERATIVE DIAGNOSIS:  Menorrhagia with irregular cycle, Iron deficiency anemia due to chronic blood loss, h/o iron infusions  POST-OPERATIVE DIAGNOSIS:  Menorrhagia with irregular cycle, Iron deficiency anemia due to chronic blood loss, h/o iron infusions  PROCEDURE:  Procedure(s): HYSTEROSCOPY WITH NOVASURE ABLATION  SURGEON:  Megan Salon  ASSISTANTS: OR staff  ANESTHESIA:   general  ESTIMATED BLOOD LOSS: 5 mL  BLOOD ADMINISTERED:none   FLUIDS: 700cc LR  UOP: 75cc  SPECIMEN:  none  DISPOSITION OF SPECIMEN:  N/A  FINDINGS: normal appearing endometrial cavity  DESCRIPTION OF OPERATION: Patient was taken to the operating room.  She is placed in the supine position. SCDs were on her lower extremities and functioning properly. General anesthesia with an LMA was administered without difficulty. Dr. Roanna Banning, anesthesia, oversaw case.  Legs were then placed in the [place] in the low lithotomy position. The legs were lifted to the high lithotomy position and the Hibiclens prep was used on the inner thighs perineum and vagina x3. Patient was draped in a normal standard fashion. An in and out catheterization with a red rubber Foley catheter was performed. Approximately 75 cc of clear urine was noted. A bivalve speculum was placed the vagina. The anterior lip of the cervix was grasped with single-tooth tenaculum.  A paracervical block of 1% lidocaine mixed one-to-one with epinephrine (1:100,000 units).  10 cc was used total. The cervix is dilated up to #[facility] dilators. The endometrial cavity sounded to 8.5 cm.   A 2.9 millimeter diagnostic hysteroscope was obtained. Normal saline was used as a hysteroscopic fluid. The hysteroscope was advanced through the endocervical canal into the endometrial cavity. The tubal ostia were noted bilaterally. Additional findings included active bleeding as she was on her menses.   The hysteroscope was removed.  The cavity measuring device was used to measure the endometrial cavity which was 4cm.  The cervical length was 4.5cm.  Novasure device was passed to the fundus and opened.  The device was seated and rotated 90 degrees to the right and left, moved up and down to allow the array to fully open.  The cavity width was 4.0.  Power setting was 88.  Cavity assessment test was then performed and passed without difficulty.  The ablation cycle was initiated and lasted 1 min 36 seconds.  Once the ablation was completed, the array was closed and the device was removed.  Then the hysteroscope was used to visualzie the endometrial cavity where adequate ablation appeared to have been performed.  Photo documentation before and after the ablation was obtained.  The hysteroscope was removed. The fluid deficit was 220 cc. The tenaculum was removed from the anterior lip of the cervix. The speculum was removed from the vagina. The prep was cleansed of the patient's skin. The legs are positioned back in the supine position. Sponge, lap, needle, initially counts were correct x2. Patient was taken to recovery in stable condition.  COUNTS:  YES  PLAN OF CARE: Transfer to PACU

## 2019-04-26 ENCOUNTER — Other Ambulatory Visit (HOSPITAL_COMMUNITY): Payer: Self-pay

## 2019-05-01 ENCOUNTER — Telehealth: Payer: Self-pay | Admitting: Obstetrics & Gynecology

## 2019-05-01 NOTE — Telephone Encounter (Signed)
Patient is having extreme hot flashes after having her procedure last week.

## 2019-05-01 NOTE — Telephone Encounter (Signed)
Pt had hysteroscopy with Novasure 04/25/19 with Dr Sabra Heck.  H/o +colpo with LGSIL, +HR HPV in 04/2017. Negative for both on repeat pap on 09/19/2018.   Spoke with pt. Pt reports having "hot flashes" that started on 04/26/2019 after procedure. Wasn't sure this was normal and called today. Pt also states having night sweats that when she wakes up, she is "soaking wet"  Pt denies bleeding or cramping. Pt wanting to know if normal and what is next steps in plan of care. Pt advised OV to discuss with Dr Sabra Heck. Pt agreeable. Pt scheduled for 05/03/19 at 8:30 am with Dr Sabra Heck. Pt verbalized understanding of date and time.   Routing to Dr Sabra Heck for review and any additional recommendations.

## 2019-05-02 ENCOUNTER — Encounter: Payer: Self-pay | Admitting: Obstetrics & Gynecology

## 2019-05-02 ENCOUNTER — Ambulatory Visit: Payer: 59

## 2019-05-02 NOTE — Telephone Encounter (Signed)
Can you make sure she doesn't have a fever?  I will do some blood work tomorrow to check hormones but I want to make sure this is not fever/chills and just hot flashes.  Thanks.

## 2019-05-02 NOTE — Telephone Encounter (Signed)
Call placed to pt. Pt denies fever and chills. Pt to keep appt scheduled for 05/03/2019. Pt agreeable and verbalized understanding.   Routing to Dr Sabra Heck.

## 2019-05-02 NOTE — Telephone Encounter (Signed)
Thanks.  Ok to close encounter. 

## 2019-05-02 NOTE — Progress Notes (Signed)
GYNECOLOGY  VISIT  CC:   Patient complains of having severe hot flashes with night sweats.  HPI: 39 y.o. G0P0000 Single White or Caucasian female here for vasomotor sx after hysteroscopy.  Pt did have an episode of arm numbness that occurred the evening after her surgery.  She did not have post op pain.  She's had minimal vaginal discharge. She recently saw Dr. Darron Doom.  Vit D was 7 so this was increased to 50k twice weekly.  She did also have some additional blood work on Monday with Dr. Darron Doom.  She does not have any results yet.  She will let me know these results once they are back.  Pt aware I've never had a patient after endometrial ablation with acute onset of menopause but with symptoms, we should evaluate.  Did review pictures from surgery.  Questions answered.  GYNECOLOGIC HISTORY: Patient's last menstrual period was 04/23/2019. Contraception: abstinence Menopausal hormone therapy: none  Patient Active Problem List   Diagnosis Date Noted  . Iron deficiency anemia due to chronic blood loss 10/07/2018  . Menorrhagia 10/07/2018  . History of wheezing 06/06/2018  . Adverse food reaction 06/06/2018  . Other allergic rhinitis 06/06/2018  . Hair loss 06/06/2018  . History of frequent URI 06/06/2018  . Anxiety and depression 09/23/2012  . Hx of migraine headaches 09/23/2012  . Tobacco use disorder 09/23/2012  . Nausea with vomiting, chronic - followed by Dr. Deatra Ina and GI 12/16/2011    Past Medical History:  Diagnosis Date  . Anemia    iron infusion sept 2020  . Chicken pox   . Depression   . Esophagitis 12/24/2011  . GERD (gastroesophageal reflux disease)   . Menorrhagia    with irregular cycles  . Migraines   . Pneumonia 07/2018  . Seasonal allergies   . UTI (urinary tract infection) finished antibiotic 04-11-2019    Past Surgical History:  Procedure Laterality Date  . ESOPHAGOGASTRODUODENOSCOPY  12/24/2011   Procedure: ESOPHAGOGASTRODUODENOSCOPY (EGD);  Surgeon:  Inda Castle, MD;  Location: [facility];  Service: Endoscopy;  Laterality: N/A;  . FOOT SURGERY Right 2009   repair remotely after a fracture  . FOOT SURGERY Right 1'2020  . HYSTEROSCOPY WITH NOVASURE N/A 04/25/2019   Procedure: HYSTEROSCOPY WITH NOVASURE ABLATION;  Surgeon: Megan Salon, MD;  Location: [facility];  Service: Gynecology;  Laterality: N/A;  . KNEE ARTHROSCOPY Left 2018    MEDS:   Current Outpatient Medications on File Prior to Visit  Medication Sig Dispense Refill  . cetirizine (ZYRTEC) 10 MG tablet Take 1 tablet (10 mg total) by mouth daily. 30 tablet 11  . EPINEPHrine 0.3 mg/0.3 mL IJ SOAJ injection Inject 0.3 mg into the muscle once.    Marland Kitchen ibuprofen (ADVIL) 800 MG tablet Take 1 tablet (800 mg total) by mouth every 8 (eight) hours as needed. 30 tablet 0  . naproxen sodium (ALEVE) 220 MG tablet Take 220 mg by mouth daily as needed.    . ondansetron (ZOFRAN) 8 MG tablet Take 8 mg by mouth every 8 (eight) hours as needed.     . Vitamin D, Ergocalciferol, (DRISDOL) 1.25 MG (50000 UT) CAPS capsule Take 50,000 Units by mouth 2 (two) times a week.      No current facility-administered medications on file prior to visit.    ALLERGIES: Codeine, Hydrocodone, Iodine, Shellfish allergy, and Aspirin  Family History  Problem Relation Age of Onset  . Leukemia Paternal Grandfather   . Lung cancer Paternal Grandfather   .  Arthritis Paternal Grandfather   . Diabetes Paternal Grandfather   . Heart disease Paternal Grandfather   . Hypertension Paternal Grandfather   . Kidney disease Paternal Grandfather   . Prostate cancer Paternal Grandfather   . Gaucher's disease Paternal Grandfather   . Sudden death Father        age 73, nothing found on autopsy  . Diabetes Father   . Hyperlipidemia Father   . Hypertension Father   . Mental illness Mother        bipolar, schizophrenia  . Alcohol abuse Maternal Grandfather   . Stroke Paternal Grandmother   . Colon cancer  Neg Hx     SH:  Single, non smoker  Review of Systems  Constitutional:       Hot flashes  Night sweats   All other systems reviewed and are negative.   PHYSICAL EXAMINATION:    BP 118/70 (BP Location: Right Arm, Patient Position: Sitting, Cuff Size: Large)   Pulse 88   Temp 98.1 F (36.7 C) (Temporal)   Ht 5\' 6"  (1.676 m)   Wt 226 lb (102.5 kg)   LMP 04/23/2019   BMI 36.48 kg/m     General appearance: alert, cooperative and appears stated age Neck: no adenopathy, supple, symmetrical, trachea midline and thyroid normal to inspection and palpation CV:  Regular rate and rhythm Lungs:  clear to auscultation, no wheezes, rales or rhonchi, symmetric air entry  Assessment: Hot flashes after endometrial ablation  Plan: FSH and estradiol levels will be obtained today. Pt will get me copies of lab work from Dr. Darron Doom once this is back (hopefully later this week)

## 2019-05-03 ENCOUNTER — Other Ambulatory Visit: Payer: Self-pay

## 2019-05-03 ENCOUNTER — Ambulatory Visit (INDEPENDENT_AMBULATORY_CARE_PROVIDER_SITE_OTHER): Payer: 59 | Admitting: Obstetrics & Gynecology

## 2019-05-03 ENCOUNTER — Encounter: Payer: Self-pay | Admitting: Obstetrics & Gynecology

## 2019-05-03 VITALS — BP 118/70 | HR 88 | Temp 98.1°F | Ht 66.0 in | Wt 226.0 lb

## 2019-05-03 DIAGNOSIS — R232 Flushing: Secondary | ICD-10-CM | POA: Diagnosis not present

## 2019-05-04 ENCOUNTER — Encounter: Payer: Self-pay | Admitting: Obstetrics & Gynecology

## 2019-05-04 ENCOUNTER — Telehealth: Payer: Self-pay | Admitting: Obstetrics & Gynecology

## 2019-05-04 LAB — CBC
Hematocrit: 42.5 % (ref 34.0–46.6)
Hemoglobin: 14.1 g/dL (ref 11.1–15.9)
MCH: 27 pg (ref 26.6–33.0)
MCHC: 33.2 g/dL (ref 31.5–35.7)
MCV: 81 fL (ref 79–97)
Platelets: 375 10*3/uL (ref 150–450)
RBC: 5.22 x10E6/uL (ref 3.77–5.28)
RDW: 13 % (ref 11.7–15.4)
WBC: 7.7 10*3/uL (ref 3.4–10.8)

## 2019-05-04 LAB — ESTRADIOL: Estradiol: 179 pg/mL

## 2019-05-04 LAB — FOLLICLE STIMULATING HORMONE: FSH: 14.2 m[IU]/mL

## 2019-05-04 NOTE — Telephone Encounter (Signed)
Non-Urgent Medical Question Received: Today Message Contents  Betty, Leonard sent to [place]  Phone Number: [phone]  Hi Dr. Sabra Heck,   Attached are the screen shots you asked me for when my labs came back from my Primary that I had done on Monday.   A1C is down to 5.4, I ran out of attachment space.    Thanks,  Santiago Glad

## 2019-05-04 NOTE — Telephone Encounter (Signed)
Patient was seen in office on 05/03/19.   Copy of PCP labs printed and to Dr. Sabra Heck to review.

## 2019-05-05 NOTE — Telephone Encounter (Signed)
Thank you.  Ok to close encounter. 

## 2019-05-05 NOTE — Progress Notes (Signed)
GYNECOLOGY  VISIT  CC:   Follow up for hot flashes  HPI: 39 y.o. G0P0000 Single White or Caucasian female here for recheck.  She had her second Covid vaccine 05/08/2019.  She woke up the morning after and had body aches for about 14 hours and then it all went away.  Reports hot flashes have improved over the past two weeks.  She and I discussed options for treatment.  Hot flashes started after endometrial ablation.  Denies vaginal bleeding.  Recent blood work with Dr. Darron Doom was normal.  Lab work was sent by pt and is in [employer].    GYNECOLOGIC HISTORY: Patient's last menstrual period was 04/23/2019. Contraception: abstinence  Menopausal hormone therapy: none  Patient Active Problem List   Diagnosis Date Noted  . Iron deficiency anemia due to chronic blood loss 10/07/2018  . Menorrhagia 10/07/2018  . History of wheezing 06/06/2018  . Adverse food reaction 06/06/2018  . Other allergic rhinitis 06/06/2018  . Hair loss 06/06/2018  . History of frequent URI 06/06/2018  . Anxiety and depression 09/23/2012  . Hx of migraine headaches 09/23/2012  . Tobacco use disorder 09/23/2012  . Nausea with vomiting, chronic - followed by Dr. Deatra Ina and GI 12/16/2011    Past Medical History:  Diagnosis Date  . Anemia    iron infusion sept 2020  . Chicken pox   . Depression   . Esophagitis 12/24/2011  . GERD (gastroesophageal reflux disease)   . Menorrhagia    with irregular cycles  . Migraines   . Pneumonia 07/2018  . Seasonal allergies   . UTI (urinary tract infection) finished antibiotic 04-11-2019    Past Surgical History:  Procedure Laterality Date  . ESOPHAGOGASTRODUODENOSCOPY  12/24/2011   Procedure: ESOPHAGOGASTRODUODENOSCOPY (EGD);  Surgeon: Inda Castle, MD;  Location: [facility];  Service: Endoscopy;  Laterality: N/A;  . FOOT SURGERY Right 2009   repair remotely after a fracture  . FOOT SURGERY Right 1'2020  . HYSTEROSCOPY WITH NOVASURE N/A 04/25/2019   Procedure: HYSTEROSCOPY WITH NOVASURE ABLATION;  Surgeon: Megan Salon, MD;  [facility];  Service: Gynecology;  Laterality: N/A;  . KNEE ARTHROSCOPY Left 2018    MEDS:   Current Outpatient Medications on File Prior to Visit  Medication Sig Dispense Refill  . fexofenadine (ALLEGRA) 180 MG tablet Take 180 mg by mouth daily.    Marland Kitchen ibuprofen (ADVIL) 800 MG tablet Take 1 tablet (800 mg total) by mouth every 8 (eight) hours as needed. 30 tablet 0  . Vitamin D, Ergocalciferol, (DRISDOL) 1.25 MG (50000 UT) CAPS capsule Take 50,000 Units by mouth 2 (two) times a week.     Marland Kitchen EPINEPHrine 0.3 mg/0.3 mL IJ SOAJ injection Inject 0.3 mg into the muscle once.     No current facility-administered medications on file prior to visit.    ALLERGIES: Codeine, Hydrocodone, Iodine, Shellfish allergy, and Aspirin  Family History  Problem Relation Age of Onset  . Leukemia Paternal Grandfather   . Lung cancer Paternal Grandfather   . Arthritis Paternal Grandfather   . Diabetes Paternal Grandfather   . Heart disease Paternal Grandfather   . Hypertension Paternal Grandfather   . Kidney disease Paternal Grandfather   . Prostate cancer Paternal Grandfather   . Gaucher's disease Paternal Grandfather   . Sudden death Father        age 33, nothing found on autopsy  . Diabetes Father   . Hyperlipidemia Father   . Hypertension Father   .  Mental illness Mother        bipolar, schizophrenia  . Alcohol abuse Maternal Grandfather   . Stroke Paternal Grandmother   . Colon cancer Neg Hx     SH:  Abstinence   Review of Systems  Constitutional: Negative.   HENT: Negative.   Eyes: Negative.   Respiratory: Negative.   Cardiovascular: Negative.   Gastrointestinal: Negative.   Endocrine: Negative.   Genitourinary: Negative.   Musculoskeletal: Negative.   Skin: Negative.   Allergic/Immunologic: Negative.   Neurological: Negative.   Psychiatric/Behavioral: Negative.     PHYSICAL  EXAMINATION:    BP 112/70   Pulse 70   Temp 97.7 F (36.5 C) (Skin)   Resp 16   Wt 227 lb (103 kg)   LMP 04/23/2019   BMI 36.64 kg/m     General appearance: alert, cooperative and appears stated age  Assessment: Hot flashes after endometrial ablation with normal hormonal testing, normal HbA1C and thyroid testing  Plan: She will start Estrogen to see if this helps.  She will give update in a few weeks.

## 2019-05-08 ENCOUNTER — Ambulatory Visit: Payer: 59 | Attending: Internal Medicine

## 2019-05-08 DIAGNOSIS — Z23 Encounter for immunization: Secondary | ICD-10-CM

## 2019-05-08 NOTE — Progress Notes (Signed)
   Covid-19 Vaccination Clinic  Name:  Betty Leonard    MRN: [id number] DOB: [birth year]  05/08/2019  [name] was observed post Covid-19 immunization for 15 minutes without incident. She was provided with Vaccine Information Sheet and instruction to access the V-Safe system.   [name] was instructed to call 911 with any severe reactions post vaccine: Marland Kitchen Difficulty breathing  . Swelling of face and throat  . A fast heartbeat  . A bad rash all over body  . Dizziness and weakness   Immunizations Administered    Name Date Dose VIS Date Route   Pfizer COVID-19 Vaccine 05/08/2019  4:28 PM 0.3 mL 03/15/2018 Intramuscular   Manufacturer: [place]   Lot: [id number]   [place]: [id number]

## 2019-05-10 ENCOUNTER — Other Ambulatory Visit: Payer: Self-pay

## 2019-05-12 ENCOUNTER — Encounter: Payer: Self-pay | Admitting: Obstetrics & Gynecology

## 2019-05-12 ENCOUNTER — Ambulatory Visit (INDEPENDENT_AMBULATORY_CARE_PROVIDER_SITE_OTHER): Payer: 59 | Admitting: Obstetrics & Gynecology

## 2019-05-12 ENCOUNTER — Other Ambulatory Visit: Payer: Self-pay

## 2019-05-12 VITALS — BP 112/70 | HR 70 | Temp 97.7°F | Resp 16 | Wt 227.0 lb

## 2019-05-12 DIAGNOSIS — R232 Flushing: Secondary | ICD-10-CM | POA: Diagnosis not present

## 2019-05-23 ENCOUNTER — Telehealth: Payer: Self-pay | Admitting: Obstetrics & Gynecology

## 2019-05-23 NOTE — Telephone Encounter (Signed)
Patient is calling regarding "worst cramps in her life" after ablation. Patient stated that cycle started yesterday and "is not normal." Patient stated that the blood is brown.

## 2019-05-23 NOTE — Telephone Encounter (Signed)
Spoke with pt. Pt given recommendations and update per Dr Sabra Heck. Pt agreeable and verbalized understanding. Pt states Ibuprofen and Tylenol together has helped some from taking earlier. Pt advised for  ER precautions for heavy bleeding. Pt agreeable. Pt to calendar cycles and sx and give update in 3 months. Pt agreeable.    Routing to Dr Sabra Heck for review.  Encounter closed.

## 2019-05-23 NOTE — Telephone Encounter (Signed)
There isn't really a "normal" the first three months.  There can be irregular bleeding.  There can be cramping.  The uterus is still healing from the procedure.  Some people have nothing and some people have a lot of irregular bleeding and cramping.  After 3 months, then we really know where a woman is going to be going forward.

## 2019-05-23 NOTE — Telephone Encounter (Signed)
Ablation 04/25/19 LMP 04/23/19  Spoke with pt. Pt reports having severe cramps with brown bleeding x 1 day. Pt states cramps rates on pain scale as 6-7. Pt took Aleve yesterday only. Denies any OTC meds today or any post op meds. Pt states had "problem with post op meds" due to feeling numb all over and ran a high fever, so will not take these any longer. Pt advised can take 1 gm Tylenol with 400 mg Ibuprofen every 4 hours as needed to control pain.  Pt agreeable.  Pt states only changing a pad 4 times a day that is not full with brown bleeding. Denies fever, chills, clots or heavy red bleeding.   Pt wanting to know of what is normal and expected after ablation for "period cramps and bleeding". Advised will review with Dr Sabra Heck and return call to pt. Pt agreeable.   Routing to Dr Sabra Heck

## 2019-05-29 ENCOUNTER — Other Ambulatory Visit: Payer: Self-pay | Admitting: Obstetrics & Gynecology

## 2019-06-15 ENCOUNTER — Other Ambulatory Visit: Payer: Self-pay

## 2019-06-15 ENCOUNTER — Encounter: Payer: Self-pay | Admitting: Family

## 2019-06-15 ENCOUNTER — Inpatient Hospital Stay: Payer: 59

## 2019-06-15 ENCOUNTER — Inpatient Hospital Stay: Payer: 59 | Attending: Hematology | Admitting: Family

## 2019-06-15 VITALS — BP 112/71 | HR 83 | Temp 97.6°F | Resp 18 | Ht 66.0 in | Wt 225.8 lb

## 2019-06-15 DIAGNOSIS — D500 Iron deficiency anemia secondary to blood loss (chronic): Secondary | ICD-10-CM

## 2019-06-15 DIAGNOSIS — N920 Excessive and frequent menstruation with regular cycle: Secondary | ICD-10-CM | POA: Insufficient documentation

## 2019-06-15 DIAGNOSIS — N809 Endometriosis, unspecified: Secondary | ICD-10-CM | POA: Insufficient documentation

## 2019-06-15 DIAGNOSIS — D72829 Elevated white blood cell count, unspecified: Secondary | ICD-10-CM | POA: Insufficient documentation

## 2019-06-15 LAB — CBC WITH DIFFERENTIAL (CANCER CENTER ONLY)
Abs Immature Granulocytes: 0.03 10*3/uL (ref 0.00–0.07)
Basophils Absolute: 0.1 10*3/uL (ref 0.0–0.1)
Basophils Relative: 1 %
Eosinophils Absolute: 0.1 10*3/uL (ref 0.0–0.5)
Eosinophils Relative: 1 %
HCT: 40.3 % (ref 36.0–46.0)
Hemoglobin: 13 g/dL (ref 12.0–15.0)
Immature Granulocytes: 0 %
Lymphocytes Relative: 24 %
Lymphs Abs: 2 10*3/uL (ref 0.7–4.0)
MCH: 26.5 pg (ref 26.0–34.0)
MCHC: 32.3 g/dL (ref 30.0–36.0)
MCV: 82.1 fL (ref 80.0–100.0)
Monocytes Absolute: 0.5 10*3/uL (ref 0.1–1.0)
Monocytes Relative: 6 %
Neutro Abs: 5.7 10*3/uL (ref 1.7–7.7)
Neutrophils Relative %: 68 %
Platelet Count: 365 10*3/uL (ref 150–400)
RBC: 4.91 MIL/uL (ref 3.87–5.11)
RDW: 13.7 % (ref 11.5–15.5)
WBC Count: 8.4 10*3/uL (ref 4.0–10.5)
nRBC: 0 % (ref 0.0–0.2)

## 2019-06-15 LAB — CMP (CANCER CENTER ONLY)
ALT: 9 U/L (ref 0–44)
AST: 10 U/L — ABNORMAL LOW (ref 15–41)
Albumin: 4.3 g/dL (ref 3.5–5.0)
Alkaline Phosphatase: 53 U/L (ref 38–126)
Anion gap: 9 (ref 5–15)
BUN: 11 mg/dL (ref 6–20)
CO2: 24 mmol/L (ref 22–32)
Calcium: 9.3 mg/dL (ref 8.9–10.3)
Chloride: 106 mmol/L (ref 98–111)
Creatinine: 0.65 mg/dL (ref 0.44–1.00)
GFR, Est AFR Am: >60 mL/min (ref >60)
GFR, Estimated: >60 mL/min (ref >60)
Glucose, Bld: 125 mg/dL — ABNORMAL HIGH (ref 70–99)
Potassium: 3.8 mmol/L (ref 3.5–5.1)
Sodium: 139 mmol/L (ref 135–145)
Total Bilirubin: 0.5 mg/dL (ref 0.3–1.2)
Total Protein: 7.2 g/dL (ref 6.5–8.1)

## 2019-06-15 LAB — SAVE SMEAR(SSMR), FOR PROVIDER SLIDE REVIEW

## 2019-06-15 NOTE — Progress Notes (Signed)
Hematology and Oncology Follow Up Visit  Betty Leonard [id number] Oct 07, 1980 39 y.o. 06/15/2019   Principle Diagnosis:  Iron deficiency anemia secondary to heavy cycles Mild leukocytosis - reactive, endometriosis    Interim History:  Betty Leonard is here today for follow-up. She is symptomatic with fatigue, bruising easily, persistent headache x 5 days and chills.  She had a successful uterine ablation on 04/25/2019 and has not had a cycle since.  No blood loss noted. No petechiae.  No fever, n/v, cough, rash, dizziness, SOB, chest pain, palpitations, abdominal pain or changes in bowel or bladder habits.  No swelling or tenderness in her extremities at this time.  She has occasional numbness and tingling in her hands secondary to carpal tunnel syndrome.  No falls or syncopal episodes to report.  Her appetite comes and goes but she does feel that she is hydrating well. Her weight is stable.   ECOG Performance Status: 1 - Symptomatic but completely ambulatory  Medications:  Allergies as of 06/15/2019      Reactions   Codeine Shortness Of Breath   Hydrocodone Shortness Of Breath   Iodine Other (See Comments)   Shellfish allergy   Shellfish Allergy Anaphylaxis   Aspirin Hives      Medication List       Accurate as of Jun 15, 2019  2:59 PM. If you have any questions, ask your nurse or doctor.        EPINEPHrine 0.3 mg/0.3 mL Soaj injection Commonly known as: EPI-PEN Inject 0.3 mg into the muscle once.   fexofenadine 180 MG tablet Commonly known as: ALLEGRA Take 180 mg by mouth daily.   ibuprofen 800 MG tablet Commonly known as: ADVIL Take 1 tablet (800 mg total) by mouth every 8 (eight) hours as needed.   Vitamin D (Ergocalciferol) 1.25 MG (50000 UNIT) Caps capsule Commonly known as: DRISDOL Take 50,000 Units by mouth 2 (two) times a week.       Allergies:  Allergies  Allergen Reactions  . Codeine Shortness Of Breath  . Hydrocodone Shortness Of Breath  . Iodine  Other (See Comments)    Shellfish allergy  . Shellfish Allergy Anaphylaxis  . Aspirin Hives    Past Medical History, Surgical history, Social history, and Family History were reviewed and updated.  Review of Systems: All other 10 point review of systems is negative.   Physical Exam:  vitals were not taken for this visit.   Wt Readings from Last 3 Encounters:  05/12/19 227 lb (103 kg)  05/03/19 226 lb (102.5 kg)  04/25/19 226 lb 14.4 oz (102.9 kg)    Ocular: Sclerae unicteric, pupils equal, round and reactive to light Ear-nose-throat: Oropharynx clear, dentition fair Lymphatic: No cervical or supraclavicular adenopathy Lungs no rales or rhonchi, good excursion bilaterally Heart regular rate and rhythm, no murmur appreciated Abd soft, nontender, positive bowel sounds, no liver or spleen tip palpated on exam, no fluid wave  MSK no focal spinal tenderness, no joint edema Neuro: non-focal, well-oriented, appropriate affect Breasts: Deferred   Lab Results  Component Value Date   WBC 8.4 06/15/2019   HGB 13.0 06/15/2019   HCT 40.3 06/15/2019   MCV 82.1 06/15/2019   PLT 365 06/15/2019   Lab Results  Component Value Date   FERRITIN 28 04/11/2019   IRON 52 04/11/2019   TIBC 303 04/11/2019   UIBC 250 04/11/2019   IRONPCTSAT 17 (L) 04/11/2019   Lab Results  Component Value Date   RBC 4.91 06/15/2019  No results found for: Nils Pyle [facility] Lab Results  Component Value Date   IGGSERUM 1,067 06/06/2018   IGMSERUM 244 (H) 06/06/2018   No results found for: Ronnald Ramp, A1GS, A2GS, Tillman Sers, SPEI   Chemistry      Component Value Date/Time   NA 137 04/11/2019 1450   K 4.3 04/11/2019 1450   CL 105 04/11/2019 1450   CO2 24 04/11/2019 1450   BUN 16 04/11/2019 1450   CREATININE 0.75 04/11/2019 1450      Component Value Date/Time   CALCIUM 9.5 04/11/2019 1450   ALKPHOS 60 04/11/2019 1450   AST 9 (L) 04/11/2019 1450    ALT 11 04/11/2019 1450   BILITOT 0.6 04/11/2019 1450       Impression and Plan: Betty Leonard is a very pleasant 39 yo caucasian female with history of iron deficiency anemia secondary to heavy cycles and mild leukocytosis felt to be reactive with endometriosis.  We will see what her iron studies show and bring her back in for infusion if needed.  We will plan to see her back in another 4 months.  She will contact our office with any questions or concerns. We can certainly see her sooner if needed.   Laverna Peace, NP 5/27/20212:59 PM

## 2019-06-16 LAB — IRON AND TIBC
Iron: 42 ug/dL (ref 41–142)
Saturation Ratios: 15 % — ABNORMAL LOW (ref 21–57)
TIBC: 286 ug/dL (ref 236–444)
UIBC: 244 ug/dL (ref 120–384)

## 2019-06-16 LAB — FERRITIN: Ferritin: 36 ng/mL (ref 11–307)

## 2019-06-20 ENCOUNTER — Other Ambulatory Visit: Payer: Self-pay | Admitting: Family

## 2019-06-23 ENCOUNTER — Inpatient Hospital Stay: Payer: 59 | Attending: Hematology & Oncology

## 2019-06-23 ENCOUNTER — Other Ambulatory Visit: Payer: Self-pay

## 2019-06-23 VITALS — BP 120/65 | HR 73 | Temp 97.8°F | Resp 20

## 2019-06-23 DIAGNOSIS — N920 Excessive and frequent menstruation with regular cycle: Secondary | ICD-10-CM | POA: Insufficient documentation

## 2019-06-23 DIAGNOSIS — D500 Iron deficiency anemia secondary to blood loss (chronic): Secondary | ICD-10-CM | POA: Diagnosis not present

## 2019-06-23 MED ORDER — SODIUM CHLORIDE 0.9 % IV SOLN
200.0000 mg | Freq: Once | INTRAVENOUS | Status: AC
  Administered 2019-06-23: 200 mg via INTRAVENOUS
  Filled 2019-06-23: qty 18.18

## 2019-06-23 MED ORDER — SODIUM CHLORIDE 0.9 % IV SOLN
Freq: Once | INTRAVENOUS | Status: AC
  Filled 2019-06-23: qty 250

## 2019-06-23 NOTE — Progress Notes (Signed)
Discussed w/ Amy S, RN that ok for Venofer to infuse over 440 mL/hr rather than 40 mL/hr.  Kennith Center, Pharm.D., CPP 06/23/2019@2 :38 PM

## 2019-06-23 NOTE — Patient Instructions (Signed)

## 2019-06-30 ENCOUNTER — Inpatient Hospital Stay: Payer: 59

## 2019-06-30 ENCOUNTER — Other Ambulatory Visit: Payer: Self-pay

## 2019-06-30 VITALS — BP 118/62 | HR 73 | Temp 98.7°F | Resp 17

## 2019-06-30 DIAGNOSIS — D500 Iron deficiency anemia secondary to blood loss (chronic): Secondary | ICD-10-CM

## 2019-06-30 MED ORDER — SODIUM CHLORIDE 0.9 % IV SOLN
Freq: Once | INTRAVENOUS | Status: AC
  Filled 2019-06-30: qty 250

## 2019-06-30 MED ORDER — SODIUM CHLORIDE 0.9 % IV SOLN
200.0000 mg | Freq: Once | INTRAVENOUS | Status: AC
  Administered 2019-06-30: 200 mg via INTRAVENOUS
  Filled 2019-06-30: qty 10

## 2019-06-30 NOTE — Progress Notes (Signed)
Pt declined to stay for 30 minute post infusion observation period. Pt stated she has tolerated medication without difficulty multiple times. Pt aware to call the clinic with any questions or concerns. Pt verbalized understanding and had no further questions.

## 2019-06-30 NOTE — Patient Instructions (Signed)

## 2019-07-07 ENCOUNTER — Inpatient Hospital Stay: Payer: 59

## 2019-07-07 ENCOUNTER — Other Ambulatory Visit: Payer: Self-pay

## 2019-07-07 VITALS — BP 111/69 | HR 67 | Temp 98.7°F | Resp 16

## 2019-07-07 DIAGNOSIS — D500 Iron deficiency anemia secondary to blood loss (chronic): Secondary | ICD-10-CM | POA: Diagnosis not present

## 2019-07-07 MED ORDER — SODIUM CHLORIDE 0.9 % IV SOLN
200.0000 mg | Freq: Once | INTRAVENOUS | Status: AC
  Administered 2019-07-07: 200 mg via INTRAVENOUS
  Filled 2019-07-07: qty 200

## 2019-07-07 MED ORDER — SODIUM CHLORIDE 0.9 % IV SOLN
INTRAVENOUS | Status: DC
  Filled 2019-07-07: qty 250

## 2019-07-07 NOTE — Patient Instructions (Signed)

## 2019-07-14 ENCOUNTER — Other Ambulatory Visit: Payer: Self-pay

## 2019-07-14 ENCOUNTER — Inpatient Hospital Stay: Payer: 59

## 2019-07-14 VITALS — BP 112/62 | HR 65 | Temp 97.7°F | Resp 17

## 2019-07-14 DIAGNOSIS — D500 Iron deficiency anemia secondary to blood loss (chronic): Secondary | ICD-10-CM

## 2019-07-14 MED ORDER — SODIUM CHLORIDE 0.9 % IV SOLN
200.0000 mg | Freq: Once | INTRAVENOUS | Status: AC
  Administered 2019-07-14: 200 mg via INTRAVENOUS
  Filled 2019-07-14: qty 200

## 2019-07-14 NOTE — Patient Instructions (Signed)

## 2019-07-17 ENCOUNTER — Telehealth: Payer: Self-pay | Admitting: *Deleted

## 2019-07-17 NOTE — Telephone Encounter (Signed)
Call received back from patient stating that she has was unable to get OOB yesterday d/t a bad headache, that her heart races when she is moving, she is dizzy at times and had bleeding for days after a painful BM last week.  Jory Ee NP notified and would like for pt to contact her PCP regarding above symptoms.  Betty Leonard states that she will contact her PCP now and is appreciative of call back.

## 2019-07-17 NOTE — Telephone Encounter (Signed)
Message left to notify pt per order of S. [place] NP that she can f/u as scheduled on October 12, 2019 at 3:15PM, but if she starts to have heavy bleeding again or becomes fatigued or short of breath to contact the office.  Instructed pt to call office back with any questions or concerns.

## 2019-08-15 ENCOUNTER — Encounter: Payer: Self-pay | Admitting: Family

## 2019-08-17 ENCOUNTER — Telehealth: Payer: Self-pay | Admitting: Family

## 2019-08-17 NOTE — Telephone Encounter (Signed)
Called and LMVM for patient regarding lab only appt added. Per 7/28 sch msg

## 2019-08-18 ENCOUNTER — Other Ambulatory Visit: Payer: Self-pay | Admitting: Family

## 2019-08-18 DIAGNOSIS — N920 Excessive and frequent menstruation with regular cycle: Secondary | ICD-10-CM

## 2019-08-18 DIAGNOSIS — D500 Iron deficiency anemia secondary to blood loss (chronic): Secondary | ICD-10-CM

## 2019-08-21 ENCOUNTER — Other Ambulatory Visit: Payer: Self-pay

## 2019-08-21 ENCOUNTER — Inpatient Hospital Stay: Payer: 59 | Attending: Hematology & Oncology

## 2019-08-21 DIAGNOSIS — N920 Excessive and frequent menstruation with regular cycle: Secondary | ICD-10-CM | POA: Diagnosis not present

## 2019-08-21 DIAGNOSIS — D500 Iron deficiency anemia secondary to blood loss (chronic): Secondary | ICD-10-CM | POA: Insufficient documentation

## 2019-08-21 LAB — CBC WITH DIFFERENTIAL (CANCER CENTER ONLY)
Abs Immature Granulocytes: 0.09 10*3/uL — ABNORMAL HIGH (ref 0.00–0.07)
Basophils Absolute: 0.1 10*3/uL (ref 0.0–0.1)
Basophils Relative: 1 %
Eosinophils Absolute: 0.2 10*3/uL (ref 0.0–0.5)
Eosinophils Relative: 3 %
HCT: 44.8 % (ref 36.0–46.0)
Hemoglobin: 14.3 g/dL (ref 12.0–15.0)
Immature Granulocytes: 1 %
Lymphocytes Relative: 33 %
Lymphs Abs: 2.6 10*3/uL (ref 0.7–4.0)
MCH: 26.4 pg (ref 26.0–34.0)
MCHC: 31.9 g/dL (ref 30.0–36.0)
MCV: 82.8 fL (ref 80.0–100.0)
Monocytes Absolute: 0.6 10*3/uL (ref 0.1–1.0)
Monocytes Relative: 8 %
Neutro Abs: 4.4 10*3/uL (ref 1.7–7.7)
Neutrophils Relative %: 54 %
Platelet Count: 342 10*3/uL (ref 150–400)
RBC: 5.41 MIL/uL — ABNORMAL HIGH (ref 3.87–5.11)
RDW: 13.8 % (ref 11.5–15.5)
WBC Count: 8 10*3/uL (ref 4.0–10.5)
nRBC: 0 % (ref 0.0–0.2)

## 2019-08-21 LAB — IRON AND TIBC
Iron: 81 ug/dL (ref 41–142)
Saturation Ratios: 31 % (ref 21–57)
TIBC: 259 ug/dL (ref 236–444)
UIBC: 178 ug/dL (ref 120–384)

## 2019-08-21 LAB — RETICULOCYTES
Immature Retic Fract: 9.4 % (ref 2.3–15.9)
RBC.: 5.33 MIL/uL — ABNORMAL HIGH (ref 3.87–5.11)
Retic Count, Absolute: 71.4 10*3/uL (ref 19.0–186.0)
Retic Ct Pct: 1.3 % (ref 0.4–3.1)

## 2019-08-21 LAB — FERRITIN: Ferritin: 155 ng/mL (ref 11–307)

## 2019-08-22 ENCOUNTER — Telehealth: Payer: Self-pay | Admitting: *Deleted

## 2019-08-22 NOTE — Telephone Encounter (Signed)
Message received from patient requesting her lab results from yesterday.  Call placed back to patient and patient informed per order of S. [place] NP that lab work from 08/21/19 is unremarkable and to contact her PCP if she is still not feeling well.  Pt appreciative of call back and has questions at this time.

## 2019-08-30 ENCOUNTER — Other Ambulatory Visit: Payer: Self-pay

## 2019-08-30 ENCOUNTER — Telehealth: Payer: Self-pay

## 2019-08-30 ENCOUNTER — Encounter: Payer: Self-pay | Admitting: Obstetrics and Gynecology

## 2019-08-30 ENCOUNTER — Ambulatory Visit (INDEPENDENT_AMBULATORY_CARE_PROVIDER_SITE_OTHER): Payer: 59 | Admitting: Obstetrics and Gynecology

## 2019-08-30 VITALS — BP 130/74 | Ht 66.5 in | Wt 222.0 lb

## 2019-08-30 DIAGNOSIS — L659 Nonscarring hair loss, unspecified: Secondary | ICD-10-CM | POA: Diagnosis not present

## 2019-08-30 DIAGNOSIS — N3090 Cystitis, unspecified without hematuria: Secondary | ICD-10-CM | POA: Diagnosis not present

## 2019-08-30 LAB — POCT URINALYSIS DIPSTICK
Bilirubin, UA: NEGATIVE
Glucose, UA: NEGATIVE
Ketones, UA: NEGATIVE
Nitrite, UA: NEGATIVE
Protein, UA: POSITIVE — AB
Urobilinogen, UA: NEGATIVE E.U./dL — AB
pH, UA: 5 (ref 5.0–8.0)

## 2019-08-30 MED ORDER — SULFAMETHOXAZOLE-TRIMETHOPRIM 800-160 MG PO TABS: 1.0000 | ORAL_TABLET | Freq: Two times a day (BID) | ORAL | 0 refills | Status: DC

## 2019-08-30 MED ORDER — PHENAZOPYRIDINE HCL 200 MG PO TABS: 200.0000 mg | ORAL_TABLET | Freq: Three times a day (TID) | ORAL | 0 refills | Status: DC | PRN

## 2019-08-30 MED ORDER — FLUCONAZOLE 150 MG PO TABS: 150.0000 mg | ORAL_TABLET | Freq: Once | ORAL | 0 refills | Status: AC

## 2019-08-30 NOTE — Telephone Encounter (Signed)
Patient is calling in regards to having a UTI or yeast infection. Patient stated she would also like to get a hormone panel done. Need triage to assist.

## 2019-08-30 NOTE — Telephone Encounter (Signed)
AEX 11/24/19 with [facility] Hysteroscopy with Novasure ablation 04/25/19 H/o recurrent UTIs  Spoke with pt. Pt states having urinary burning, frequency, urgency x 2 days now. Pt denies fever, chills, back pain. Pt states has UTI hx. Pt also wanting to know if can have labs drawn today since having hair loss issues. Pt is being followed by Dr Marin Olp.  Pt advised to have OV for further evaluation. Pt agreeable. Pt aware Dr Sabra Heck not in office this week. Pt agreeable to see another provider. Pt scheduled with Dr Talbert Nan today 8/11 at 10 am. Pt verbalized understanding of date and time of appt.   Encounter closed.

## 2019-08-30 NOTE — Progress Notes (Signed)
GYNECOLOGY  VISIT   HPI: 39 y.o.   Single White or Caucasian Not Hispanic or Latino  female   [id number] with No LMP recorded. Patient has had an ablation.   here for Dysuria and frequency of urination and urgency of urination. Symptoms started 3 days ago. Voiding small amounts.    No fevers no flank pain.  She had an endometrial ablation in April, no bleeding since. Feeling better.   She has been struggling with hair loss for about a year, she saw a Dermatologist who gave her steroids, minimal help. That Dermatologist is no longer in practice.   GYNECOLOGIC HISTORY: No LMP recorded. Patient has had an ablation. Contraception:ablation  Menopausal hormone therapy: none         OB History    Gravida  0   Para  0   Term  0   Preterm  0   AB  0   Living  0     SAB  0   TAB  0   Ectopic  0   Multiple  0   Live Births  0              Patient Active Problem List   Diagnosis Date Noted  . Iron deficiency anemia due to chronic blood loss 10/07/2018  . Menorrhagia 10/07/2018  . History of wheezing 06/06/2018  . Adverse food reaction 06/06/2018  . Other allergic rhinitis 06/06/2018  . Hair loss 06/06/2018  . History of frequent URI 06/06/2018  . Anxiety and depression 09/23/2012  . Hx of migraine headaches 09/23/2012  . Tobacco use disorder 09/23/2012  . Nausea with vomiting, chronic - followed by Dr. Deatra Ina and GI 12/16/2011    Past Medical History:  Diagnosis Date  . Anemia    iron infusion sept 2020  . Chicken pox   . Depression   . Esophagitis 12/24/2011  . GERD (gastroesophageal reflux disease)   . Menorrhagia    with irregular cycles  . Migraines   . Pneumonia 07/2018  . Seasonal allergies   . UTI (urinary tract infection) finished antibiotic 04-11-2019    Past Surgical History:  Procedure Laterality Date  . ESOPHAGOGASTRODUODENOSCOPY  12/24/2011   Procedure: ESOPHAGOGASTRODUODENOSCOPY (EGD);  Surgeon: Inda Castle, MD;  Location: [facility];   Service: Endoscopy;  Laterality: N/A;  . FOOT SURGERY Right 2009   repair remotely after a fracture  . FOOT SURGERY Right 1'2020  . HYSTEROSCOPY WITH NOVASURE N/A 04/25/2019   Procedure: HYSTEROSCOPY WITH NOVASURE ABLATION;  Surgeon: Megan Salon, MD;  Location: [facility];  Service: Gynecology;  Laterality: N/A;  . KNEE ARTHROSCOPY Left 2018    Current Outpatient Medications  Medication Sig Dispense Refill  . EPINEPHrine 0.3 mg/0.3 mL IJ SOAJ injection Inject 0.3 mg into the muscle once.    . fexofenadine (ALLEGRA) 180 MG tablet Take 180 mg by mouth daily.    Marland Kitchen ibuprofen (ADVIL) 800 MG tablet Take 1 tablet (800 mg total) by mouth every 8 (eight) hours as needed. 30 tablet 0  . Suvorexant (BELSOMRA) 10 MG TABS Take 1 tablet by mouth at bedtime as needed.    . Vitamin D, Ergocalciferol, (DRISDOL) 1.25 MG (50000 UT) CAPS capsule Take 50,000 Units by mouth 2 (two) times a week.      No current facility-administered medications for this visit.     ALLERGIES: Codeine, Hydrocodone, Iodine, Shellfish allergy, and Aspirin  Family History  Problem Relation Age of Onset  . Leukemia Paternal  Grandfather   . Lung cancer Paternal Grandfather   . Arthritis Paternal Grandfather   . Diabetes Paternal Grandfather   . Heart disease Paternal Grandfather   . Hypertension Paternal Grandfather   . Kidney disease Paternal Grandfather   . Prostate cancer Paternal Grandfather   . Gaucher's disease Paternal Grandfather   . Sudden death Father        age 22, nothing found on autopsy  . Diabetes Father   . Hyperlipidemia Father   . Hypertension Father   . Mental illness Mother        bipolar, schizophrenia  . Alcohol abuse Maternal Grandfather   . Stroke Paternal Grandmother   . Colon cancer Neg Hx     Social History   Socioeconomic History  . Marital status: Single    Spouse name: Not on file  . Number of children: Not on file  . Years of education: Not on file  . Highest  education level: Not on file  Occupational History  . Not on file  Tobacco Use  . Smoking status: Former Smoker    Packs/day: 1.00    Years: 5.00    Pack years: 5.00    Types: Cigarettes    Quit date: 12/29/2018    Years since quitting: 0.6  . Smokeless tobacco: Never Used  . Tobacco comment: stopped on 12/29/2018  Vaping Use  . Vaping Use: Never used  Substance and Sexual Activity  . Alcohol use: Not Currently    Alcohol/week: 0.0 standard drinks  . Drug use: No  . Sexual activity: Not Currently    Birth control/protection: Abstinence  Other Topics Concern  . Not on file  Social History Narrative   Work or Database administrator, [employer]      Home Situation: lives with roomate      Spiritual Beliefs: Christian      Lifestyle: CV exercise (29minutes) 3 days per week; diet is poor               Social Determinants of Radio broadcast assistant Strain:   . Difficulty of Paying Living Expenses:   Food Insecurity:   . Worried About Charity fundraiser in the Last Year:   . Arboriculturist in the Last Year:   Transportation Needs:   . Film/video editor (Medical):   Marland Kitchen Lack of Transportation (Non-Medical):   Physical Activity:   . Days of Exercise per Week:   . Minutes of Exercise per Session:   Stress:   . Feeling of Stress :   Social Connections:   . Frequency of Communication with Friends and Family:   . Frequency of Social Gatherings with Friends and Family:   . Attends Religious Services:   . Active Member of Clubs or Organizations:   . Attends Archivist Meetings:   Marland Kitchen Marital Status:   Intimate Partner Violence:   . Fear of Current or Ex-Partner:   . Emotionally Abused:   Marland Kitchen Physically Abused:   . Sexually Abused:     Review of Systems  Genitourinary: Positive for dysuria, frequency and urgency.  All other systems reviewed and are negative.   PHYSICAL EXAMINATION:    BP 130/74   Ht 5' 6.5" (1.689 m)   Wt 222 lb (100.7 kg)   BMI 35.29  kg/m     General appearance: alert, cooperative and appears stated age Scalp: she has focal alopecia on the top of her head, some hair is growing in.  Neck:  no adenopathy, supple, symmetrical, trachea midline and thyroid normal to inspection and palpation Abdomen: soft, non-tender; non distended, no masses,  no organomegaly CVA: not tender  Urine dip: 2+blood, 3+leuk  ASSESSMENT Cystitis Alopecia. She has been treated for iron deficiency which can cause hair loss, recent levels were normal. She has had premenopausal FSH and estradiol levels this year. In 8/20 she had a normal testosterone level.     PLAN Send urine for ua, c&s Treat with Bactrim, pyridium and diflucan (she requested diflucan) TFT's and RPR (r/o syphilitic alopecia) Will refer her to a different Dermatologist Information given on cystitis and alopecia (from Epic and UTD)   An After Visit Summary was printed and given to the patient.  32 minutes in total patient care

## 2019-08-30 NOTE — Patient Instructions (Signed)
Alopecia Areata, Adult  Alopecia areata is a condition that causes you to lose hair. You may lose hair on your scalp in patches. In some cases, you may lose all the hair on your scalp (alopecia totalis) or all the hair from your face and body (alopecia universalis). Alopecia areata is an autoimmune disease. This means that your body's defense system (immune system) mistakes normal parts of the body for germs or other things that can make you sick. When you have alopecia areata, the immune system attacks the hair follicles. Alopecia areata usually develops in childhood, but it can develop at any age. For some people, their hair grows back on its own and hair loss does not happen again. For others, their hair may fall out and grow back in cycles. The hair loss may last many years. Having this condition can be emotionally difficult, but it is not dangerous. What are the causes? The cause of this condition is not known. What increases the risk? This condition is more likely to develop in people who have:  A family history of alopecia.  A family history of another autoimmune disease, including type 1 diabetes and rheumatoid arthritis.  Asthma and allergies.  Down syndrome. What are the signs or symptoms? Round spots of patchy hair loss on the scalp is the main symptom of this condition. The spots may be mildly itchy. Other symptoms include:  Short dark hairs in the bald patches that are wider at the top (exclamation point hairs).  Dents, white spots, or lines in the fingernails or toenails.  Balding and body hair loss. This is rare. How is this diagnosed? This condition is diagnosed based on your symptoms and family history. Your health care provider will also check your scalp skin, teeth, and nails. Your health care provider may refer you to a specialist in hair and skin disorders (dermatologist). You may also have tests, including:  A hair pull test.  Blood tests or other screening tests  to check for autoimmune diseases, such as thyroid disease or diabetes.  Skin biopsy to confirm the diagnosis.  A procedure to examine the skin with a lighted magnifying instrument (dermoscopy). How is this treated? There is no cure for alopecia areata. Treatment is aimed at promoting the regrowth of hair and preventing the immune system from overreacting. No single treatment is right for all people with alopecia areata. It depends on the type of hair loss you have and how severe it is. Work with your health care provider to find the best treatment for you. Treatment may include:  Having regular checkups to make sure the condition is not getting worse (watchful waiting).  Steroid creams or pills for 6-8 weeks to stop the immune reaction and help hair to regrow more quickly.  Other topical medicines to alter the immune system response and support the hair growth cycle.  Steroid injections.  Therapy and counseling with a support group or therapist if you are having trouble coping with hair loss. Follow these instructions at home:  Learn as much as you can about your condition.  Apply topical creams only as told by your health care provider.  Take over-the-counter and prescription medicines only as told by your health care provider.  Consider getting a wig or products to make hair look fuller or to cover bald spots, if you feel uncomfortable with your appearance.  Get therapy or counseling if you are having a hard time coping with hair loss. Ask your health care provider to recommend  a counselor or support group.  Keep all follow-up visits as told by your health care provider. This is important. Contact a health care provider if:  Your hair loss gets worse, even with treatment.  You have new symptoms.  You are struggling emotionally. Summary  Alopecia areata is an autoimmune condition that makes your body's defense system (immune system) attack the hair follicles. This causes you  to lose hair.  Treatments may include regular checkups to make sure that the condition is not getting worse (watchful waiting), medicines, and steroid injections. This information is not intended to replace advice given to you by your health care provider. Make sure you discuss any questions you have with your health care provider. Document Revised: 12/18/2016 Document Reviewed: 01/24/2016 Elsevier Patient Education  [place]. Urinary Tract Infection, Adult  A urinary tract infection (UTI) is an infection of any part of the urinary tract. The urinary tract includes the kidneys, ureters, bladder, and urethra. These organs make, store, and get rid of urine in the body. Your health care provider may use other names to describe the infection. An upper UTI affects the ureters and kidneys (pyelonephritis). A lower UTI affects the bladder (cystitis) and urethra (urethritis). What are the causes? Most urinary tract infections are caused by bacteria in your genital area, around the entrance to your urinary tract (urethra). These bacteria grow and cause inflammation of your urinary tract. What increases the risk? You are more likely to develop this condition if:  You have a urinary catheter that stays in place (indwelling).  You are not able to control when you urinate or have a bowel movement (you have incontinence).  You are female and you: ? Use a spermicide or diaphragm for birth control. ? Have low estrogen levels. ? Are pregnant.  You have certain genes that increase your risk (genetics).  You are sexually active.  You take antibiotic medicines.  You have a condition that causes your flow of urine to slow down, such as: ? An enlarged prostate, if you are female. ? Blockage in your urethra (stricture). ? A kidney stone. ? A nerve condition that affects your bladder control (neurogenic bladder). ? Not getting enough to drink, or not urinating often.  You have certain medical  conditions, such as: ? Diabetes. ? A weak disease-fighting system (immunesystem). ? Sickle cell disease. ? Gout. ? Spinal cord injury. What are the signs or symptoms? Symptoms of this condition include:  Needing to urinate right away (urgently).  Frequent urination or passing small amounts of urine frequently.  Pain or burning with urination.  Blood in the urine.  Urine that smells bad or unusual.  Trouble urinating.  Cloudy urine.  Vaginal discharge, if you are female.  Pain in the abdomen or the lower back. You may also have:  Vomiting or a decreased appetite.  Confusion.  Irritability or tiredness.  A fever.  Diarrhea. The first symptom in older adults may be confusion. In some cases, they may not have any symptoms until the infection has worsened. How is this diagnosed? This condition is diagnosed based on your medical history and a physical exam. You may also have other tests, including:  Urine tests.  Blood tests.  Tests for sexually transmitted infections (STIs). If you have had more than one UTI, a cystoscopy or imaging studies may be done to determine the cause of the infections. How is this treated? Treatment for this condition includes:  Antibiotic medicine.  Over-the-counter medicines to treat discomfort.  Drinking enough water to stay hydrated. If you have frequent infections or have other conditions such as a kidney stone, you may need to see a health care provider who specializes in the urinary tract (urologist). In rare cases, urinary tract infections can cause sepsis. Sepsis is a life-threatening condition that occurs when the body responds to an infection. Sepsis is treated in the hospital with IV antibiotics, fluids, and other medicines. Follow these instructions at home:  Medicines  Take over-the-counter and prescription medicines only as told by your health care provider.  If you were prescribed an antibiotic medicine, take it as  told by your health care provider. Do not stop using the antibiotic even if you start to feel better. General instructions  Make sure you: ? Empty your bladder often and completely. Do not hold urine for long periods of time. ? Empty your bladder after sex. ? Wipe from front to back after a bowel movement if you are female. Use each tissue one time when you wipe.  Drink enough fluid to keep your urine pale yellow.  Keep all follow-up visits as told by your health care provider. This is important. Contact a health care provider if:  Your symptoms do not get better after 1-2 days.  Your symptoms go away and then return. Get help right away if you have:  Severe pain in your back or your lower abdomen.  A fever.  Nausea or vomiting. Summary  A urinary tract infection (UTI) is an infection of any part of the urinary tract, which includes the kidneys, ureters, bladder, and urethra.  Most urinary tract infections are caused by bacteria in your genital area, around the entrance to your urinary tract (urethra).  Treatment for this condition often includes antibiotic medicines.  If you were prescribed an antibiotic medicine, take it as told by your health care provider. Do not stop using the antibiotic even if you start to feel better.  Keep all follow-up visits as told by your health care provider. This is important. This information is not intended to replace advice given to you by your health care provider. Make sure you discuss any questions you have with your health care provider. Document Revised: 12/23/2017 Document Reviewed: 07/15/2017 Elsevier Patient Education  2020 [employer].

## 2019-08-31 ENCOUNTER — Telehealth: Payer: Self-pay | Admitting: Obstetrics & Gynecology

## 2019-08-31 DIAGNOSIS — N3090 Cystitis, unspecified without hematuria: Secondary | ICD-10-CM

## 2019-08-31 DIAGNOSIS — R350 Frequency of micturition: Secondary | ICD-10-CM

## 2019-08-31 LAB — URINALYSIS, MICROSCOPIC ONLY
Casts: NONE SEEN /lpf
Epithelial Cells (non renal): >10 /hpf — AB (ref 0–10)

## 2019-08-31 LAB — THYROID PANEL WITH TSH
Free Thyroxine Index: 1.8 (ref 1.2–4.9)
T3 Uptake Ratio: 23 % — ABNORMAL LOW (ref 24–39)
T4, Total: 7.7 ug/dL (ref 4.5–12.0)
TSH: 1.12 u[IU]/mL (ref 0.450–4.500)

## 2019-08-31 LAB — RPR: RPR Ser Ql: NONREACTIVE

## 2019-08-31 NOTE — Telephone Encounter (Signed)
Spoke with pt. Pt has seen results on mychart from visit 08/30/19. Pt advised Dr Talbert Nan has not reviewed them. Once reviewed and recommended, will return call to pt. Pt agreeable. Pt states having pain today with urination.   Routing to Dr Talbert Nan.

## 2019-08-31 NOTE — Telephone Encounter (Signed)
Patient just viewed her recent results in MyChart and would like to talk with a nurse. She said " I just cannot function and need someone to explain the results".

## 2019-09-01 LAB — URINE CULTURE

## 2019-09-01 NOTE — Telephone Encounter (Signed)
See result note. Her urinalysis is contaminated (epithelial cells), the culture is negative. Her symptoms are not from an infection. She should see Urology for evaluation of her bladder c/o. If she is agreeable please place the referral.

## 2019-09-01 NOTE — Telephone Encounter (Signed)
Spoke with pt. Pt given results and recommendations per Dr Talbert Nan. Pt agreeable with referral to urology. Pt states will not take abx today. Pt states abx not helping and sx still there. Pt thankful for advice and referral.  Encounter closed.  Salvadore Dom, MD  P [facility] Triage Pool Please let her know that her blood work is fine and her urine culture is negative for infection. She should stop her antibiotics and let us know if her bladder symptoms persist.   Cc: Dr Sabra Heck for [facility]

## 2019-09-05 ENCOUNTER — Telehealth: Payer: Self-pay | Admitting: Obstetrics and Gynecology

## 2019-09-05 DIAGNOSIS — R350 Frequency of micturition: Secondary | ICD-10-CM

## 2019-09-05 DIAGNOSIS — N3090 Cystitis, unspecified without hematuria: Secondary | ICD-10-CM

## 2019-09-05 NOTE — Telephone Encounter (Signed)
Spoke with patient. Patient request referral to [facility], Dr. Venia Minks in [place], [place]. Referral placed. Advised patient I will forward to [facility] for scheduling. Patient will keep appt as scheduled for now at [facility], patient will call to cancel if she is see earlier at [facility]. Questions answered.   Routing to [employer] .   Encounter closed.

## 2019-09-05 NOTE — Telephone Encounter (Signed)
Can you check with [facility] or [facility], if she is willing to drive there.

## 2019-09-05 NOTE — Telephone Encounter (Signed)
Left message to call Sharee Pimple, RN at [facility].    Patient choice for urology.  Alternative option in [place], [facility].  [place], [place] [address] [phone] Joylene Igo)

## 2019-09-05 NOTE — Telephone Encounter (Signed)
Patient returned call, states she contacted [facility] in [place], first available appt greater than 6wks. Patient request referral to [facility] in [place] and [place] be cancelled.   RN contacted [facility] in [place], was advised first available appt 11/06/19.   Patient request to cancel referral to [facility]. Patient states she is unsure if she can wait 6wks to determine the cause of her symptoms, states she may just not even go at this point. Suggested patient keep [facility] appt or referral location of choice and request to be put on wait list for possible earlier appt. Patient states she never scheduled at [facility] because of the wait for appt. Advised I will forward to [facility] for scheduling, will have [facility] f/u with appt details. Advised patient to return call to office if new symptoms develop. Will update Dr. Talbert Nan and return call if any additional recommendations. Patient agreeable.  Routing to [employer] .   Cc: Dr. Talbert Nan

## 2019-09-05 NOTE — Telephone Encounter (Signed)
Spoke with pt. Pt given update from Dr Talbert Nan. Pt agreeable to come for OV. Pt is actually Dr Ammie Ferrier pt. Pt was seen by Dr Talbert Nan while Dr Sabra Heck out of town.   Pt scheduled with Dr Sabra Heck for [place] on 09/07/19 at 9 am to repeat ccua since still having UTI sx. Pt agreeable and verbalized understanding of date and time of appt. Pt states has referral appt with Dr Claudia Desanctis at [facility] on 10/03/19. Pt encouraged to keep appt. Pt agreeable.   Routing to Dr Sabra Heck for [facility].  Encounter closed.

## 2019-09-05 NOTE — Telephone Encounter (Signed)
Spoke with patient and conveyed the appointment details for [facility]. Patient is now scheduled 10/03/19@3pm  with Dr. Jacalyn Lefevre.

## 2019-09-05 NOTE — Telephone Encounter (Signed)
I would have her come in for another office visit and repeat ccua, if still negative, we could potentially treat her for overactive bladder.

## 2019-09-05 NOTE — Telephone Encounter (Signed)
Patient is calling about the referral to [facility]. Patient states the first available appointment is in 6 weeks. She states she is still experiencing the same issues and want to be seen sooner. Patient wants to know where else she can be referred to.

## 2019-09-05 NOTE — Addendum Note (Signed)
Addended by: Burnice Logan on: 09/05/2019 01:26 PM   Modules accepted: Orders

## 2019-09-05 NOTE — Progress Notes (Signed)
GYNECOLOGY  VISIT  CC:   Painful urination  HPI: 39 y.o. G0P0000 Single White or Caucasian female here for complaints of possible UTI.  Reports she feels like she is passing razor blades when she urinates.  Was seen last week.  Urine micro showed crystals, and WBCs.  Was started on antibiotics but advised to stop once urine culture was negative.  Symptoms have improved mildly.  Denies vaginal bleeding or blood in urine.  Has not had any bleeding since the endometrial ablation.  Just feels very uncomfortable.  Denies vaginal bleeding.  Not SA in about 10 years.  Does have appt with urologist 10/03/2019 with Dr. Claudia Leonard.  Felt like she needed evaluation again as that appt is about a month away from now.  poct urine- rbc tr  GYNECOLOGIC HISTORY: No LMP recorded. Patient has had an ablation. Contraception: abstinence Menopausal hormone therapy: none  Patient Active Problem List   Diagnosis Date Noted  . Iron deficiency anemia due to chronic blood loss 10/07/2018  . History of wheezing 06/06/2018  . Adverse food reaction 06/06/2018  . Other allergic rhinitis 06/06/2018  . Hair loss 06/06/2018  . History of frequent URI 06/06/2018  . Anxiety and depression 09/23/2012  . Hx of migraine headaches 09/23/2012  . Nausea with vomiting, chronic - followed by Dr. Deatra Ina and GI 12/16/2011    Past Medical History:  Diagnosis Date  . Anemia    iron infusion sept 2020  . Chicken pox   . Depression   . Esophagitis 12/24/2011  . GERD (gastroesophageal reflux disease)   . Menorrhagia    with irregular cycles  . Migraines   . Pneumonia 07/2018  . Seasonal allergies   . UTI (urinary tract infection) finished antibiotic 04-11-2019    Past Surgical History:  Procedure Laterality Date  . ESOPHAGOGASTRODUODENOSCOPY  12/24/2011   Procedure: ESOPHAGOGASTRODUODENOSCOPY (EGD);  Surgeon: Inda Castle, MD;  Location: [facility];  Service: Endoscopy;  Laterality: N/A;  . FOOT SURGERY Right 2009    repair remotely after a fracture  . FOOT SURGERY Right 1'2020  . HYSTEROSCOPY WITH NOVASURE N/A 04/25/2019   Procedure: HYSTEROSCOPY WITH NOVASURE ABLATION;  Surgeon: Megan Salon, MD;  Location: [facility];  Service: Gynecology;  Laterality: N/A;  . KNEE ARTHROSCOPY Left 2018    MEDS:   Current Outpatient Medications on File Prior to Visit  Medication Sig Dispense Refill  . fexofenadine (ALLEGRA) 180 MG tablet Take 180 mg by mouth daily.    Marland Kitchen ibuprofen (ADVIL) 800 MG tablet Take 1 tablet (800 mg total) by mouth every 8 (eight) hours as needed. 30 tablet 0  . Suvorexant (BELSOMRA) 10 MG TABS Take 1 tablet by mouth at bedtime as needed.    . Vitamin D, Ergocalciferol, (DRISDOL) 1.25 MG (50000 UT) CAPS capsule Take 50,000 Units by mouth 2 (two) times a week.     Marland Kitchen EPINEPHrine 0.3 mg/0.3 mL IJ SOAJ injection Inject 0.3 mg into the muscle once. (Patient not taking: Reported on 09/07/2019)     No current facility-administered medications on file prior to visit.    ALLERGIES: Codeine, Hydrocodone, Iodine, Shellfish allergy, and Aspirin  Family History  Problem Relation Age of Onset  . Leukemia Paternal Grandfather   . Lung cancer Paternal Grandfather   . Arthritis Paternal Grandfather   . Diabetes Paternal Grandfather   . Heart disease Paternal Grandfather   . Hypertension Paternal Grandfather   . Kidney disease Paternal Grandfather   . Prostate cancer Paternal  Grandfather   . Gaucher's disease Paternal Grandfather   . Sudden death Father        age 86, nothing found on autopsy  . Diabetes Father   . Hyperlipidemia Father   . Hypertension Father   . Mental illness Mother        bipolar, schizophrenia  . Alcohol abuse Maternal Grandfather   . Stroke Paternal Grandmother   . Colon cancer Neg Hx     SH:  Single, non smoker  Review of Systems  Constitutional: Negative.   HENT: Negative.   Eyes: Negative.   Respiratory: Negative.   Cardiovascular: Negative.    Gastrointestinal: Negative.   Endocrine: Negative.   Genitourinary: Positive for dyspareunia.       Pain radiating from back around lower pelvic area  Musculoskeletal: Negative.   Skin: Negative.   Allergic/Immunologic: Negative.   Neurological: Negative.   Hematological: Negative.   Psychiatric/Behavioral: Negative.     PHYSICAL EXAMINATION:    BP 108/64   Pulse 68   Resp 16   Wt 220 lb (99.8 kg)   BMI 34.98 kg/m     General appearance: alert, cooperative and appears stated age Flank:  No CVA tenderness Abdomen: soft, non-tender; bowel sounds normal; no masses,  no organomegaly Lymph:  no inguinal LAD noted  Pelvic: External genitalia:  no lesions              Urethra:  normal appearing urethra with no masses, tenderness or lesions              Bartholins and Skenes: normal                 Vagina: normal appearing vagina with normal color and discharge, no lesions              Cervix: no lesions and scant blood from os              Bimanual Exam:  Uterus:  normal size, contour, position, consistency, mobility, non-tender              Adnexa: no mass, fullness, tenderness   Chaperone, Olene Floss, CMA, was present for exam.  Assessment: Dysuria Microscopic hematuria on dip u/a today H/o WBCs in urine on urine micro from 08/30/2019  Plan: Urine micro and culture pending. Macrobid 100mg  bid x 5 days.   If culture negative, will consider antispasmodic medication

## 2019-09-07 ENCOUNTER — Encounter: Payer: Self-pay | Admitting: Obstetrics & Gynecology

## 2019-09-07 ENCOUNTER — Ambulatory Visit (INDEPENDENT_AMBULATORY_CARE_PROVIDER_SITE_OTHER): Payer: 59 | Admitting: Obstetrics & Gynecology

## 2019-09-07 ENCOUNTER — Other Ambulatory Visit: Payer: Self-pay

## 2019-09-07 VITALS — BP 108/64 | HR 68 | Resp 16 | Wt 220.0 lb

## 2019-09-07 DIAGNOSIS — R300 Dysuria: Secondary | ICD-10-CM

## 2019-09-07 DIAGNOSIS — N390 Urinary tract infection, site not specified: Secondary | ICD-10-CM

## 2019-09-07 DIAGNOSIS — R319 Hematuria, unspecified: Secondary | ICD-10-CM | POA: Diagnosis not present

## 2019-09-07 LAB — POCT URINALYSIS DIPSTICK
Bilirubin, UA: NEGATIVE
Glucose, UA: NEGATIVE
Ketones, UA: NEGATIVE
Leukocytes, UA: NEGATIVE
Nitrite, UA: NEGATIVE
Protein, UA: NEGATIVE
Urobilinogen, UA: NEGATIVE E.U./dL — AB
pH, UA: 5 (ref 5.0–8.0)

## 2019-09-07 MED ORDER — NITROFURANTOIN MONOHYD MACRO 100 MG PO CAPS: 100.0000 mg | ORAL_CAPSULE | Freq: Two times a day (BID) | ORAL | 0 refills | Status: DC

## 2019-09-08 LAB — URINALYSIS, MICROSCOPIC ONLY
Casts: NONE SEEN /lpf
Epithelial Cells (non renal): >10 /hpf — AB (ref 0–10)

## 2019-09-09 LAB — URINE CULTURE

## 2019-09-11 ENCOUNTER — Telehealth: Payer: Self-pay | Admitting: Obstetrics & Gynecology

## 2019-09-11 ENCOUNTER — Encounter: Payer: Self-pay | Admitting: Obstetrics & Gynecology

## 2019-09-11 DIAGNOSIS — R300 Dysuria: Secondary | ICD-10-CM

## 2019-09-11 DIAGNOSIS — R319 Hematuria, unspecified: Secondary | ICD-10-CM

## 2019-09-11 MED ORDER — MIRABEGRON ER 25 MG PO TB24: 25.0000 mg | ORAL_TABLET | Freq: Every day | ORAL | 0 refills | Status: DC

## 2019-09-11 NOTE — Telephone Encounter (Signed)
Spoke with Betty Leonard. Betty Leonard states seeing results on mychart of UA and micro. Betty Leonard wanting to know next steps. Betty Leonard advised will review with Dr Sabra Heck and return call. Betty Leonard agreeable.  Betty Leonard states still having same sx from [place] on 09/07/19.  Routing to Dr Sabra Heck

## 2019-09-11 NOTE — Telephone Encounter (Signed)
Patient called regarding recent results asking if they have been reviewed.?

## 2019-09-11 NOTE — Telephone Encounter (Signed)
Betty Salon, MD  Georgia Lopes, RN Please let pt know her urine culture is negative for infection. She could consider using an antispasmodic to see if this helps before considering seeing urology. Would suggest trying myrbetriq 25mg  daily. #30/0RF. Most common side effect is hypertension so should check her BP at home or with [place] in 2 weeks after starting. She does have appt with Dr. Claudia Desanctis 10/03/2019 (urology).

## 2019-09-11 NOTE — Telephone Encounter (Signed)
Spoke with pt. Pt given results and recommendations per Dr Sabra Heck. Pt agreeable and verbalized understanding. Pt agreeable to take Myrbetriq Rx. Pt states will keep urology appt with Dr Claudia Desanctis on 10/03/19. Rx Myrbetriq sent to pharmacy on file. # 30, 0RF. Pt states will be in [place] in 2 weeks and will get BP checked and send mychart message to Dr Sabra Heck.  Encounter closed.

## 2019-10-04 ENCOUNTER — Inpatient Hospital Stay (HOSPITAL_BASED_OUTPATIENT_CLINIC_OR_DEPARTMENT_OTHER): Payer: 59 | Admitting: Hematology & Oncology

## 2019-10-04 ENCOUNTER — Encounter: Payer: Self-pay | Admitting: Hematology & Oncology

## 2019-10-04 ENCOUNTER — Inpatient Hospital Stay: Payer: 59 | Attending: Hematology & Oncology

## 2019-10-04 ENCOUNTER — Telehealth: Payer: Self-pay | Admitting: Hematology & Oncology

## 2019-10-04 ENCOUNTER — Other Ambulatory Visit: Payer: Self-pay

## 2019-10-04 VITALS — BP 110/86 | HR 79 | Temp 98.3°F | Resp 17 | Wt 220.5 lb

## 2019-10-04 DIAGNOSIS — N920 Excessive and frequent menstruation with regular cycle: Secondary | ICD-10-CM | POA: Diagnosis present

## 2019-10-04 DIAGNOSIS — N809 Endometriosis, unspecified: Secondary | ICD-10-CM | POA: Insufficient documentation

## 2019-10-04 DIAGNOSIS — Z79899 Other long term (current) drug therapy: Secondary | ICD-10-CM | POA: Insufficient documentation

## 2019-10-04 DIAGNOSIS — D500 Iron deficiency anemia secondary to blood loss (chronic): Secondary | ICD-10-CM | POA: Diagnosis not present

## 2019-10-04 DIAGNOSIS — D72829 Elevated white blood cell count, unspecified: Secondary | ICD-10-CM | POA: Insufficient documentation

## 2019-10-04 DIAGNOSIS — R519 Headache, unspecified: Secondary | ICD-10-CM | POA: Diagnosis not present

## 2019-10-04 LAB — CBC WITH DIFFERENTIAL (CANCER CENTER ONLY)
Abs Immature Granulocytes: 0.03 10*3/uL (ref 0.00–0.07)
Basophils Absolute: 0.1 10*3/uL (ref 0.0–0.1)
Basophils Relative: 1 %
Eosinophils Absolute: 0.2 10*3/uL (ref 0.0–0.5)
Eosinophils Relative: 2 %
HCT: 43.5 % (ref 36.0–46.0)
Hemoglobin: 13.6 g/dL (ref 12.0–15.0)
Immature Granulocytes: 0 %
Lymphocytes Relative: 29 %
Lymphs Abs: 2.6 10*3/uL (ref 0.7–4.0)
MCH: 26.5 pg (ref 26.0–34.0)
MCHC: 31.3 g/dL (ref 30.0–36.0)
MCV: 84.6 fL (ref 80.0–100.0)
Monocytes Absolute: 0.8 10*3/uL (ref 0.1–1.0)
Monocytes Relative: 9 %
Neutro Abs: 5.3 10*3/uL (ref 1.7–7.7)
Neutrophils Relative %: 59 %
Platelet Count: 334 10*3/uL (ref 150–400)
RBC: 5.14 MIL/uL — ABNORMAL HIGH (ref 3.87–5.11)
RDW: 13.8 % (ref 11.5–15.5)
WBC Count: 8.9 10*3/uL (ref 4.0–10.5)
nRBC: 0 % (ref 0.0–0.2)

## 2019-10-04 LAB — CMP (CANCER CENTER ONLY)
ALT: 9 U/L (ref 0–44)
AST: 9 U/L — ABNORMAL LOW (ref 15–41)
Albumin: 4.1 g/dL (ref 3.5–5.0)
Alkaline Phosphatase: 54 U/L (ref 38–126)
Anion gap: 8 (ref 5–15)
BUN: 14 mg/dL (ref 6–20)
CO2: 28 mmol/L (ref 22–32)
Calcium: 9.6 mg/dL (ref 8.9–10.3)
Chloride: 103 mmol/L (ref 98–111)
Creatinine: 0.88 mg/dL (ref 0.44–1.00)
GFR, Est AFR Am: >60 mL/min (ref >60)
GFR, Estimated: >60 mL/min (ref >60)
Glucose, Bld: 89 mg/dL (ref 70–99)
Potassium: 4.3 mmol/L (ref 3.5–5.1)
Sodium: 139 mmol/L (ref 135–145)
Total Bilirubin: 0.5 mg/dL (ref 0.3–1.2)
Total Protein: 7 g/dL (ref 6.5–8.1)

## 2019-10-04 LAB — SAVE SMEAR(SSMR), FOR PROVIDER SLIDE REVIEW

## 2019-10-04 NOTE — Progress Notes (Signed)
Hematology and Oncology Follow Up Visit  Betty Leonard [id number] 28-Aug-1980 39 y.o. 10/04/2019   Principle Diagnosis:  Iron deficiency anemia secondary to heavy cycles Mild leukocytosis - reactive, endometriosis   Current Therapy:  IV Iron -- Venofer given in 06/2019   Interim History:  Betty Leonard is here today for follow-up.  She is doing okay.  She just got back from [facility].  She was out there for couple weeks.  Apparently, her brother lives out there.  There is been no problems with nausea or vomiting.  There is been no issues with bleeding.  She apparently had a uterine ablation earlier this year.  She still has headaches.  She still feels a little tired.  When she had her iron studies done back in early August, her ferritin was 155 with an iron saturation of 31%.  She has had no problems working.  She has had no leg swelling.  There is been no rashes.  She has had no cough or shortness of breath.  Overall, her performance status is ECOG 1.    Allergies as of 10/04/2019      Reactions   Codeine Shortness Of Breath   Hydrocodone Shortness Of Breath   Iodine Other (See Comments)   Shellfish allergy   Shellfish Allergy Anaphylaxis   Aspirin Hives      Medication List       Accurate as of October 04, 2019  3:56 PM. If you have any questions, ask your nurse or doctor.        Belsomra 10 MG Tabs Generic drug: Suvorexant Take 1 tablet by mouth at bedtime as needed.   EPINEPHrine 0.3 mg/0.3 mL Soaj injection Commonly known as: EPI-PEN Inject 0.3 mg into the muscle once.   fexofenadine 180 MG tablet Commonly known as: ALLEGRA Take 180 mg by mouth daily.   ibuprofen 800 MG tablet Commonly known as: ADVIL Take 1 tablet (800 mg total) by mouth every 8 (eight) hours as needed.   mirabegron ER 25 MG Tb24 tablet Commonly known as: Myrbetriq Take 1 tablet (25 mg total) by mouth daily.   nitrofurantoin (macrocrystal-monohydrate) 100 MG capsule Commonly  known as: MACROBID Take 1 capsule (100 mg total) by mouth 2 (two) times daily.   URIBEL PO Take by mouth once.   Vitamin D (Ergocalciferol) 1.25 MG (50000 UNIT) Caps capsule Commonly known as: DRISDOL Take 50,000 Units by mouth 2 (two) times a week.       Allergies:  Allergies  Allergen Reactions  . Codeine Shortness Of Breath  . Hydrocodone Shortness Of Breath  . Iodine Other (See Comments)    Shellfish allergy  . Shellfish Allergy Anaphylaxis  . Aspirin Hives    Past Medical History, Surgical history, Social history, and Family History were reviewed and updated.  Review of Systems: Review of Systems  Constitutional: Negative.   Eyes: Negative.   Respiratory: Negative.   Cardiovascular: Negative.   Gastrointestinal: Negative.   Genitourinary: Negative.   Musculoskeletal: Negative.   Skin: Negative.   Neurological: Positive for headaches.  Endo/Heme/Allergies: Negative.   Psychiatric/Behavioral: Negative.      Physical Exam:  weight is 220 lb 8 oz (100 kg). Her oral temperature is 98.3 F (36.8 C). Her blood pressure is 110/86 and her pulse is 79. Her respiration is 17 and oxygen saturation is 100%.   Wt Readings from Last 3 Encounters:  10/04/19 220 lb 8 oz (100 kg)  09/07/19 220 lb (99.8 kg)  08/30/19 222  lb (100.7 kg)    Physical Exam Vitals reviewed.  HENT:     Head: Normocephalic and atraumatic.  Eyes:     Pupils: Pupils are equal, round, and reactive to light.  Cardiovascular:     Rate and Rhythm: Normal rate and regular rhythm.     Heart sounds: Normal heart sounds.  Pulmonary:     Effort: Pulmonary effort is normal.     Breath sounds: Normal breath sounds.  Abdominal:     General: Bowel sounds are normal.     Palpations: Abdomen is soft.  Musculoskeletal:        General: No tenderness or deformity. Normal range of motion.     Cervical back: Normal range of motion.  Lymphadenopathy:     Cervical: No cervical adenopathy.  Skin:     General: Skin is warm and dry.     Findings: No erythema or rash.  Neurological:     Mental Status: She is alert and oriented to person, place, and time.  Psychiatric:        Behavior: Behavior normal.        Thought Content: Thought content normal.        Judgment: Judgment normal.      Lab Results  Component Value Date   WBC 8.9 10/04/2019   HGB 13.6 10/04/2019   HCT 43.5 10/04/2019   MCV 84.6 10/04/2019   PLT 334 10/04/2019   Lab Results  Component Value Date   FERRITIN 155 08/21/2019   IRON 81 08/21/2019   TIBC 259 08/21/2019   UIBC 178 08/21/2019   IRONPCTSAT 31 08/21/2019   Lab Results  Component Value Date   RETICCTPCT 1.3 08/21/2019   RBC 5.14 (H) 10/04/2019   No results found for: Nils Pyle [facility] Lab Results  Component Value Date   IGGSERUM 1,067 06/06/2018   IGMSERUM 244 (H) 06/06/2018   No results found for: Ronnald Ramp, A1GS, Nelida Meuse, SPEI   Chemistry      Component Value Date/Time   NA 139 10/04/2019 1435   K 4.3 10/04/2019 1435   CL 103 10/04/2019 1435   CO2 28 10/04/2019 1435   BUN 14 10/04/2019 1435   CREATININE 0.88 10/04/2019 1435      Component Value Date/Time   CALCIUM 9.6 10/04/2019 1435   ALKPHOS 54 10/04/2019 1435   AST 9 (L) 10/04/2019 1435   ALT 9 10/04/2019 1435   BILITOT 0.5 10/04/2019 1435       Impression and Plan: Betty Leonard is a very pleasant 39 yo caucasian female with history of iron deficiency anemia secondary to heavy cycles and mild leukocytosis felt to be reactive with endometriosis.   Again, she has had her uterine ablation.  I am sure this probably is going to help the issue.  Her MCV is higher.  I would think that her iron should be okay.  We will see everything looks.  We will let her know if the iron is on the low side.  We will plan to get her back after the holidays.  I think this would be amenable to her and would be helpful for her.   [name], MD 9/15/20213:56 PM

## 2019-10-04 NOTE — Telephone Encounter (Signed)
Appointments scheduled patient has My Chart Access 9/15 los

## 2019-10-05 LAB — IRON AND TIBC
Iron: 37 ug/dL — ABNORMAL LOW (ref 41–142)
Saturation Ratios: 14 % — ABNORMAL LOW (ref 21–57)
TIBC: 265 ug/dL (ref 236–444)
UIBC: 228 ug/dL (ref 120–384)

## 2019-10-05 LAB — FERRITIN: Ferritin: 183 ng/mL (ref 11–307)

## 2019-10-12 ENCOUNTER — Ambulatory Visit: Payer: 59 | Admitting: Hematology & Oncology

## 2019-10-12 ENCOUNTER — Other Ambulatory Visit: Payer: 59

## 2019-10-13 ENCOUNTER — Inpatient Hospital Stay: Payer: 59

## 2019-10-13 ENCOUNTER — Other Ambulatory Visit: Payer: Self-pay

## 2019-10-13 VITALS — BP 101/76 | HR 71 | Temp 98.6°F | Resp 18

## 2019-10-13 DIAGNOSIS — D500 Iron deficiency anemia secondary to blood loss (chronic): Secondary | ICD-10-CM

## 2019-10-13 MED ORDER — SODIUM CHLORIDE 0.9 % IV SOLN
INTRAVENOUS | Status: DC
  Filled 2019-10-13: qty 250

## 2019-10-13 MED ORDER — SODIUM CHLORIDE 0.9 % IV SOLN
200.0000 mg | Freq: Once | INTRAVENOUS | Status: AC
  Administered 2019-10-13: 200 mg via INTRAVENOUS
  Filled 2019-10-13: qty 200

## 2019-10-13 NOTE — Patient Instructions (Signed)
Iron Sucrose injection (Venofer)  What is this medicine? IRON SUCROSE (AHY ern SOO krohs) is an iron complex. Iron is used to make healthy red blood cells, which carry oxygen and nutrients throughout the body. This medicine is used to treat iron deficiency anemia in people with chronic kidney disease. This medicine may be used for other purposes; ask your health care provider or pharmacist if you have questions. COMMON BRAND NAME(S): Venofer What should I tell my health care provider before I take this medicine? They need to know if you have any of these conditions:  anemia not caused by low iron levels  heart disease  high levels of iron in the blood  kidney disease  liver disease  an unusual or allergic reaction to iron, other medicines, foods, dyes, or preservatives  pregnant or trying to get pregnant  breast-feeding How should I use this medicine? This medicine is for infusion into a vein. It is given by a health care professional in a hospital or clinic setting. Talk to your pediatrician regarding the use of this medicine in children. While this drug may be prescribed for children as young as 2 years for selected conditions, precautions do apply. Overdosage: If you think you have taken too much of this medicine contact a poison control center or emergency room at once. NOTE: This medicine is only for you. Do not share this medicine with others. What if I miss a dose? It is important not to miss your dose. Call your doctor or health care professional if you are unable to keep an appointment. What may interact with this medicine? Do not take this medicine with any of the following medications:  deferoxamine  dimercaprol  other iron products This medicine may also interact with the following medications:  chloramphenicol  deferasirox This list may not describe all possible interactions. Give your health care provider a list of all the medicines, herbs, non-prescription  drugs, or dietary supplements you use. Also tell them if you smoke, drink alcohol, or use illegal drugs. Some items may interact with your medicine. What should I watch for while using this medicine? Visit your doctor or healthcare professional regularly. Tell your doctor or healthcare professional if your symptoms do not start to get better or if they get worse. You may need blood work done while you are taking this medicine. You may need to follow a special diet. Talk to your doctor. Foods that contain iron include: whole grains/cereals, dried fruits, beans, or peas, leafy green vegetables, and organ meats (liver, kidney). What side effects may I notice from receiving this medicine? Side effects that you should report to your doctor or health care professional as soon as possible:  allergic reactions like skin rash, itching or hives, swelling of the face, lips, or tongue  breathing problems  changes in blood pressure  cough  fast, irregular heartbeat  feeling faint or lightheaded, falls  fever or chills  flushing, sweating, or hot feelings  joint or muscle aches/pains  seizures  swelling of the ankles or feet  unusually weak or tired Side effects that usually do not require medical attention (report to your doctor or health care professional if they continue or are bothersome):  diarrhea  feeling achy  headache  irritation at site where injected  nausea, vomiting  stomach upset  tiredness This list may not describe all possible side effects. Call your doctor for medical advice about side effects. You may report side effects to FDA at 1-800-FDA-1088. Where should   I keep my medicine? This drug is given in a hospital or clinic and will not be stored at home. NOTE: This sheet is a summary. It may not cover all possible information. If you have questions about this medicine, talk to your doctor, pharmacist, or health care provider.  2020 Elsevier/Gold Standard  (2010-10-16 17:14:35)  

## 2019-10-13 NOTE — Progress Notes (Signed)
Patient states when the iron infused over 15 minutes she felt "foggy" and had a headache. Patient states when the infusion was ran slower in the past she felt ok. After speaking with Trixie Rude, pharmacist, IV venofer to infuse over 45 minutes. Patient verbalized understanding.

## 2019-10-13 NOTE — Addendum Note (Signed)
Addended by: Burney Gauze R on: 10/13/2019 02:10 PM   Modules accepted: Orders

## 2019-10-20 ENCOUNTER — Other Ambulatory Visit: Payer: Self-pay | Admitting: Family

## 2019-10-20 ENCOUNTER — Inpatient Hospital Stay: Payer: 59 | Attending: Hematology & Oncology

## 2019-10-20 ENCOUNTER — Other Ambulatory Visit: Payer: Self-pay

## 2019-10-20 VITALS — BP 92/62 | HR 73 | Temp 97.9°F | Resp 16

## 2019-10-20 DIAGNOSIS — N920 Excessive and frequent menstruation with regular cycle: Secondary | ICD-10-CM | POA: Insufficient documentation

## 2019-10-20 DIAGNOSIS — D500 Iron deficiency anemia secondary to blood loss (chronic): Secondary | ICD-10-CM | POA: Diagnosis present

## 2019-10-20 MED ORDER — SODIUM CHLORIDE 0.9 % IV SOLN
Freq: Once | INTRAVENOUS | Status: AC
  Filled 2019-10-20: qty 250

## 2019-10-20 MED ORDER — SODIUM CHLORIDE 0.9 % IV SOLN
200.0000 mg | Freq: Once | INTRAVENOUS | Status: AC
  Administered 2019-10-20: 200 mg via INTRAVENOUS
  Filled 2019-10-20: qty 200

## 2019-10-20 NOTE — Patient Instructions (Signed)
Iron Sucrose injection (Venofer)  What is this medicine? IRON SUCROSE (AHY ern SOO krohs) is an iron complex. Iron is used to make healthy red blood cells, which carry oxygen and nutrients throughout the body. This medicine is used to treat iron deficiency anemia in people with chronic kidney disease. This medicine may be used for other purposes; ask your health care provider or pharmacist if you have questions. COMMON BRAND NAME(S): Venofer What should I tell my health care provider before I take this medicine? They need to know if you have any of these conditions:  anemia not caused by low iron levels  heart disease  high levels of iron in the blood  kidney disease  liver disease  an unusual or allergic reaction to iron, other medicines, foods, dyes, or preservatives  pregnant or trying to get pregnant  breast-feeding How should I use this medicine? This medicine is for infusion into a vein. It is given by a health care professional in a hospital or clinic setting. Talk to your pediatrician regarding the use of this medicine in children. While this drug may be prescribed for children as young as 2 years for selected conditions, precautions do apply. Overdosage: If you think you have taken too much of this medicine contact a poison control center or emergency room at once. NOTE: This medicine is only for you. Do not share this medicine with others. What if I miss a dose? It is important not to miss your dose. Call your doctor or health care professional if you are unable to keep an appointment. What may interact with this medicine? Do not take this medicine with any of the following medications:  deferoxamine  dimercaprol  other iron products This medicine may also interact with the following medications:  chloramphenicol  deferasirox This list may not describe all possible interactions. Give your health care provider a list of all the medicines, herbs, non-prescription  drugs, or dietary supplements you use. Also tell them if you smoke, drink alcohol, or use illegal drugs. Some items may interact with your medicine. What should I watch for while using this medicine? Visit your doctor or healthcare professional regularly. Tell your doctor or healthcare professional if your symptoms do not start to get better or if they get worse. You may need blood work done while you are taking this medicine. You may need to follow a special diet. Talk to your doctor. Foods that contain iron include: whole grains/cereals, dried fruits, beans, or peas, leafy green vegetables, and organ meats (liver, kidney). What side effects may I notice from receiving this medicine? Side effects that you should report to your doctor or health care professional as soon as possible:  allergic reactions like skin rash, itching or hives, swelling of the face, lips, or tongue  breathing problems  changes in blood pressure  cough  fast, irregular heartbeat  feeling faint or lightheaded, falls  fever or chills  flushing, sweating, or hot feelings  joint or muscle aches/pains  seizures  swelling of the ankles or feet  unusually weak or tired Side effects that usually do not require medical attention (report to your doctor or health care professional if they continue or are bothersome):  diarrhea  feeling achy  headache  irritation at site where injected  nausea, vomiting  stomach upset  tiredness This list may not describe all possible side effects. Call your doctor for medical advice about side effects. You may report side effects to FDA at 1-800-FDA-1088. Where should   I keep my medicine? This drug is given in a hospital or clinic and will not be stored at home. NOTE: This sheet is a summary. It may not cover all possible information. If you have questions about this medicine, talk to your doctor, pharmacist, or health care provider.  2020 Elsevier/Gold Standard  (2010-10-16 17:14:35)  

## 2019-10-26 ENCOUNTER — Inpatient Hospital Stay: Payer: 59

## 2019-10-26 ENCOUNTER — Other Ambulatory Visit: Payer: Self-pay

## 2019-10-26 VITALS — BP 112/64 | HR 83 | Temp 98.1°F | Resp 17

## 2019-10-26 DIAGNOSIS — D500 Iron deficiency anemia secondary to blood loss (chronic): Secondary | ICD-10-CM

## 2019-10-26 DIAGNOSIS — N920 Excessive and frequent menstruation with regular cycle: Secondary | ICD-10-CM | POA: Diagnosis not present

## 2019-10-26 MED ORDER — SODIUM CHLORIDE 0.9 % IV SOLN
200.0000 mg | Freq: Once | INTRAVENOUS | Status: AC
  Administered 2019-10-26: 200 mg via INTRAVENOUS
  Filled 2019-10-26: qty 200

## 2019-10-26 NOTE — Patient Instructions (Signed)

## 2019-10-26 NOTE — Progress Notes (Signed)
Pt declined to stay for post infusion observation period. Pt stated she has tolerated medication multiple times prior without difficulty. Pt aware to call clinic with any questions or concerns. Pt verbalized understanding and had no further questions.  ? ?

## 2019-10-27 ENCOUNTER — Inpatient Hospital Stay: Payer: 59

## 2019-11-03 ENCOUNTER — Other Ambulatory Visit: Payer: Self-pay

## 2019-11-03 ENCOUNTER — Inpatient Hospital Stay: Payer: 59

## 2019-11-03 VITALS — BP 103/68 | HR 70 | Temp 98.7°F | Resp 17

## 2019-11-03 DIAGNOSIS — N920 Excessive and frequent menstruation with regular cycle: Secondary | ICD-10-CM | POA: Diagnosis not present

## 2019-11-03 DIAGNOSIS — D500 Iron deficiency anemia secondary to blood loss (chronic): Secondary | ICD-10-CM

## 2019-11-03 MED ORDER — SODIUM CHLORIDE 0.9 % IV SOLN
200.0000 mg | Freq: Once | INTRAVENOUS | Status: AC
  Administered 2019-11-03: 200 mg via INTRAVENOUS
  Filled 2019-11-03: qty 10

## 2019-11-03 MED ORDER — SODIUM CHLORIDE 0.9 % IV SOLN
Freq: Once | INTRAVENOUS | Status: AC
  Filled 2019-11-03: qty 250

## 2019-11-03 NOTE — Patient Instructions (Signed)

## 2019-11-03 NOTE — Progress Notes (Signed)
Pt declined to stay for post infusion observation period. Pt stated she has tolerated medication multiple times prior without difficulty. Pt aware to call clinic with any questions or concerns. Pt verbalized understanding and had no further questions.  Pt discharged in stable condition.  

## 2019-11-10 ENCOUNTER — Other Ambulatory Visit: Payer: Self-pay

## 2019-11-10 ENCOUNTER — Inpatient Hospital Stay: Payer: 59

## 2019-11-10 VITALS — BP 105/63 | HR 66 | Temp 98.0°F | Resp 18

## 2019-11-10 DIAGNOSIS — D500 Iron deficiency anemia secondary to blood loss (chronic): Secondary | ICD-10-CM

## 2019-11-10 DIAGNOSIS — N920 Excessive and frequent menstruation with regular cycle: Secondary | ICD-10-CM | POA: Diagnosis not present

## 2019-11-10 MED ORDER — SODIUM CHLORIDE 0.9 % IV SOLN
200.0000 mg | Freq: Once | INTRAVENOUS | Status: AC
  Administered 2019-11-10: 200 mg via INTRAVENOUS
  Filled 2019-11-10: qty 200

## 2019-11-10 NOTE — Patient Instructions (Signed)

## 2019-11-10 NOTE — Progress Notes (Signed)
Pt discharged in no apparent distress. Pt left ambulatory without assistance. Pt aware of discharge instructions and verbalized understanding and had no further questions.  

## 2019-11-23 NOTE — Progress Notes (Addendum)
39 y.o. G0P0000 Single White or Caucasian female here for annual exam.  Doing well.  Denies vaginal bleeding.  Has had 5 iron infusions in June and then again October.  She has follow up blood work planned Nov 15th.  She is being followed by Dr. Marin Olp.    Has seen Dr. Claudia Desanctis this week.  Urine was negative for blood.  She offered treatment for IC with bladder instillations.  She doesn't want to do it right now.  Will consider later.    Has questions about Covid booster.  Answered to best of my ability.  No LMP recorded. Patient has had an ablation.          Sexually active: No.  The current method of family planning is abstinence.    Exercising: Yes, stationary bike   Smoker:  Quit 12/29/2018  Health Maintenance: Pap:    09/19/18 Neg:Neg HR HPV  05-06-17 LSIL:Pos HR HPV History of abnormal Pap:  yes MMG:  N/A Colonoscopy:  NA BMD:   NA TDaP:  About 2013 Pneumonia vaccine(s):  2017 Shingrix:   NA Hep C testing: No  Screening Labs: No   reports that she quit smoking about 10 months ago. Her smoking use included cigarettes. She has a 5.00 pack-year smoking history. She has never used smokeless tobacco. She reports previous alcohol use. She reports that she does not use drugs.  Past Medical History:  Diagnosis Date  . Anemia    iron infusion sept 2020  . Chicken pox   . Depression   . Esophagitis 12/24/2011  . GERD (gastroesophageal reflux disease)   . Menorrhagia    with irregular cycles  . Migraines   . Pneumonia 07/2018  . Seasonal allergies   . UTI (urinary tract infection) finished antibiotic 04-11-2019    Past Surgical History:  Procedure Laterality Date  . ESOPHAGOGASTRODUODENOSCOPY  12/24/2011   Procedure: ESOPHAGOGASTRODUODENOSCOPY (EGD);  Surgeon: Inda Castle, MD;  Location: [facility];  Service: Endoscopy;  Laterality: N/A;  . FOOT SURGERY Right 2009   repair remotely after a fracture  . FOOT SURGERY Right 1'2020  . HYSTEROSCOPY WITH NOVASURE N/A 04/25/2019    Procedure: HYSTEROSCOPY WITH NOVASURE ABLATION;  Surgeon: Megan Salon, MD;  Location[facility];  Service: Gynecology;  Laterality: N/A;  . KNEE ARTHROSCOPY Left 2018    Current Outpatient Medications  Medication Sig Dispense Refill  . buPROPion (WELLBUTRIN XL) 150 MG 24 hr tablet Take 150 mg by mouth every morning.    Marland Kitchen EPINEPHrine 0.3 mg/0.3 mL IJ SOAJ injection Inject 0.3 mg into the muscle once.     . fexofenadine (ALLEGRA) 180 MG tablet Take 180 mg by mouth daily. (Patient not taking: Reported on 11/24/2019)    . ibuprofen (ADVIL) 800 MG tablet Take 1 tablet (800 mg total) by mouth every 8 (eight) hours as needed. (Patient not taking: Reported on 11/24/2019) 30 tablet 0  . Suvorexant (BELSOMRA) 10 MG TABS Take 1 tablet by mouth at bedtime as needed. (Patient not taking: Reported on 11/24/2019)     No current facility-administered medications for this visit.    Family History  Problem Relation Age of Onset  . Leukemia Paternal Grandfather   . Lung cancer Paternal Grandfather   . Arthritis Paternal Grandfather   . Diabetes Paternal Grandfather   . Heart disease Paternal Grandfather   . Hypertension Paternal Grandfather   . Kidney disease Paternal Grandfather   . Prostate cancer Paternal Grandfather   . Gaucher's disease  Paternal Grandfather   . Sudden death Father        age 101, nothing found on autopsy  . Diabetes Father   . Hyperlipidemia Father   . Hypertension Father   . Mental illness Mother        bipolar, schizophrenia  . Alcohol abuse Maternal Grandfather   . Stroke Paternal Grandmother   . Colon cancer Neg Hx     Review of Systems  All other systems reviewed and are negative.   Exam:   BP 112/70 (BP Location: Right Arm, Patient Position: Sitting, Cuff Size: Large)   Pulse 84   Resp 16   Ht 5' 6.75" (1.695 m)   Wt 223 lb (101.2 kg)   BMI 35.19 kg/m   Height: 5' 6.75" (169.5 cm)  General appearance: alert, cooperative and appears stated  age Head: Normocephalic, without obvious abnormality, atraumatic Neck: no adenopathy, supple, symmetrical, trachea midline and thyroid normal to inspection and palpation Lungs: clear to auscultation bilaterally Breasts: normal appearance, no masses or tenderness Heart: regular rate and rhythm Abdomen: soft, non-tender; bowel sounds normal; no masses,  no organomegaly Extremities: extremities normal, atraumatic, no cyanosis or edema Skin: Skin color, texture, turgor normal. No rashes or lesions Lymph nodes: Cervical, supraclavicular, and axillary nodes normal. No abnormal inguinal nodes palpated Neurologic: Grossly normal   Pelvic: External genitalia:  no lesions              Urethra:  normal appearing urethra with no masses, tenderness or lesions              Bartholins and Skenes: normal                 Vagina: normal appearing vagina with normal color and discharge, no lesions              Cervix: no lesions              Pap taken: No. Bimanual Exam:  Uterus:  normal size, contour, position, consistency, mobility, non-tender              Adnexa: normal adnexa and no mass, fullness, tenderness               Rectovaginal: Confirms               Anus:  normal sphincter tone, no lesions  Chaperone, Terence Lux, CMA, was present for exam.  A:  Well Woman with normal exam H/o iron deficiency anemia, followed by Dr. 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 (recall for one year was placed but current ASCCP guidelines recommend follow up 3 years.) Recurrent cystitis like symptoms, has been urology (Dr. Claudia Desanctis) with bladder instillations for possible IC recommended.  She has declined for now. Former smoker, quit 12/29/2018  H/o menorrhagia that seems to have resolved with endometrial ablation  P:   Mammogram guidelines reviewed.  Aware she should start  screening after next birthday Current ASCCP guidelines reviewed.  Current recommendation by ASCCP  follow up pap/HR HPV in 3 years from 2020 testing, so 2023.  Pt has hx of abnormal and this makes her uncomfortable so pap/HR HPV sent per pt request.   She will continue to follow up with Dr. Marlowe Kays.   No blood work obtained today Vaccines UTD. Return annually or prn

## 2019-11-24 ENCOUNTER — Other Ambulatory Visit: Payer: Self-pay

## 2019-11-24 ENCOUNTER — Ambulatory Visit (INDEPENDENT_AMBULATORY_CARE_PROVIDER_SITE_OTHER): Payer: 59 | Admitting: Obstetrics & Gynecology

## 2019-11-24 ENCOUNTER — Encounter: Payer: Self-pay | Admitting: Obstetrics & Gynecology

## 2019-11-24 VITALS — BP 112/70 | HR 84 | Resp 16 | Ht 66.75 in | Wt 223.0 lb

## 2019-11-24 DIAGNOSIS — Z01419 Encounter for gynecological examination (general) (routine) without abnormal findings: Secondary | ICD-10-CM | POA: Diagnosis not present

## 2019-11-24 DIAGNOSIS — Z87410 Personal history of cervical dysplasia: Secondary | ICD-10-CM | POA: Diagnosis not present

## 2019-11-24 DIAGNOSIS — Z124 Encounter for screening for malignant neoplasm of cervix: Secondary | ICD-10-CM | POA: Diagnosis not present

## 2019-11-26 ENCOUNTER — Encounter: Payer: Self-pay | Admitting: Obstetrics & Gynecology

## 2019-11-26 ENCOUNTER — Other Ambulatory Visit (HOSPITAL_COMMUNITY)
Admission: RE | Admit: 2019-11-26 | Discharge: 2019-11-26 | Disposition: A | Discharge status: Home / Self Care | Payer: 59 | Source: Ambulatory Visit | Attending: Obstetrics & Gynecology | Admitting: Obstetrics & Gynecology

## 2019-11-26 DIAGNOSIS — Z124 Encounter for screening for malignant neoplasm of cervix: Secondary | ICD-10-CM | POA: Diagnosis not present

## 2019-11-26 DIAGNOSIS — Z87410 Personal history of cervical dysplasia: Secondary | ICD-10-CM | POA: Diagnosis present

## 2019-11-26 NOTE — Addendum Note (Signed)
Addended by: Megan Salon on: 11/26/2019 06:11 PM   Modules accepted: Orders

## 2019-11-29 ENCOUNTER — Telehealth: Payer: Self-pay

## 2019-11-29 LAB — CYTOLOGY - PAP
Comment: NEGATIVE
Diagnosis: NEGATIVE
High risk HPV: NEGATIVE

## 2019-11-29 NOTE — Telephone Encounter (Signed)
Patient calling for test results. °

## 2019-11-29 NOTE — Telephone Encounter (Signed)
Please see result note.  Patient needs pap recall for 36 months due to her prior hx LGSIL. She also has yeast and can treat if symptomatic - Monistat or Diflucan.

## 2019-11-29 NOTE — Telephone Encounter (Signed)
Left detailed message with negative Pap results. Ok per [employer]. Pt to return call with any additional concerns or questions.  Routing to Dr Quincy Simmonds for review and any additional recommendations for Pap results on 11/26/19.

## 2019-11-30 NOTE — Telephone Encounter (Signed)
Spoke with patient and notified of yeast on pap. Patient states she is not symptomatic. She has a RX for Diflucan she can take if becomes symptomatic.  36 month recall entered

## 2019-12-04 ENCOUNTER — Other Ambulatory Visit: Payer: Self-pay

## 2019-12-04 ENCOUNTER — Inpatient Hospital Stay (HOSPITAL_BASED_OUTPATIENT_CLINIC_OR_DEPARTMENT_OTHER): Payer: 59 | Admitting: Hematology & Oncology

## 2019-12-04 ENCOUNTER — Inpatient Hospital Stay: Payer: 59 | Attending: Hematology & Oncology

## 2019-12-04 VITALS — BP 110/59 | HR 79 | Temp 98.3°F | Resp 18 | Wt 223.0 lb

## 2019-12-04 DIAGNOSIS — D500 Iron deficiency anemia secondary to blood loss (chronic): Secondary | ICD-10-CM | POA: Insufficient documentation

## 2019-12-04 DIAGNOSIS — D72829 Elevated white blood cell count, unspecified: Secondary | ICD-10-CM | POA: Diagnosis not present

## 2019-12-04 DIAGNOSIS — N920 Excessive and frequent menstruation with regular cycle: Secondary | ICD-10-CM | POA: Diagnosis not present

## 2019-12-04 LAB — CMP (CANCER CENTER ONLY)
ALT: 7 U/L (ref 0–44)
AST: 10 U/L — ABNORMAL LOW (ref 15–41)
Albumin: 4.2 g/dL (ref 3.5–5.0)
Alkaline Phosphatase: 59 U/L (ref 38–126)
Anion gap: 9 (ref 5–15)
BUN: 12 mg/dL (ref 6–20)
CO2: 27 mmol/L (ref 22–32)
Calcium: 9.6 mg/dL (ref 8.9–10.3)
Chloride: 103 mmol/L (ref 98–111)
Creatinine: 0.75 mg/dL (ref 0.44–1.00)
GFR, Estimated: >60 mL/min (ref >60)
Glucose, Bld: 102 mg/dL — ABNORMAL HIGH (ref 70–99)
Potassium: 3.7 mmol/L (ref 3.5–5.1)
Sodium: 139 mmol/L (ref 135–145)
Total Bilirubin: 0.3 mg/dL (ref 0.3–1.2)
Total Protein: 7.1 g/dL (ref 6.5–8.1)

## 2019-12-04 LAB — CBC WITH DIFFERENTIAL (CANCER CENTER ONLY)
Abs Immature Granulocytes: 0.02 10*3/uL (ref 0.00–0.07)
Basophils Absolute: 0.1 10*3/uL (ref 0.0–0.1)
Basophils Relative: 1 %
Eosinophils Absolute: 0.1 10*3/uL (ref 0.0–0.5)
Eosinophils Relative: 1 %
HCT: 43.8 % (ref 36.0–46.0)
Hemoglobin: 14.2 g/dL (ref 12.0–15.0)
Immature Granulocytes: 0 %
Lymphocytes Relative: 35 %
Lymphs Abs: 3 10*3/uL (ref 0.7–4.0)
MCH: 27.6 pg (ref 26.0–34.0)
MCHC: 32.4 g/dL (ref 30.0–36.0)
MCV: 85 fL (ref 80.0–100.0)
Monocytes Absolute: 0.6 10*3/uL (ref 0.1–1.0)
Monocytes Relative: 7 %
Neutro Abs: 4.6 10*3/uL (ref 1.7–7.7)
Neutrophils Relative %: 56 %
Platelet Count: 351 10*3/uL (ref 150–400)
RBC: 5.15 MIL/uL — ABNORMAL HIGH (ref 3.87–5.11)
RDW: 13.3 % (ref 11.5–15.5)
WBC Count: 8.4 10*3/uL (ref 4.0–10.5)
nRBC: 0 % (ref 0.0–0.2)

## 2019-12-04 LAB — RETICULOCYTES
Immature Retic Fract: 3.3 % (ref 2.3–15.9)
RBC.: 5.19 MIL/uL — ABNORMAL HIGH (ref 3.87–5.11)
Retic Count, Absolute: 63.8 10*3/uL (ref 19.0–186.0)
Retic Ct Pct: 1.2 % (ref 0.4–3.1)

## 2019-12-04 NOTE — Progress Notes (Signed)
Hematology and Oncology Follow Up Visit  Betty Leonard [id number] [birth year] 39 y.o. 12/04/2019   Principle Diagnosis:  Iron deficiency anemia secondary to heavy cycles Mild leukocytosis - reactive, endometriosis   Current Therapy:  IV Iron -- Venofer given in 10/2019   Interim History:  [name] is here today for follow-up.  She still feels fatigue.  She got iron last time we saw her.  Her iron saturation was only 14%.  She has had the iron works for a couple weeks and then she gets fatigued again.  I am not sure what is really going on.  I do not know she is losing iron through monthly cycles.  I think that she has had uterine ablation.  I will check her thyroid studies.  These are okay.  She has had no problems with weight loss or weight gain.  She is not all that active.  She works.  I think she works from home.  She has had no issues with nausea or vomiting.  Is been no change in bowel or bladder habits.  If her iron is still on the low side, we may consider some iron dextran on her to see if this may he was a little bit more of a lengthy period of affect.  Currently, I would say performance status is ECOG 1.     Allergies as of 12/04/2019      Reactions   Codeine Shortness Of Breath   Hydrocodone Shortness Of Breath   Iodine Other (See Comments)   Shellfish allergy   Shellfish Allergy Anaphylaxis   Aspirin Hives      Medication List       Accurate as of December 04, 2019  4:07 PM. If you have any questions, ask your nurse or doctor.        STOP taking these medications   buPROPion 150 MG 24 hr tablet Commonly known as: WELLBUTRIN XL Stopped by: [name], MD     TAKE these medications   Belsomra 10 MG Tabs Generic drug: Suvorexant Take 1 tablet by mouth at bedtime as needed.   EPINEPHrine 0.3 mg/0.3 mL Soaj injection Commonly known as: EPI-PEN Inject 0.3 mg into the muscle once.   fexofenadine 180 MG tablet Commonly known as: ALLEGRA Take  180 mg by mouth daily.   ibuprofen 800 MG tablet Commonly known as: ADVIL Take 1 tablet (800 mg total) by mouth every 8 (eight) hours as needed.       Allergies:  Allergies  Allergen Reactions  . Codeine Shortness Of Breath  . Hydrocodone Shortness Of Breath  . Iodine Other (See Comments)    Shellfish allergy  . Shellfish Allergy Anaphylaxis  . Aspirin Hives    Past Medical History, Surgical history, Social history, and Family History were reviewed and updated.  Review of Systems: Review of Systems  Constitutional: Negative.   Eyes: Negative.   Respiratory: Negative.   Cardiovascular: Negative.   Gastrointestinal: Negative.   Genitourinary: Negative.   Musculoskeletal: Negative.   Skin: Negative.   Neurological: Positive for headaches.  Endo/Heme/Allergies: Negative.   Psychiatric/Behavioral: Negative.     Physical Exam:  weight is 223 lb (101.2 kg). Her oral temperature is 98.3 F (36.8 C). Her blood pressure is 110/59 (abnormal) and her pulse is 79. Her respiration is 18 and oxygen saturation is 100%.   Wt Readings from Last 3 Encounters:  12/04/19 223 lb (101.2 kg)  11/24/19 223 lb (101.2 kg)  10/04/19 220 lb 8  oz (100 kg)    Physical Exam Vitals reviewed.  HENT:     Head: Normocephalic and atraumatic.  Eyes:     Pupils: Pupils are equal, round, and reactive to light.  Cardiovascular:     Rate and Rhythm: Normal rate and regular rhythm.     Heart sounds: Normal heart sounds.  Pulmonary:     Effort: Pulmonary effort is normal.     Breath sounds: Normal breath sounds.  Abdominal:     General: Bowel sounds are normal.     Palpations: Abdomen is soft.  Musculoskeletal:        General: No tenderness or deformity. Normal range of motion.     Cervical back: Normal range of motion.  Lymphadenopathy:     Cervical: No cervical adenopathy.  Skin:    General: Skin is warm and dry.     Findings: No erythema or rash.  Neurological:     Mental Status: She is  alert and oriented to person, place, and time.  Psychiatric:        Behavior: Behavior normal.        Thought Content: Thought content normal.        Judgment: Judgment normal.     Lab Results  Component Value Date   WBC 8.4 12/04/2019   HGB 14.2 12/04/2019   HCT 43.8 12/04/2019   MCV 85.0 12/04/2019   PLT 351 12/04/2019   Lab Results  Component Value Date   FERRITIN 183 10/04/2019   IRON 37 (L) 10/04/2019   TIBC 265 10/04/2019   UIBC 228 10/04/2019   IRONPCTSAT 14 (L) 10/04/2019   Lab Results  Component Value Date   RETICCTPCT 1.2 12/04/2019   RBC 5.15 (H) 12/04/2019   RBC 5.19 (H) 12/04/2019   No results found for: Nils Pyle [facility] Lab Results  Component Value Date   IGGSERUM 1,067 06/06/2018   IGMSERUM 244 (H) 06/06/2018   No results found for: Odetta Pink, SPEI   Chemistry      Component Value Date/Time   NA 139 12/04/2019 1453   K 3.7 12/04/2019 1453   CL 103 12/04/2019 1453   CO2 27 12/04/2019 1453   BUN 12 12/04/2019 1453   CREATININE 0.75 12/04/2019 1453      Component Value Date/Time   CALCIUM 9.6 12/04/2019 1453   ALKPHOS 59 12/04/2019 1453   AST 10 (L) 12/04/2019 1453   ALT 7 12/04/2019 1453   BILITOT 0.3 12/04/2019 1453       Impression and Plan: [name] is a very pleasant 39 yo caucasian female with history of iron deficiency anemia secondary to heavy cycles.  Again, she has had uterine ablation.  Of note, her leukocytosis no longer present.  Her MCV is still about the same.  It would be hard to say if she is iron deficient.  It would not surprise me if she was still iron deficient.  Last time I saw her was 2 months ago.  We will see what the iron studies are.  This will help dictate when she comes back to see [place].     [name], MD 11/15/20214:07 PM

## 2019-12-05 ENCOUNTER — Telehealth: Payer: Self-pay

## 2019-12-05 ENCOUNTER — Telehealth: Payer: Self-pay | Admitting: *Deleted

## 2019-12-05 LAB — IRON AND TIBC
Iron: 60 ug/dL (ref 41–142)
Saturation Ratios: 24 % (ref 21–57)
TIBC: 248 ug/dL (ref 236–444)
UIBC: 187 ug/dL (ref 120–384)

## 2019-12-05 LAB — FERRITIN: Ferritin: 403 ng/mL — ABNORMAL HIGH (ref 11–307)

## 2019-12-05 NOTE — Telephone Encounter (Signed)
-----   Message from [name], MD sent at 12/05/2019 10:51 AM EST ----- Call - the iron level is ok.  You do not need IV Iron.  I am not sure why you feel so tired??  Laurey Arrow

## 2019-12-05 NOTE — Telephone Encounter (Signed)
Notified pt of results. Pt verbalized understanding, no further concerns.

## 2019-12-05 NOTE — Telephone Encounter (Signed)
S/w pt per 11/15/ los and she is aware of her appts 01/09/20.... AOM

## 2019-12-06 ENCOUNTER — Encounter: Payer: Self-pay | Admitting: Dietician

## 2019-12-06 ENCOUNTER — Encounter: Payer: 59 | Attending: Urology | Admitting: Dietician

## 2019-12-06 ENCOUNTER — Other Ambulatory Visit: Payer: Self-pay

## 2019-12-06 DIAGNOSIS — E669 Obesity, unspecified: Secondary | ICD-10-CM | POA: Diagnosis present

## 2019-12-06 NOTE — Progress Notes (Signed)
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 Primary concerns today include weight management and UTI/ bladder pain. States she has tried [employer] and [employer] for [employer]. Typical meal pattern is snacking throughout the day. States she rarely has a structured meal and will cook at home sometimes but may not eat what she cooks. States she prefers things that are ready to grab and eat. Does not care for red meat or most meats in general, so we discussed non-meat sources of protein. States she really likes cheese. Lately avoiding tomato based products and is allergic to shellfish. Drinks lots of coffee and water throughout the day. States she often doesn't have breakfast but will drink coffee instead, and may have Fairlife protein shake with it.   24-Hr Dietary Recall First Meal: - Snack: [employer] Triple Zero yogurt  Second Meal: edamame  Snack: Nutri-Grain bar Third Meal: tempeh  Snack: - Beverages: coffee, almond milk, water, [employer] Energy fruit, Fairlife protein shake    NUTRITION DIAGNOSIS  Food and nutrition related knowledge deficit (NB-1.1) related to lack of prior nutrition education as evidenced by questions raised regarding proper nutrition for urinary tract health and weight management.    NUTRITION INTERVENTION  Nutrition education (E-1) on the following topics:   General balanced nutrition for weight management including the importance of drinking  lots of water for hydration, including protein with meals and snacks, focusing on fiber for satiety and heart health, and including vegetables/complex carbohydrates/protein with meals for balance.   UTI nutrition therapy which includes berry juices (such as cranberry) and yogurt with live active cultures for probiotic benefits and drinking lots of water.   Handouts Provided Include   UTI Nutrition Therapy   Meal Ideas  Breakfast Ideas  Balanced Snacks   Learning Style & Readiness for Change Teaching method utilized: Visual & Auditory  Demonstrated degree of understanding via: Teach Back  Barriers to learning/adherence to lifestyle change: None Identified    MONITORING & EVALUATION Dietary intake, weekly physical activity, and goals PRN.  Next Steps  Patient is to contact [facility] for follow up or with questions/concerns as needed.

## 2019-12-08 IMAGING — DX DG SACRUM/COCCYX 2+V
3 series · 3 of 3 positions shown · non-contrast
Comparison: None.

CLINICAL DATA: Buttock pain after falling in an elevator 3 days ago

EXAM:
SACRUM AND COCCYX - 2+ VIEW

[coccyx ap]
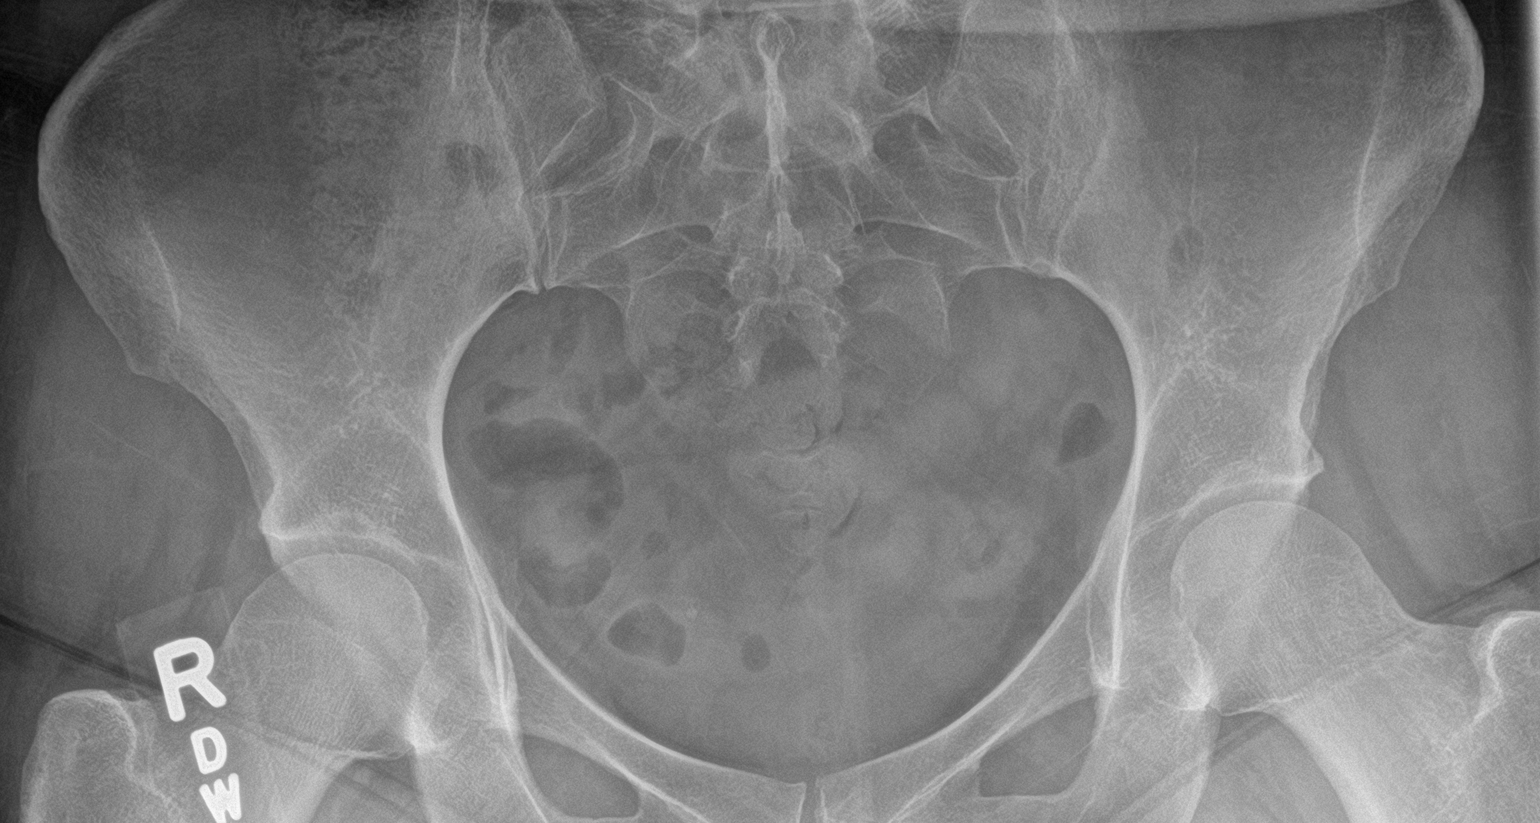

[sacrum ap]
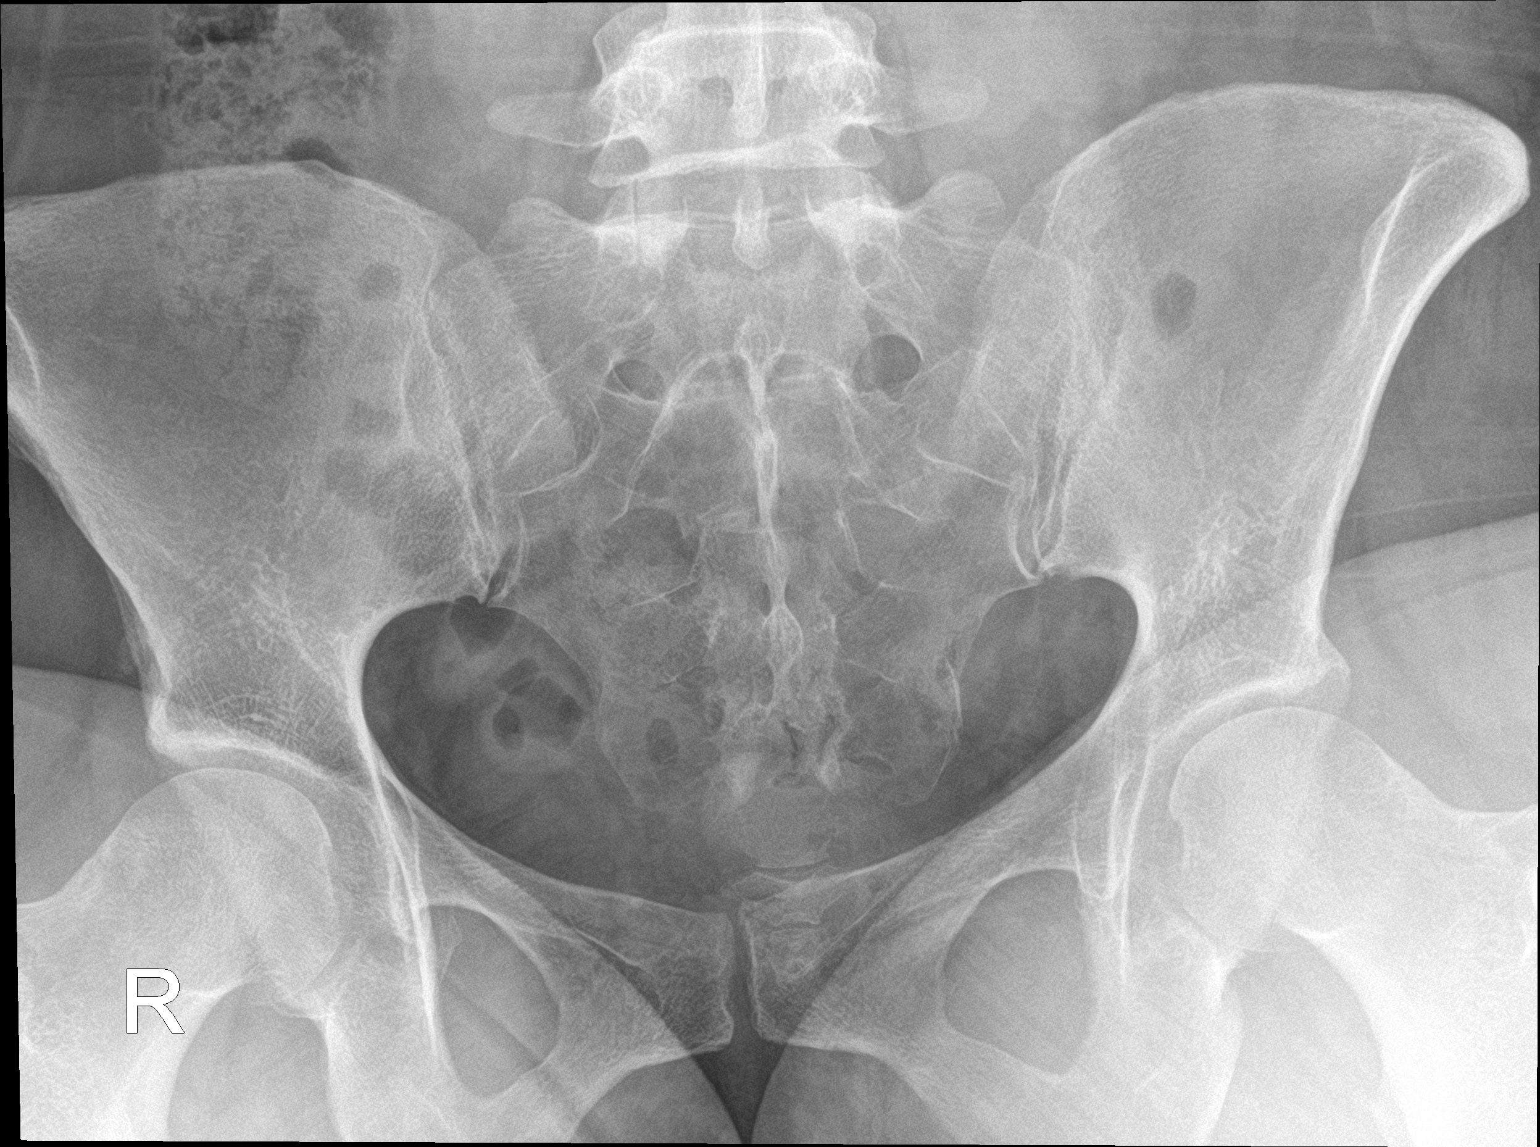

[sacrum lat]
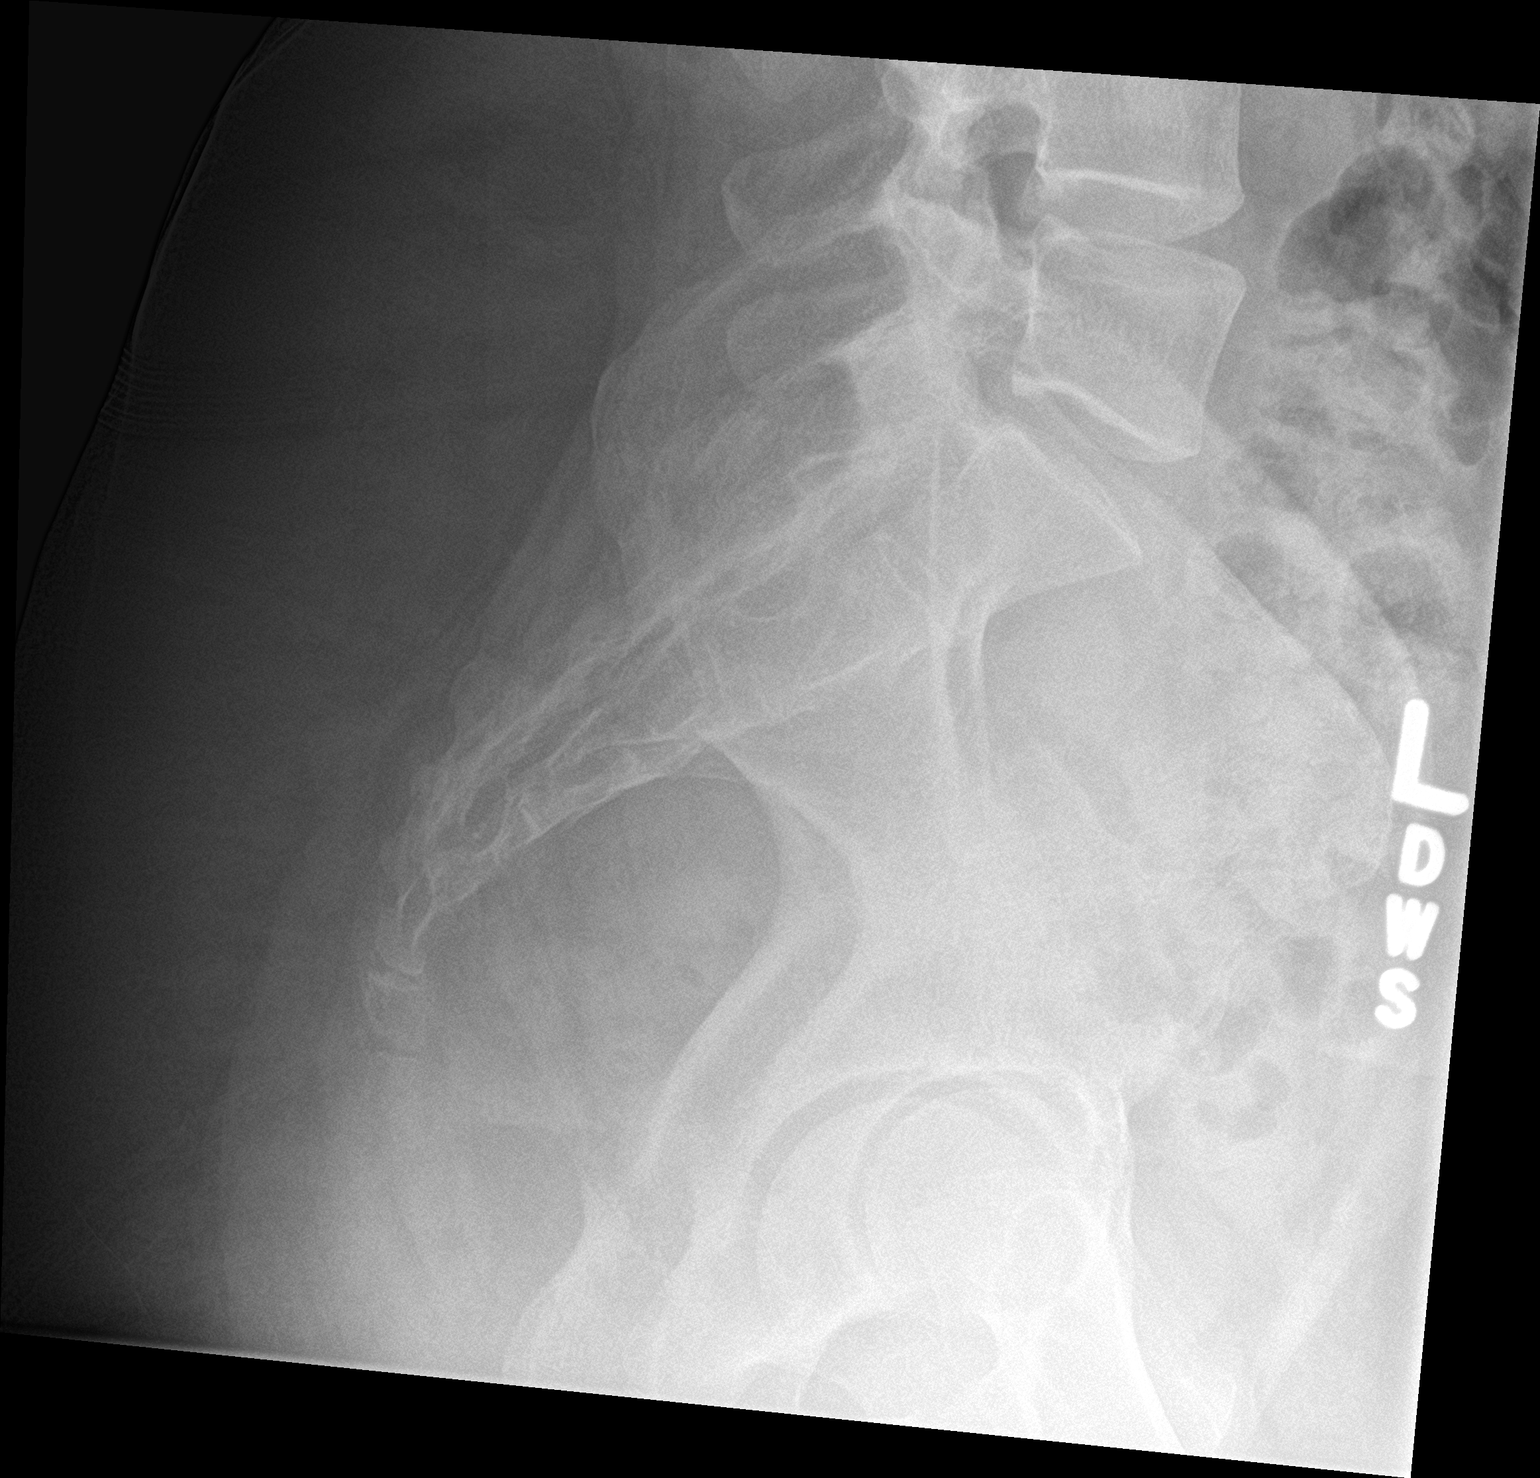

[3 of 3 positions shown; findings below may reference images not displayed]

FINDINGS: There is no evidence of acute sacrococcygeal fracture. The
sacroiliac joints and symphysis pubis are intact..
IMPRESSION: No evidence of acute sacrococcygeal injury.

## 2020-01-09 ENCOUNTER — Other Ambulatory Visit: Payer: Self-pay

## 2020-01-09 ENCOUNTER — Telehealth: Payer: Self-pay

## 2020-01-09 ENCOUNTER — Inpatient Hospital Stay (HOSPITAL_BASED_OUTPATIENT_CLINIC_OR_DEPARTMENT_OTHER): Payer: 59 | Admitting: Hematology & Oncology

## 2020-01-09 ENCOUNTER — Telehealth: Payer: Self-pay | Admitting: *Deleted

## 2020-01-09 ENCOUNTER — Inpatient Hospital Stay: Payer: 59 | Attending: Hematology & Oncology

## 2020-01-09 ENCOUNTER — Encounter: Payer: Self-pay | Admitting: Hematology & Oncology

## 2020-01-09 VITALS — BP 123/69 | HR 99 | Temp 97.9°F | Resp 18 | Wt 225.5 lb

## 2020-01-09 DIAGNOSIS — N920 Excessive and frequent menstruation with regular cycle: Secondary | ICD-10-CM | POA: Insufficient documentation

## 2020-01-09 DIAGNOSIS — D72829 Elevated white blood cell count, unspecified: Secondary | ICD-10-CM | POA: Diagnosis not present

## 2020-01-09 DIAGNOSIS — D500 Iron deficiency anemia secondary to blood loss (chronic): Secondary | ICD-10-CM | POA: Insufficient documentation

## 2020-01-09 DIAGNOSIS — D510 Vitamin B12 deficiency anemia due to intrinsic factor deficiency: Secondary | ICD-10-CM

## 2020-01-09 LAB — CMP (CANCER CENTER ONLY)
ALT: 16 U/L (ref 0–44)
AST: 12 U/L — ABNORMAL LOW (ref 15–41)
Albumin: 4.2 g/dL (ref 3.5–5.0)
Alkaline Phosphatase: 55 U/L (ref 38–126)
Anion gap: 7 (ref 5–15)
BUN: 12 mg/dL (ref 6–20)
CO2: 28 mmol/L (ref 22–32)
Calcium: 9.7 mg/dL (ref 8.9–10.3)
Chloride: 104 mmol/L (ref 98–111)
Creatinine: 0.66 mg/dL (ref 0.44–1.00)
GFR, Estimated: >60 mL/min (ref >60)
Glucose, Bld: 91 mg/dL (ref 70–99)
Potassium: 4.1 mmol/L (ref 3.5–5.1)
Sodium: 139 mmol/L (ref 135–145)
Total Bilirubin: 0.6 mg/dL (ref 0.3–1.2)
Total Protein: 7.1 g/dL (ref 6.5–8.1)

## 2020-01-09 LAB — CBC WITH DIFFERENTIAL (CANCER CENTER ONLY)
Abs Immature Granulocytes: 0.03 10*3/uL (ref 0.00–0.07)
Basophils Absolute: 0.1 10*3/uL (ref 0.0–0.1)
Basophils Relative: 1 %
Eosinophils Absolute: 0.2 10*3/uL (ref 0.0–0.5)
Eosinophils Relative: 3 %
HCT: 43.4 % (ref 36.0–46.0)
Hemoglobin: 13.8 g/dL (ref 12.0–15.0)
Immature Granulocytes: 0 %
Lymphocytes Relative: 33 %
Lymphs Abs: 2.5 10*3/uL (ref 0.7–4.0)
MCH: 27 pg (ref 26.0–34.0)
MCHC: 31.8 g/dL (ref 30.0–36.0)
MCV: 84.8 fL (ref 80.0–100.0)
Monocytes Absolute: 0.7 10*3/uL (ref 0.1–1.0)
Monocytes Relative: 9 %
Neutro Abs: 4.1 10*3/uL (ref 1.7–7.7)
Neutrophils Relative %: 54 %
Platelet Count: 333 10*3/uL (ref 150–400)
RBC: 5.12 MIL/uL — ABNORMAL HIGH (ref 3.87–5.11)
RDW: 13.2 % (ref 11.5–15.5)
WBC Count: 7.6 10*3/uL (ref 4.0–10.5)
nRBC: 0 % (ref 0.0–0.2)

## 2020-01-09 LAB — IRON AND TIBC
Iron: 133 ug/dL (ref 41–142)
Saturation Ratios: 54 % (ref 21–57)
TIBC: 244 ug/dL (ref 236–444)
UIBC: 111 ug/dL — ABNORMAL LOW (ref 120–384)

## 2020-01-09 LAB — FERRITIN: Ferritin: 355 ng/mL — ABNORMAL HIGH (ref 11–307)

## 2020-01-09 LAB — TSH: TSH: 0.861 u[IU]/mL (ref 0.308–3.960)

## 2020-01-09 LAB — VITAMIN B12: Vitamin B-12: 402 pg/mL (ref 180–914)

## 2020-01-09 NOTE — Telephone Encounter (Signed)
Notified pt of results. Pt verbalized understanding.no concerns at this time. 

## 2020-01-09 NOTE — Telephone Encounter (Signed)
Patient sent a message to nurse that she req to see Dr Elisha Headland in march and not Judson Roch, appt moved and pt called with new appt.... AOM

## 2020-01-09 NOTE — Telephone Encounter (Signed)
appts made and printed per 01/09/20 los... AOM

## 2020-01-09 NOTE — Progress Notes (Signed)
Hematology and Oncology Follow Up Visit  Betty Leonard [id number] September 10, 1980 39 y.o. 01/09/2020   Principle Diagnosis:  Iron deficiency anemia secondary to heavy cycles Mild leukocytosis - reactive, endometriosis   Current Therapy:  IV Iron -- Venofer given in 10/2019   Interim History:  Betty Leonard is here today for follow-up.  She still feels fatigue.  We last saw her in November, her ferritin was 403 with an iron saturation of 24%.  She drinks coffee.  This seems to help her with her fatigue.  She has had no problems with bleeding.  She has had uterine ablation.  We have checked her thyroid studies.  They have been okay.  We will check her vitamin B12 and see what that looks like.  She has had no change in bowel or bladder habits.  Her appetite has been okay.  Overall, her performance status is ECOG 1.     Allergies as of 01/09/2020      Reactions   Codeine Shortness Of Breath   Hydrocodone Shortness Of Breath   Iodine Other (See Comments)   Shellfish allergy   Shellfish Allergy Anaphylaxis   Aspirin Hives      Medication List       Accurate as of January 09, 2020  8:03 AM. If you have any questions, ask your nurse or doctor.        Belsomra 10 MG Tabs Generic drug: Suvorexant Take 1 tablet by mouth at bedtime as needed.   EPINEPHrine 0.3 mg/0.3 mL Soaj injection Commonly known as: EPI-PEN Inject 0.3 mg into the muscle once.   fexofenadine 180 MG tablet Commonly known as: ALLEGRA Take 180 mg by mouth daily.   ibuprofen 800 MG tablet Commonly known as: ADVIL Take 1 tablet (800 mg total) by mouth every 8 (eight) hours as needed.       Allergies:  Allergies  Allergen Reactions  . Codeine Shortness Of Breath  . Hydrocodone Shortness Of Breath  . Iodine Other (See Comments)    Shellfish allergy  . Shellfish Allergy Anaphylaxis  . Aspirin Hives    Past Medical History, Surgical history, Social history, and Family History were reviewed and  updated.  Review of Systems: Review of Systems  Constitutional: Negative.   Eyes: Negative.   Respiratory: Negative.   Cardiovascular: Negative.   Gastrointestinal: Negative.   Genitourinary: Negative.   Musculoskeletal: Negative.   Skin: Negative.   Neurological: Positive for headaches.  Endo/Heme/Allergies: Negative.   Psychiatric/Behavioral: Negative.     Physical Exam:  weight is 225 lb 8 oz (102.3 kg). Her oral temperature is 97.9 F (36.6 C). Her blood pressure is 123/69 and her pulse is 99. Her respiration is 18 and oxygen saturation is 100%.   Wt Readings from Last 3 Encounters:  01/09/20 225 lb 8 oz (102.3 kg)  12/06/19 223 lb (101.2 kg)  12/04/19 223 lb (101.2 kg)    Physical Exam Vitals reviewed.  HENT:     Head: Normocephalic and atraumatic.  Eyes:     Pupils: Pupils are equal, round, and reactive to light.  Cardiovascular:     Rate and Rhythm: Normal rate and regular rhythm.     Heart sounds: Normal heart sounds.  Pulmonary:     Effort: Pulmonary effort is normal.     Breath sounds: Normal breath sounds.  Abdominal:     General: Bowel sounds are normal.     Palpations: Abdomen is soft.  Musculoskeletal:  General: No tenderness or deformity. Normal range of motion.     Cervical back: Normal range of motion.  Lymphadenopathy:     Cervical: No cervical adenopathy.  Skin:    General: Skin is warm and dry.     Findings: No erythema or rash.  Neurological:     Mental Status: She is alert and oriented to person, place, and time.  Psychiatric:        Behavior: Behavior normal.        Thought Content: Thought content normal.        Judgment: Judgment normal.     Lab Results  Component Value Date   WBC 7.6 01/09/2020   HGB 13.8 01/09/2020   HCT 43.4 01/09/2020   MCV 84.8 01/09/2020   PLT 333 01/09/2020   Lab Results  Component Value Date   FERRITIN 403 (H) 12/04/2019   IRON 60 12/04/2019   TIBC 248 12/04/2019   UIBC 187 12/04/2019    IRONPCTSAT 24 12/04/2019   Lab Results  Component Value Date   RETICCTPCT 1.2 12/04/2019   RBC 5.12 (H) 01/09/2020   No results found for: Nils Pyle [facility] Lab Results  Component Value Date   IGGSERUM 1,067 06/06/2018   IGMSERUM 244 (H) 06/06/2018   No results found for: Odetta Pink, SPEI   Chemistry      Component Value Date/Time   NA 139 12/04/2019 1453   K 3.7 12/04/2019 1453   CL 103 12/04/2019 1453   CO2 27 12/04/2019 1453   BUN 12 12/04/2019 1453   CREATININE 0.75 12/04/2019 1453      Component Value Date/Time   CALCIUM 9.6 12/04/2019 1453   ALKPHOS 59 12/04/2019 1453   AST 10 (L) 12/04/2019 1453   ALT 7 12/04/2019 1453   BILITOT 0.3 12/04/2019 1453       Impression and Plan: [name] is a very pleasant 39 yo caucasian female with history of iron deficiency anemia secondary to heavy cycles.  Again, she has had uterine ablation.  I just feel bad for her.  I just am not sure why she does not feel all that good.  We will see what her vitamin B-12 level is.  We will plan to get her back in March.  We will get her through the winter.  She will be with her brother for Christmas.  They will have a good time together watching a [employer] marathon.   [name], MD 12/21/20218:03 AM

## 2020-01-09 NOTE — Telephone Encounter (Signed)
-----   Message from [name], MD sent at 01/09/2020  1:56 PM EST ----- Call - the iron, thyroid and B12 levels are all ok!!!  Betty Leonard

## 2020-03-07 ENCOUNTER — Telehealth: Payer: Self-pay | Admitting: *Deleted

## 2020-03-07 ENCOUNTER — Other Ambulatory Visit: Payer: Self-pay | Admitting: *Deleted

## 2020-03-07 DIAGNOSIS — D500 Iron deficiency anemia secondary to blood loss (chronic): Secondary | ICD-10-CM

## 2020-03-07 NOTE — Telephone Encounter (Signed)
Call received from patient that she is experiencing symptoms of anemia again such as "headaches and bruising".  Doesn't see Dr Marin Olp until March.  Requests labwork earlier.  Ok with Dr Marin Olp to come in earlier for [facility]

## 2020-03-08 ENCOUNTER — Other Ambulatory Visit: Payer: Self-pay

## 2020-03-08 ENCOUNTER — Inpatient Hospital Stay: Payer: 59 | Attending: Hematology & Oncology

## 2020-03-08 DIAGNOSIS — D500 Iron deficiency anemia secondary to blood loss (chronic): Secondary | ICD-10-CM | POA: Diagnosis present

## 2020-03-08 DIAGNOSIS — N920 Excessive and frequent menstruation with regular cycle: Secondary | ICD-10-CM | POA: Insufficient documentation

## 2020-03-08 LAB — CBC WITH DIFFERENTIAL (CANCER CENTER ONLY)
Abs Immature Granulocytes: 0.03 10*3/uL (ref 0.00–0.07)
Basophils Absolute: 0.1 10*3/uL (ref 0.0–0.1)
Basophils Relative: 1 %
Eosinophils Absolute: 0.2 10*3/uL (ref 0.0–0.5)
Eosinophils Relative: 2 %
HCT: 44.6 % (ref 36.0–46.0)
Hemoglobin: 14.4 g/dL (ref 12.0–15.0)
Immature Granulocytes: 0 %
Lymphocytes Relative: 26 %
Lymphs Abs: 2.8 10*3/uL (ref 0.7–4.0)
MCH: 27.5 pg (ref 26.0–34.0)
MCHC: 32.3 g/dL (ref 30.0–36.0)
MCV: 85.3 fL (ref 80.0–100.0)
Monocytes Absolute: 0.9 10*3/uL (ref 0.1–1.0)
Monocytes Relative: 9 %
Neutro Abs: 6.7 10*3/uL (ref 1.7–7.7)
Neutrophils Relative %: 62 %
Platelet Count: 369 10*3/uL (ref 150–400)
RBC: 5.23 MIL/uL — ABNORMAL HIGH (ref 3.87–5.11)
RDW: 13.4 % (ref 11.5–15.5)
WBC Count: 10.7 10*3/uL — ABNORMAL HIGH (ref 4.0–10.5)
nRBC: 0 % (ref 0.0–0.2)

## 2020-03-08 LAB — COMPREHENSIVE METABOLIC PANEL
ALT: 10 U/L (ref 0–44)
AST: 9 U/L — ABNORMAL LOW (ref 15–41)
Albumin: 4.5 g/dL (ref 3.5–5.0)
Alkaline Phosphatase: 52 U/L (ref 38–126)
Anion gap: 9 (ref 5–15)
BUN: 21 mg/dL — ABNORMAL HIGH (ref 6–20)
CO2: 29 mmol/L (ref 22–32)
Calcium: 10.2 mg/dL (ref 8.9–10.3)
Chloride: 107 mmol/L (ref 98–111)
Creatinine, Ser: 0.86 mg/dL (ref 0.44–1.00)
GFR, Estimated: >60 mL/min (ref >60)
Glucose, Bld: 116 mg/dL — ABNORMAL HIGH (ref 70–99)
Potassium: 4.9 mmol/L (ref 3.5–5.1)
Sodium: 145 mmol/L (ref 135–145)
Total Bilirubin: 0.5 mg/dL (ref 0.3–1.2)
Total Protein: 7.5 g/dL (ref 6.5–8.1)

## 2020-03-11 ENCOUNTER — Encounter: Payer: Self-pay | Admitting: *Deleted

## 2020-03-11 LAB — IRON AND TIBC
Iron: 84 ug/dL (ref 41–142)
Saturation Ratios: 32 % (ref 21–57)
TIBC: 268 ug/dL (ref 236–444)
UIBC: 183 ug/dL (ref 120–384)

## 2020-03-11 LAB — FERRITIN: Ferritin: 358 ng/mL — ABNORMAL HIGH (ref 11–307)

## 2020-04-08 ENCOUNTER — Ambulatory Visit: Payer: 59 | Admitting: Family

## 2020-04-08 ENCOUNTER — Other Ambulatory Visit: Payer: 59

## 2020-04-10 ENCOUNTER — Other Ambulatory Visit: Payer: 59

## 2020-04-10 ENCOUNTER — Ambulatory Visit: Payer: 59 | Admitting: Hematology & Oncology

## 2020-06-24 ENCOUNTER — Ambulatory Visit (INDEPENDENT_AMBULATORY_CARE_PROVIDER_SITE_OTHER): Payer: 59

## 2020-06-24 ENCOUNTER — Other Ambulatory Visit: Payer: Self-pay

## 2020-06-24 ENCOUNTER — Ambulatory Visit (INDEPENDENT_AMBULATORY_CARE_PROVIDER_SITE_OTHER): Payer: 59 | Admitting: Podiatry

## 2020-06-24 DIAGNOSIS — M7751 Other enthesopathy of right foot: Secondary | ICD-10-CM

## 2020-06-24 MED ORDER — METHYLPREDNISOLONE 4 MG PO TBPK: ORAL_TABLET | ORAL | 0 refills | Status: DC

## 2020-06-24 MED ORDER — MELOXICAM 15 MG PO TABS: 15.0000 mg | ORAL_TABLET | Freq: Every day | ORAL | 1 refills | Status: DC

## 2020-06-24 MED ORDER — BETAMETHASONE SOD PHOS & ACET 6 (3-3) MG/ML IJ SUSP
3.0000 mg | Freq: Once | INTRAMUSCULAR | Status: AC
  Administered 2020-06-24: 3 mg via INTRA_ARTICULAR

## 2020-06-24 NOTE — Progress Notes (Signed)
   HPI: 40 y.o. female presenting today for new complaint regarding pain is been going on for approximately 3-4 weeks now to the right foot.  Patient is concerned that is possibly the area where she had surgery.  She has pain and tenderness to the right forefoot.  She denies a history of injury.  She has been very active and hiking and doing well since her surgery in 2020 up until about 3 weeks ago.  She presents for further treatment evaluation.  She has not done anything for treatment currently  Past Medical History:  Diagnosis Date  . Anemia    iron infusion sept 2020  . Chicken pox   . Depression   . Esophagitis 12/24/2011  . GERD (gastroesophageal reflux disease)   . Menorrhagia    with irregular cycles  . Migraines   . Pneumonia 07/2018  . Seasonal allergies   . UTI (urinary tract infection) finished antibiotic 04-11-2019     Physical Exam: General: The patient is alert and oriented x3 in no acute distress.  Dermatology: Skin is warm, dry and supple bilateral lower extremities. Negative for open lesions or macerations.  Vascular: Palpable pedal pulses bilaterally. No edema or erythema noted. Capillary refill within normal limits.  Neurological: Epicritic and protective threshold grossly intact bilaterally.   Musculoskeletal Exam: Range of motion within normal limits to all pedal and ankle joints bilateral. Muscle strength 5/5 in all groups bilateral.  There is pain on palpation and range of motion to the fourth MTPJ of the right foot  Radiographic Exam:  Normal osseous mineralization. Joint spaces preserved. No fracture/dislocation/boney destruction.  Orthopedic hardware noted to the base of the first metatarsal as well as the distal head of the second metatarsal.  Absence of the third metatarsal head noted  Assessment: 1.  History of prior foot surgery right 2.  Fourth MTPJ capsulitis right   Plan of Care:  1. Patient evaluated. X-Rays reviewed.  2.  Injection of 0.5 cc  Celestone Soluspan injected in the fourth MTPJ right foot 3.  Prescription for a Medrol Dosepak 4.  Prescription for meloxicam 15 mg daily after completion of the Dosepak 5.  Cam boot dispensed.  Weightbearing as tolerated daily x3 weeks 6.  Return to clinic in 3 weeks      Edrick Kins, DPM [facility]  Dr. Edrick Kins, DPM    [address], [place] [address]                Office [phone]  Fax [phone]

## 2020-07-24 ENCOUNTER — Other Ambulatory Visit: Payer: Self-pay

## 2020-07-24 ENCOUNTER — Ambulatory Visit (INDEPENDENT_AMBULATORY_CARE_PROVIDER_SITE_OTHER): Payer: 59 | Admitting: Podiatry

## 2020-07-24 DIAGNOSIS — M7751 Other enthesopathy of right foot: Secondary | ICD-10-CM | POA: Diagnosis not present

## 2020-07-24 NOTE — Progress Notes (Signed)
   HPI: 40 y.o. female presenting today  for follow-up evaluation of fourth MTPJ capsulitis to the right foot.  Patient states she continues to have pain and sensitivity to the area.  The way she walks puts a lot of pressure on the balls of her feet.  She has had orthotics in the past which have helped significantly.  This was about 5 years ago.  She presents for further treatment and evaluation  Past Medical History:  Diagnosis Date   Anemia    iron infusion sept 2020   Chicken pox    Depression    Esophagitis 12/24/2011   GERD (gastroesophageal reflux disease)    Menorrhagia    with irregular cycles   Migraines    Pneumonia 07/2018   Seasonal allergies    UTI (urinary tract infection) finished antibiotic 04-11-2019     Physical Exam: General: The patient is alert and oriented x3 in no acute distress.  Dermatology: Skin is warm, dry and supple bilateral lower extremities. Negative for open lesions or macerations.  Vascular: Palpable pedal pulses bilaterally. No edema or erythema noted. Capillary refill within normal limits.  Neurological: Epicritic and protective threshold grossly intact bilaterally.   Musculoskeletal Exam: Range of motion within normal limits to all pedal and ankle joints bilateral. Muscle strength 5/5 in all groups bilateral.  There continues to be some tenderness to palpation of the fourth MTPJ of the right foot   Assessment: 1.  Fourth MTPJ capsulitis right foot 2. H/o third metatarsal head resection right foot   Plan of Care:  1. Patient evaluated.  2.  Discontinue cam boot 3.  Offloading felt metatarsal pads were provided to the applied to the insoles of the shoes 4.  I do believe the patient would benefit from orthotics with metatarsal pads built in to offload pressure from the ball of the foot bilateral 5.  Appointment with Pedorthist for custom molded orthotics 6.  Return to clinic as needed      Edrick Kins, DPM [facility]  Dr. Edrick Kins, DPM    [address], [place] [address]                Office [phone]  Fax [phone]

## 2020-08-14 ENCOUNTER — Inpatient Hospital Stay: Payer: 59 | Attending: Physician Assistant | Admitting: Physician Assistant

## 2020-08-14 ENCOUNTER — Encounter: Payer: Self-pay | Admitting: Physician Assistant

## 2020-08-14 ENCOUNTER — Other Ambulatory Visit: Payer: Self-pay

## 2020-08-14 ENCOUNTER — Inpatient Hospital Stay: Payer: 59

## 2020-08-14 VITALS — BP 116/71 | HR 68 | Temp 98.7°F | Resp 17 | Ht 66.75 in | Wt 222.0 lb

## 2020-08-14 DIAGNOSIS — R519 Headache, unspecified: Secondary | ICD-10-CM | POA: Insufficient documentation

## 2020-08-14 DIAGNOSIS — R7989 Other specified abnormal findings of blood chemistry: Secondary | ICD-10-CM

## 2020-08-14 DIAGNOSIS — R110 Nausea: Secondary | ICD-10-CM | POA: Diagnosis not present

## 2020-08-14 DIAGNOSIS — R5382 Chronic fatigue, unspecified: Secondary | ICD-10-CM | POA: Insufficient documentation

## 2020-08-14 DIAGNOSIS — E611 Iron deficiency: Secondary | ICD-10-CM | POA: Diagnosis not present

## 2020-08-14 LAB — C-REACTIVE PROTEIN: CRP: 0.5 mg/dL (ref <1.0)

## 2020-08-14 LAB — CMP (CANCER CENTER ONLY)
ALT: 36 U/L (ref 0–44)
AST: 24 U/L (ref 15–41)
Albumin: 4 g/dL (ref 3.5–5.0)
Alkaline Phosphatase: 45 U/L (ref 38–126)
Anion gap: 6 (ref 5–15)
BUN: 15 mg/dL (ref 6–20)
CO2: 25 mmol/L (ref 22–32)
Calcium: 9.2 mg/dL (ref 8.9–10.3)
Chloride: 109 mmol/L (ref 98–111)
Creatinine: 0.69 mg/dL (ref 0.44–1.00)
GFR, Estimated: >60 mL/min (ref >60)
Glucose, Bld: 95 mg/dL (ref 70–99)
Potassium: 4.1 mmol/L (ref 3.5–5.1)
Sodium: 140 mmol/L (ref 135–145)
Total Bilirubin: 0.4 mg/dL (ref 0.3–1.2)
Total Protein: 7.2 g/dL (ref 6.5–8.1)

## 2020-08-14 LAB — RETIC PANEL
Immature Retic Fract: 9.1 % (ref 2.3–15.9)
RBC.: 4.98 MIL/uL (ref 3.87–5.11)
Retic Count, Absolute: 69.2 10*3/uL (ref 19.0–186.0)
Retic Ct Pct: 1.4 % (ref 0.4–3.1)
Reticulocyte Hemoglobin: 31 pg (ref >27.9)

## 2020-08-14 LAB — CBC WITH DIFFERENTIAL (CANCER CENTER ONLY)
Abs Immature Granulocytes: 0.01 10*3/uL (ref 0.00–0.07)
Basophils Absolute: 0 10*3/uL (ref 0.0–0.1)
Basophils Relative: 1 %
Eosinophils Absolute: 0.2 10*3/uL (ref 0.0–0.5)
Eosinophils Relative: 2 %
HCT: 40.4 % (ref 36.0–46.0)
Hemoglobin: 13.6 g/dL (ref 12.0–15.0)
Immature Granulocytes: 0 %
Lymphocytes Relative: 30 %
Lymphs Abs: 2.1 10*3/uL (ref 0.7–4.0)
MCH: 27.5 pg (ref 26.0–34.0)
MCHC: 33.7 g/dL (ref 30.0–36.0)
MCV: 81.8 fL (ref 80.0–100.0)
Monocytes Absolute: 0.8 10*3/uL (ref 0.1–1.0)
Monocytes Relative: 11 %
Neutro Abs: 3.9 10*3/uL (ref 1.7–7.7)
Neutrophils Relative %: 56 %
Platelet Count: 322 10*3/uL (ref 150–400)
RBC: 4.94 MIL/uL (ref 3.87–5.11)
RDW: 13.5 % (ref 11.5–15.5)
WBC Count: 6.9 10*3/uL (ref 4.0–10.5)
nRBC: 0 % (ref 0.0–0.2)

## 2020-08-14 LAB — IRON AND TIBC
Iron: 73 ug/dL (ref 41–142)
Saturation Ratios: 31 % (ref 21–57)
TIBC: 234 ug/dL — ABNORMAL LOW (ref 236–444)
UIBC: 161 ug/dL (ref 120–384)

## 2020-08-14 LAB — SEDIMENTATION RATE: Sed Rate: 4 mm/hr (ref 0–22)

## 2020-08-14 LAB — LACTATE DEHYDROGENASE: LDH: 161 U/L (ref 98–192)

## 2020-08-14 LAB — FERRITIN: Ferritin: 330 ng/mL — ABNORMAL HIGH (ref 11–307)

## 2020-08-14 NOTE — Progress Notes (Signed)
[place] [phone]   Fax:([phone]) [place] NOTE  Patient Care Team: Hayden Rasmussen, MD as PCP - General (Family Medicine)  Hematological/Oncological History 1) Labs from PCP, Dr. Rachell Cipro 07/30/2020: Iron 31 (L), iron saturation 12%, Ferritin 494 (H), WBC 9.4, Hgb 14.0, MCV 81.5, Plt 346  2) 08/14/2020: Establish care with Dede Query PA-C  CHIEF COMPLAINTS/PURPOSE OF CONSULTATION:  "Elevated ferritin and low iron saturation"  HISTORY OF PRESENTING ILLNESS:  Betty Leonard 40 y.o. female with medical history significant for depression, esophagitis, GERD, menorrhagia s/p uterine ablation, migraines and iron deficiency anemia. Patient is unaccompanied for this visit.   On review of the previous records, Betty Leonard has been under the care of Dr. Marin Olp at [facility] in [facility] for iron deficiency anemia due to menorrhagia. Patient received several IV venofer infusions, with most recent infusion on 11/10/2019.   On exam today, Betty Leonard reports chronic fatigue that has been present for several years. She is still able to complete her ADLs on her own but drinks multiple cups of coffee. She has a good appetite without any weight changes. Her dietary restrictions include not eating red meat since she was a child. Patient has post prandial nausea that she attributes to acid reflux with certain foods. She does not take any antacids at this time. She denies any vomiting episodes. Patient denies abdominal pain and notes to have regular bowel movements. She denies easy bruising or signs of bleeding. This includes no longer having a menstrual cycle since undergoing uterine ablation in 2021. She craves ice chips. Patient denies fevers, chills, night sweats, shortness of breath, chest pain or cough. She has no other complaints. Rest of the 10 point ROS is below.   MEDICAL HISTORY:  Past Medical History:  Diagnosis Date   Anemia    iron  infusion sept 2020   Chicken pox    Depression    Esophagitis 12/24/2011   GERD (gastroesophageal reflux disease)    Menorrhagia    with irregular cycles   Migraines    Pneumonia 07/2018   Seasonal allergies    UTI (urinary tract infection) finished antibiotic 04-11-2019    SURGICAL HISTORY: Past Surgical History:  Procedure Laterality Date   ESOPHAGOGASTRODUODENOSCOPY  12/24/2011   Procedure: ESOPHAGOGASTRODUODENOSCOPY (EGD);  Surgeon: Inda Castle, MD;  Location: [facility];  Service: Endoscopy;  Laterality: N/A;   FOOT SURGERY Right 2009   repair remotely after a fracture   FOOT SURGERY Right 1'2020   HYSTEROSCOPY WITH NOVASURE N/A 04/25/2019   Procedure: HYSTEROSCOPY WITH NOVASURE ABLATION;  Surgeon: Megan Salon, MD;  Location: [facility];  Service: Gynecology;  Laterality: N/A;   KNEE ARTHROSCOPY Left 2018    SOCIAL HISTORY: Social History   Socioeconomic History   Marital status: Single    Spouse name: Not on file   Number of children: Not on file   Years of education: Not on file   Highest education level: Not on file  Occupational History   Not on file  Tobacco Use   Smoking status: Former    Packs/day: 1.00    Years: 5.00    Pack years: 5.00    Types: Cigarettes    Quit date: 12/29/2018    Years since quitting: 1.6   Smokeless tobacco: Never   Tobacco comments:    stopped on 12/29/2018  Vaping Use   Vaping Use: Never used  Substance and Sexual Activity  Alcohol use: Not Currently    Alcohol/week: 0.0 standard drinks   Drug use: No   Sexual activity: Not Currently    Birth control/protection: Abstinence  Other Topics Concern   Not on file  Social History Narrative   Work or Database administrator, [employer]      Home Situation: lives with roomate      Spiritual Beliefs: Christian      Lifestyle: CV exercise (58mnutes) 3 days per week; diet is poor               Social Determinants of HRadio broadcast assistantStrain: Not on  file  Food Insecurity: No Food Insecurity   Worried About RCharity fundraiserin the Last Year: Never true   RArboriculturistin the Last Year: Never true  Transportation Needs: Not on file  Physical Activity: Not on file  Stress: Not on file  Social Connections: Not on file  Intimate Partner Violence: Not on file    FAMILY HISTORY: Family History  Problem Relation Age of Onset   Leukemia Paternal Grandfather    Lung cancer Paternal Grandfather    Arthritis Paternal Grandfather    Diabetes Paternal Grandfather    Heart disease Paternal Grandfather    Hypertension Paternal Grandfather    Kidney disease Paternal Grandfather    Prostate cancer Paternal Grandfather    Gaucher's disease Paternal G46   Sudden death Father        age 40 nothing found on autopsy   Diabetes Father    Hyperlipidemia Father    Hypertension Father    Mental illness Mother        bipolar, schizophrenia   Alcohol abuse Maternal Grandfather    Stroke Paternal Grandmother    Colon cancer Neg Hx     ALLERGIES:  is allergic to codeine, hydrocodone, iodine, shellfish allergy, and aspirin.  MEDICATIONS:  Current Outpatient Medications  Medication Sig Dispense Refill   EPINEPHrine 0.3 mg/0.3 mL IJ SOAJ injection Inject 0.3 mg into the muscle once.     Vitamin D, Ergocalciferol, (DRISDOL) 1.25 MG (50000 UNIT) CAPS capsule Take 50,000 Units by mouth once a week.     No current facility-administered medications for this visit.    REVIEW OF SYSTEMS:   Constitutional: ( - ) fevers, ( - )  chills , ( - ) night sweats Eyes: ( - ) blurriness of vision, ( - ) double vision, ( - ) watery eyes Ears, nose, mouth, throat, and face: ( - ) mucositis, ( - ) sore throat Respiratory: ( - ) cough, ( - ) dyspnea, ( - ) wheezes Cardiovascular: ( - ) palpitation, ( - ) chest discomfort, ( - ) lower extremity swelling Gastrointestinal:  ( + ) nausea, ( - ) heartburn, ( - ) change in bowel habits Skin: ( - )  abnormal skin rashes Lymphatics: ( - ) new lymphadenopathy, ( - ) easy bruising Neurological: ( - ) numbness, ( - ) tingling, ( - ) new weaknesses Behavioral/Psych: ( - ) mood change, ( - ) new changes  All other systems were reviewed with the patient and are negative.  PHYSICAL EXAMINATION: ECOG PERFORMANCE STATUS: 1 - Symptomatic but completely ambulatory  Vitals:   08/14/20 1053  BP: 116/71  Pulse: 68  Resp: 17  Temp: 98.7 F (37.1 C)  SpO2: 97%   Filed Weights   08/14/20 1053  Weight: 222 lb (100.7 kg)    GENERAL: well appearing female in  NAD  SKIN: skin color, texture, turgor are normal, no rashes or significant lesions EYES: conjunctiva are pink and non-injected, sclera clear OROPHARYNX: no exudate, no erythema; lips, buccal mucosa, and tongue normal  NECK: supple, non-tender LYMPH:  no palpable lymphadenopathy in the cervical, supraclavicular lymph nodes.  LUNGS: clear to auscultation and percussion with normal breathing effort HEART: regular rate & rhythm and no murmurs and no lower extremity edema ABDOMEN: soft, non-tender, non-distended, normal bowel sounds Musculoskeletal: no cyanosis of digits and no clubbing  PSYCH: alert & oriented x 3, fluent speech NEURO: no focal motor/sensory deficits  LABORATORY DATA:  I have reviewed the data as listed CBC Latest Ref Rng & Units 08/14/2020 03/08/2020 01/09/2020  WBC 4.0 - 10.5 K/uL 6.9 10.7(H) 7.6  Hemoglobin 12.0 - 15.0 g/dL 13.6 14.4 13.8  Hematocrit 36.0 - 46.0 % 40.4 44.6 43.4  Platelets 150 - 400 K/uL 322 369 333    CMP Latest Ref Rng & Units 08/14/2020 03/08/2020 01/09/2020  Glucose 70 - 99 mg/dL 95 116(H) 91  BUN 6 - 20 mg/dL 15 21(H) 12  Creatinine 0.44 - 1.00 mg/dL 0.69 0.86 0.66  Sodium 135 - 145 mmol/L 140 145 139  Potassium 3.5 - 5.1 mmol/L 4.1 4.9 4.1  Chloride 98 - 111 mmol/L 109 107 104  CO2 22 - 32 mmol/L '25 29 28  ' Calcium 8.9 - 10.3 mg/dL 9.2 10.2 9.7  Total Protein 6.5 - 8.1 g/dL 7.2 7.5 7.1   Total Bilirubin 0.3 - 1.2 mg/dL 0.4 0.5 0.6  Alkaline Phos 38 - 126 U/L 45 52 55  AST 15 - 41 U/L 24 9(L) 12(L)  ALT 0 - 44 U/L 36 10 16   ASSESSMENT & PLAN Betty Leonard is a 40 y.o. female who presents to the clinic for evaluation for elevated ferritin levels and low iron saturation without anemia. It is uncertain the underlying cause of patient's multiple systems including chronic fatigue, headaches and nausea. Based on prior labs, the ferritin levels have been elevated since November 2021 without any additional iron supplementation so we need to evaluate for other causes including inflammatory process and liver disease. Patient will proceed with labs today to check CBC, CMP, LDH, ESR, CRP, Ferritin, Iron and TIBC and Retic Panel.   #Elevated ferritin, low iron saturation without anemia: --Labs today to check CBC, CMP, LDH, ESR, CRP, Ferritin, Iron and TIBC and Retic Panel.  --If above workup is unremarkable, consider abdominal US to rule out liver disease --RTC based on above workup.   #Genetic testing: --Patient's paternal grandfather was diagnosed with Gaucher's disease. --Patient is interested in genetic testing to see if she has the trait. We will reach out to our genetic counselors for assistance.   Orders Placed This Encounter  Procedures   CBC with Differential ([place] Only)    Standing Status:   Future    Number of Occurrences:   1    Standing Expiration Date:   08/14/2021   CMP ([facility] only)    Standing Status:   Future    Number of Occurrences:   1    Standing Expiration Date:   08/14/2021   Ferritin    Standing Status:   Future    Number of Occurrences:   1    Standing Expiration Date:   08/14/2021   Iron and TIBC    Standing Status:   Future    Number of Occurrences:   1    Standing Expiration Date:  08/14/2021   Retic Panel    Standing Status:   Future    Number of Occurrences:   1    Standing Expiration Date:   08/14/2021   Lactate dehydrogenase  (LDH)    Standing Status:   Future    Number of Occurrences:   1    Standing Expiration Date:   08/14/2021   C-reactive protein    Standing Status:   Future    Number of Occurrences:   1    Standing Expiration Date:   08/14/2021   Sedimentation rate    Standing Status:   Future    Number of Occurrences:   1    Standing Expiration Date:   08/14/2021    All questions were answered. The patient knows to call the clinic with any problems, questions or concerns.  I have spent a total of 60 minutes minutes of face-to-face and non-face-to-face time, preparing to see the patient, obtaining and/or reviewing separately obtained history, performing a medically appropriate examination, counseling and educating the patient, ordering tests,  documenting clinical information in the electronic health record, and care coordination.   Dede Query, PA-C Department of Hematology/Oncology [place] at [facility] Phone: [phone]  Patient was seen with Dr. Lorenso Courier.   I have read the above note and personally examined the patient. I agree with the assessment and plan as noted above.  Briefly Betty Leonard is a 40 year old female who presents for evaluation of elevated ferritin with normal hemoglobin.  Review of the labs appear consistent with ferritin elevations due to inflammation versus chronic disease.  If our battery of test is negative could consider further liver work-up to include ultrasound of the liver versus HFE testing.  Patient does have other nonspecific symptoms including fatigue, migraines, and nausea.  Interestingly she does have a family history of Gaucher's disease.  We will look into where to send this patient for diagnostic evaluation for this rare disease.   Ledell Peoples, MD Department of Hematology/Oncology [place] at [facility] Phone: [phone] Pager: [phone] Email: [email or web address]

## 2020-08-16 ENCOUNTER — Telehealth: Payer: Self-pay | Admitting: *Deleted

## 2020-08-16 NOTE — Telephone Encounter (Signed)
Received call from pt requesting review of lab results. Pt requests call back from Betty Leonard, [place]

## 2020-08-19 ENCOUNTER — Telehealth: Payer: Self-pay | Admitting: Physician Assistant

## 2020-08-19 ENCOUNTER — Encounter: Payer: Self-pay | Admitting: Family

## 2020-08-19 NOTE — Telephone Encounter (Signed)
I called [name] on7/29/2022 to review the lab results from 08/14/2020. CBC and CMP were unremarkable. Ferritin levels were mildly elevated at 330 which has improved over the last eight months. Iron panel did not indicate iron deficiency. Inflammatory markers were negative.   Since there is mild elevation of ferritin levels, we recommend to monitor for now and no further intervention is needed. Patient will return to the clinic as needed.   Patient's grandfather had Gaucher's disease and patient inquired about genetic testing. I followed up with our genetic counselors who recommended a referral to either [facility] or [facility]. I will make a referral to [facility] genetic counseling.   Patient expressed understanding and satisfaction with the plan provided.

## 2020-08-23 ENCOUNTER — Other Ambulatory Visit: Payer: Self-pay

## 2020-08-23 ENCOUNTER — Other Ambulatory Visit: Payer: 59

## 2020-08-23 DIAGNOSIS — M7751 Other enthesopathy of right foot: Secondary | ICD-10-CM

## 2020-08-23 DIAGNOSIS — M7752 Other enthesopathy of left foot: Secondary | ICD-10-CM

## 2020-10-01 ENCOUNTER — Other Ambulatory Visit: Payer: Self-pay

## 2020-10-01 ENCOUNTER — Ambulatory Visit (INDEPENDENT_AMBULATORY_CARE_PROVIDER_SITE_OTHER): Payer: 59 | Admitting: *Deleted

## 2020-10-01 DIAGNOSIS — M7751 Other enthesopathy of right foot: Secondary | ICD-10-CM

## 2020-10-01 NOTE — Progress Notes (Signed)
Patient presents today to pick up custom molded foot orthotics, diagnosed with capsulitis by Dr. Amalia Hailey.   Orthotics were dispensed. Reviewed instructions for break-in and wear. Written instructions given to patient.   Patient will follow up as needed.   [facility] - order # [id number]  Patient was upset leaving stating she wasn't aware that her insurance didn't cover the orthotics and was responsible for a bill that was almost $500. She did try on the orthotics, but did not give me any feedback on how they felt.

## 2020-12-23 ENCOUNTER — Other Ambulatory Visit: Payer: Self-pay

## 2020-12-23 ENCOUNTER — Ambulatory Visit (INDEPENDENT_AMBULATORY_CARE_PROVIDER_SITE_OTHER): Payer: 59

## 2020-12-23 ENCOUNTER — Ambulatory Visit (INDEPENDENT_AMBULATORY_CARE_PROVIDER_SITE_OTHER): Payer: 59 | Admitting: Podiatry

## 2020-12-23 DIAGNOSIS — M7751 Other enthesopathy of right foot: Secondary | ICD-10-CM

## 2020-12-23 DIAGNOSIS — M216X1 Other acquired deformities of right foot: Secondary | ICD-10-CM | POA: Diagnosis not present

## 2020-12-23 NOTE — Progress Notes (Signed)
HPI: 40 y.o. female presenting today for follow-up evaluation of right forefoot pain.  Patient has now been dealing with pain to the second toe for several months now.  We have tried different conservative treatment options to alleviate pressure from the forefoot including custom orthotics, steroid injections, oral anti-inflammatories, and immobilization with offloading with minimal success.  Patient is very frustrated and would like to to pursue more aggressive surgical options.  She does have history of third metatarsal head resection to the right foot with good success.  Past Medical History:  Diagnosis Date   Anemia    iron infusion sept 2020   Chicken pox    Depression    Esophagitis 12/24/2011   GERD (gastroesophageal reflux disease)    Menorrhagia    with irregular cycles   Migraines    Pneumonia 07/2018   Seasonal allergies    UTI (urinary tract infection) finished antibiotic 04-11-2019     Physical Exam: General: The patient is alert and oriented x3 in no acute distress.  Dermatology: Skin is warm, dry and supple bilateral lower extremities. Negative for open lesions or macerations.  Vascular: Palpable pedal pulses bilaterally. No edema or erythema noted. Capillary refill within normal limits.  Neurological: Epicritic and protective threshold grossly intact bilaterally.   Musculoskeletal Exam: Range of motion within normal limits to all pedal and ankle joints bilateral. Muscle strength 5/5 in all groups bilateral.  Chronic pain on palpation range of motion to the second MTP joint  Radiographic Exam:  Absence of the third metatarsal head noted.  Orthopedic hardware is intact to the second metatarsal head and first TMT joint.  Normal osseous mineralization.  Joint spaces preserved.  Assessment: 1.  Plantarflexed metatarsal with MTP capsulitis right second digit 2.  History of right foot surgery 2009 3.  History of third metatarsal head resection right foot   Plan of  Care:  1. Patient evaluated. X-Rays reviewed.  2.  After reviewing x-rays and discussing different treatment options with the patient, I do believe surgery is possible viable option for the patient.  Although this is not optimal she has tried multiple conservative modalities with no success.  She is also had surgery into the second metatarsal head with intact hardware.  She did very well with the metatarsal head resection of the third digit in 2020 and she would like to have the same procedure done.  We did discuss possible transfer lesions and transfer lobe to the adjacent metatarsal heads.  Patient understands the risks and would still like to proceed with this procedure 3. Today we discussed the conservative versus surgical management of the presenting pathology. The patient opts for surgical management. All possible complications and details of the procedure were explained. All patient questions were answered. No guarantees were expressed or implied. 4. Authorization for surgery was initiated today. Surgery will consist of second tarsal head resection right foot 5.  Return to clinic 1 week postop       Edrick Kins, DPM [facility]  Dr. Edrick Kins, DPM    [address], [place] [address]                Office [phone]  Fax [phone]

## 2021-01-09 ENCOUNTER — Telehealth: Payer: Self-pay | Admitting: Urology

## 2021-01-09 NOTE — Telephone Encounter (Signed)
DOS -02/06/21  OSTECT COMP MET HEAD 2ND RIGHT --- 24825  [employer]ET[employer] EFFECTIVE DATE - 01/20/15   SPOKE WITH [employer]ET[employer]'S AUTOMATIVE SYSTEM FOR CPT CODE 00370 NO PRIOR AUTH IS REQUIRED.  REF # [id number]

## 2021-01-19 HISTORY — PX: FOOT SURGERY: SHX648

## 2021-02-06 ENCOUNTER — Other Ambulatory Visit: Payer: Self-pay | Admitting: Podiatry

## 2021-02-06 ENCOUNTER — Encounter: Payer: Self-pay | Admitting: Podiatry

## 2021-02-06 DIAGNOSIS — M216X1 Other acquired deformities of right foot: Secondary | ICD-10-CM | POA: Diagnosis not present

## 2021-02-06 MED ORDER — TRAMADOL HCL 50 MG PO TABS: 50.0000 mg | ORAL_TABLET | ORAL | 0 refills | Status: AC | PRN

## 2021-02-06 MED ORDER — IBUPROFEN 800 MG PO TABS: 800.0000 mg | ORAL_TABLET | Freq: Three times a day (TID) | ORAL | 1 refills | Status: DC

## 2021-02-06 MED ORDER — ONDANSETRON HCL 8 MG PO TABS: 8.0000 mg | ORAL_TABLET | Freq: Three times a day (TID) | ORAL | 0 refills | Status: DC | PRN

## 2021-02-06 NOTE — Progress Notes (Signed)
PRN psotop 

## 2021-02-07 ENCOUNTER — Ambulatory Visit (INDEPENDENT_AMBULATORY_CARE_PROVIDER_SITE_OTHER): Payer: 59 | Admitting: Podiatry

## 2021-02-07 ENCOUNTER — Other Ambulatory Visit: Payer: Self-pay

## 2021-02-07 ENCOUNTER — Other Ambulatory Visit: Payer: Self-pay | Admitting: Podiatry

## 2021-02-07 DIAGNOSIS — M7751 Other enthesopathy of right foot: Secondary | ICD-10-CM

## 2021-02-07 DIAGNOSIS — M216X1 Other acquired deformities of right foot: Secondary | ICD-10-CM

## 2021-02-07 DIAGNOSIS — Z9889 Other specified postprocedural states: Secondary | ICD-10-CM

## 2021-02-07 MED ORDER — HYDROMORPHONE HCL 2 MG PO TABS
2.0000 mg | ORAL_TABLET | ORAL | 0 refills | Status: DC | PRN
Start: 2021-02-07 — End: 2021-02-07

## 2021-02-07 MED ORDER — HYDROMORPHONE HCL 2 MG PO TABS: 2.0000 mg | ORAL_TABLET | ORAL | 0 refills | Status: DC | PRN

## 2021-02-07 MED ORDER — ONDANSETRON HCL 8 MG PO TABS: 8.0000 mg | ORAL_TABLET | Freq: Three times a day (TID) | ORAL | 0 refills | Status: DC | PRN

## 2021-02-07 MED ORDER — IBUPROFEN 800 MG PO TABS: 800.0000 mg | ORAL_TABLET | Freq: Three times a day (TID) | ORAL | 1 refills | Status: DC

## 2021-02-07 NOTE — Progress Notes (Signed)
Pain medications sent to [facility], other pharmacy did not have in stock, spoke with patient to let her know

## 2021-02-12 ENCOUNTER — Ambulatory Visit (INDEPENDENT_AMBULATORY_CARE_PROVIDER_SITE_OTHER): Payer: 59

## 2021-02-12 ENCOUNTER — Other Ambulatory Visit: Payer: Self-pay

## 2021-02-12 ENCOUNTER — Ambulatory Visit (INDEPENDENT_AMBULATORY_CARE_PROVIDER_SITE_OTHER): Payer: 59 | Admitting: Podiatry

## 2021-02-12 ENCOUNTER — Telehealth: Payer: Self-pay | Admitting: *Deleted

## 2021-02-12 DIAGNOSIS — Z9889 Other specified postprocedural states: Secondary | ICD-10-CM | POA: Diagnosis not present

## 2021-02-12 NOTE — Telephone Encounter (Signed)
Patient is calling and wanted to know when can she start back driving?

## 2021-02-12 NOTE — Telephone Encounter (Signed)
Returned the call back to patient giving recommendations per physician, verbalized understanding but does not like and is she bored.

## 2021-02-12 NOTE — Telephone Encounter (Signed)
After sutures are removed and incision is healed and the pin is removed, 1 or 2 more weeks. Until then she has to be in the CAM boot

## 2021-02-14 NOTE — Progress Notes (Signed)
°  Subjective:  Patient ID: Betty Leonard, female    DOB: [birth year],  MRN: [id number]  Chief Complaint  Patient presents with   Post-op Problem      Bleeding through bandages-POV # DOS 02/06/2021 2ND METATARSAL HEAD RESECTION RT    DOS: 02/06/21 Procedure: 2nd metatarsal head resection right  41 y.o. female presents with the above complaint. History confirmed with patient. States she noticed a good bit of bleeding on the dressing and wanted to get it looked at. Has had a lot of pain and thinks the pain medication is not working. States she barely slept due to the pain.  Objective:  Physical Exam: tenderness at the surgical site, local edema noted, and calf supple, nontender. Incision: healing well, no dehiscence, no significant erythema, slight clear drainage present    Assessment:   1. Capsulitis of metatarsophalangeal (MTP) joint of right foot   2. Plantar flexed metatarsal bone of right foot   3. Post-operative state     Plan:  Patient was evaluated and treated and all questions answered.  Post-operative State -Dressing applied consisting of sterile gauze, kerlix, and ACE bandage -WBAT in CAM boot -Pain medication refilled switch to dilaudid -XRs needed at follow-up: 3 view Foot   No follow-ups on file.

## 2021-02-17 NOTE — Progress Notes (Signed)
° °  Subjective:  Patient presents today status post second metatarsal head resection right foot DOS: 02/06/2021.  Patient states that she is doing well.  She did have to come in for a dressing change unexpectedly after surgery.  Overall she is doing well though and weightbearing as tolerated in the cam boot.  She presents for further treatment and evaluation  Past Medical History:  Diagnosis Date   Anemia    iron infusion sept 2020   Chicken pox    Depression    Esophagitis 12/24/2011   GERD (gastroesophageal reflux disease)    Menorrhagia    with irregular cycles   Migraines    Pneumonia 07/2018   Seasonal allergies    UTI (urinary tract infection) finished antibiotic 04-11-2019      Objective/Physical Exam Neurovascular status intact.  Skin incisions appear to be well coapted with sutures intact. No sign of infectious process noted. No dehiscence. No active bleeding noted. Moderate edema noted to the surgical extremity.  Radiographic Exam:  Percutaneous fixation pin is intact to the second ray of the foot.  Absence of the second and third metatarsal heads noted.  Clean osteotomy site.  Assessment: 1. s/p second metatarsal head resection right. DOS: 02/06/2021   Plan of Care:  1. Patient was evaluated. X-rays reviewed 2.  Continue weightbearing in the cam boot 3.  Dressings changed.  Keep clean dry intact x1 week 4.  Return to clinic in 1 week for percutaneous pin removal   Edrick Kins, DPM [facility]  Dr. Edrick Kins, DPM    [address],  [address]                Office [phone]  Fax [phone]

## 2021-02-19 ENCOUNTER — Ambulatory Visit (INDEPENDENT_AMBULATORY_CARE_PROVIDER_SITE_OTHER): Payer: 59 | Admitting: Podiatry

## 2021-02-19 ENCOUNTER — Other Ambulatory Visit: Payer: Self-pay

## 2021-02-19 DIAGNOSIS — Z9889 Other specified postprocedural states: Secondary | ICD-10-CM

## 2021-02-19 NOTE — Progress Notes (Signed)
° °  Subjective:  Patient presents today status post second metatarsal head resection right foot DOS: 02/06/2021.  Patient states that she is doing well.  Patient presents today to have the percutaneous fixation pin removed as well as sutures Past Medical History:  Diagnosis Date   Anemia    iron infusion sept 2020   Chicken pox    Depression    Esophagitis 12/24/2011   GERD (gastroesophageal reflux disease)    Menorrhagia    with irregular cycles   Migraines    Pneumonia 07/2018   Seasonal allergies    UTI (urinary tract infection) finished antibiotic 04-11-2019      Objective/Physical Exam Neurovascular status intact.  Skin incisions appear to be well coapted with sutures intact. No sign of infectious process noted. No dehiscence. No active bleeding noted. Moderate edema noted to the surgical extremity.   Assessment: 1. s/p second metatarsal head resection right. DOS: 02/06/2021   Plan of Care:  1. Patient was evaluated. 2.  Sutures removed and the percutaneous fixation pin was also removed today 3.  Patient may discontinue the cam boot.  Postsurgical shoe dispensed weightbearing as tolerated x4 weeks 4.  Compression ankle sleeves dispensed to wear daily 5.  Return to clinic in 4 weeks  Edrick Kins, DPM [facility]  Dr. Edrick Kins, DPM    [address], [place] [address]                Office [phone]  Fax [phone]

## 2021-03-03 ENCOUNTER — Other Ambulatory Visit: Payer: Self-pay | Admitting: Podiatry

## 2021-03-03 ENCOUNTER — Telehealth: Payer: Self-pay | Admitting: *Deleted

## 2021-03-03 ENCOUNTER — Encounter: Payer: 59 | Admitting: Podiatry

## 2021-03-03 MED ORDER — IBUPROFEN 800 MG PO TABS: 800.0000 mg | ORAL_TABLET | Freq: Three times a day (TID) | ORAL | 1 refills | Status: DC

## 2021-03-03 NOTE — Telephone Encounter (Signed)
Stay in the postsurgical shoe, if it is increasingly painful she can return to the cam boot.  Follow-up neck scheduled appointment.  I sent in a prescription for ibuprofen 800 mg 3 times daily to take as needed.  Thanks, Dr. Amalia Hailey

## 2021-03-03 NOTE — Telephone Encounter (Signed)
Patient is calling because she has been wearing the surgical shoe and seems like it's swelling and painful  a lot last couple of days, has been icing and trying to stay off of it as much as possible. Please advise.

## 2021-03-04 NOTE — Telephone Encounter (Signed)
Called patient and gave recommendations per physician , verbalized understanding,stated that she has 2 prescriptions of the ibuprofen at home already , will take those before picking up new prescription.

## 2021-03-12 ENCOUNTER — Ambulatory Visit (INDEPENDENT_AMBULATORY_CARE_PROVIDER_SITE_OTHER): Payer: 59 | Admitting: Podiatry

## 2021-03-12 ENCOUNTER — Other Ambulatory Visit: Payer: Self-pay

## 2021-03-12 DIAGNOSIS — Z9889 Other specified postprocedural states: Secondary | ICD-10-CM

## 2021-03-12 NOTE — Progress Notes (Signed)
° °  Subjective:  Patient presents today status post second metatarsal head resection right foot DOS: 02/06/2021.  Patient continues to have some pain and tenderness associated to the foot.  She has been weightbearing in the postsurgical shoe as instructed.  No new complaints at this time  Past Medical History:  Diagnosis Date   Anemia    iron infusion sept 2020   Chicken pox    Depression    Esophagitis 12/24/2011   GERD (gastroesophageal reflux disease)    Menorrhagia    with irregular cycles   Migraines    Pneumonia 07/2018   Seasonal allergies    UTI (urinary tract infection) finished antibiotic 04-11-2019      Objective/Physical Exam Neurovascular status intact.  Skin incision healed.  There is some somewhat hardened deep underlying soft tissue mass possibly due to underlying hardened hematoma to the surgical area.  Negative for edema.  Range of motion within normal limits.  There is some sensitivity with palpation along the area..   Assessment: 1. s/p second metatarsal head resection right. DOS: 02/06/2021   Plan of Care:  1. Patient was evaluated. 2.  Injection of 0.5 cc Celestone Soluspan injected around the second and third MTP to help break up any scar tissue adhesion that is developing more hematoma in the area. 3.  Recommend daily range of motion exercises to the second and third toes 4.  Patient may discontinue the postsurgical shoe and transition into good supportive shoes and sneakers 5.  Patient is to refrain from work as she continues to recover and transition out of the postsurgical shoe.  She continues to have some pain and tenderness.  Planned return to work is 04/28/2021  6.  Recommend OTC Voltaren antiinflammatory gel to the massage to the area daily  7.  Return to clinic 4 weeks for follow-up x-ray  Edrick Kins, DPM [facility]  Dr. Edrick Kins, DPM    [address], [place] [address]                 Office [phone]  Fax [phone]

## 2021-04-09 ENCOUNTER — Ambulatory Visit (INDEPENDENT_AMBULATORY_CARE_PROVIDER_SITE_OTHER): Payer: 59 | Admitting: Podiatry

## 2021-04-09 ENCOUNTER — Other Ambulatory Visit: Payer: Self-pay

## 2021-04-09 ENCOUNTER — Ambulatory Visit (INDEPENDENT_AMBULATORY_CARE_PROVIDER_SITE_OTHER): Payer: 59

## 2021-04-09 DIAGNOSIS — M216X1 Other acquired deformities of right foot: Secondary | ICD-10-CM

## 2021-04-09 DIAGNOSIS — Z9889 Other specified postprocedural states: Secondary | ICD-10-CM

## 2021-04-09 NOTE — Progress Notes (Signed)
? ?  Subjective:  ?Patient presents today status post second metatarsal head resection right foot DOS: 02/06/2021.  Patient has been wearing tennis shoes and she states that she is doing well.  If she is on her feet for significant amount of time she does have increased pain and tenderness to the surgical area however. ? ?Past Medical History:  ?Diagnosis Date  ? Anemia   ? iron infusion sept 2020  ? Chicken pox   ? Depression   ? Esophagitis 12/24/2011  ? GERD (gastroesophageal reflux disease)   ? Menorrhagia   ? with irregular cycles  ? Migraines   ? Pneumonia 07/2018  ? Seasonal allergies   ? UTI (urinary tract infection) finished antibiotic 04-11-2019  ? ?  ? ?Objective/Physical Exam ?Neurovascular status intact.  Skin incision healed.  Range of motion within normal limits to the second third MTP joint.  No edema noted.  Minimal tenderness to palpation throughout the surgical area.  Overall doing well ? ?Assessment: ?1. s/p second metatarsal head resection right. DOS: 02/06/2021 ? ? ?Plan of Care:  ?1. Patient was evaluated. ?2.  Overall the patient is doing much better.  She says that although she still has some slight tenderness with swelling to the second toe there is improvement since prior to surgery. ?3.  Recommend good supportive shoes and sneakers ?4.  Continue Voltaren gel 2 times daily as needed ?5. She may increase to full activity no restrictions beginning 04/28/2021 when the patient returns to work ?6.  Return to clinic as needed ? ? ?Edrick Kins, DPM ?[place] ? ?Dr. Edrick Kins, DPM  ?  ?[address]                                    ?[place], [place] [address]                ?Office [phone]  ?Fax [phone] ? ? ? ? ? ?

## 2021-05-26 ENCOUNTER — Ambulatory Visit (HOSPITAL_BASED_OUTPATIENT_CLINIC_OR_DEPARTMENT_OTHER): Payer: 59

## 2021-05-26 ENCOUNTER — Encounter (HOSPITAL_BASED_OUTPATIENT_CLINIC_OR_DEPARTMENT_OTHER): Payer: Self-pay | Admitting: Nurse Practitioner

## 2021-05-26 ENCOUNTER — Ambulatory Visit (INDEPENDENT_AMBULATORY_CARE_PROVIDER_SITE_OTHER): Payer: 59 | Admitting: Nurse Practitioner

## 2021-05-26 VITALS — BP 128/88 | HR 99 | Ht 66.0 in | Wt 231.0 lb

## 2021-05-26 DIAGNOSIS — F419 Anxiety disorder, unspecified: Secondary | ICD-10-CM

## 2021-05-26 DIAGNOSIS — Z7689 Persons encountering health services in other specified circumstances: Secondary | ICD-10-CM

## 2021-05-26 DIAGNOSIS — Z8669 Personal history of other diseases of the nervous system and sense organs: Secondary | ICD-10-CM

## 2021-05-26 DIAGNOSIS — T781XXS Other adverse food reactions, not elsewhere classified, sequela: Secondary | ICD-10-CM

## 2021-05-26 DIAGNOSIS — R7989 Other specified abnormal findings of blood chemistry: Secondary | ICD-10-CM

## 2021-05-26 DIAGNOSIS — T782XXS Anaphylactic shock, unspecified, sequela: Secondary | ICD-10-CM | POA: Diagnosis not present

## 2021-05-26 DIAGNOSIS — D500 Iron deficiency anemia secondary to blood loss (chronic): Secondary | ICD-10-CM | POA: Diagnosis not present

## 2021-05-26 DIAGNOSIS — F32A Depression, unspecified: Secondary | ICD-10-CM

## 2021-05-26 DIAGNOSIS — Z6837 Body mass index (BMI) 37.0-37.9, adult: Secondary | ICD-10-CM

## 2021-05-26 DIAGNOSIS — E669 Obesity, unspecified: Secondary | ICD-10-CM

## 2021-05-26 DIAGNOSIS — T7819XS Other adverse food reactions, not elsewhere classified, sequela: Secondary | ICD-10-CM

## 2021-05-26 MED ORDER — EPINEPHRINE 0.3 MG/0.3ML IJ SOAJ: 0.3000 mg | Freq: Once | INTRAMUSCULAR | 2 refills | Status: AC

## 2021-05-26 NOTE — Patient Instructions (Addendum)
Thank you for choosing [facility] for your Primary Care needs. I am excited for the opportunity to partner with you to meet your health care goals. It was a pleasure meeting you today! ? ?Recommendations from today's visit: ?We will check some labs today and make sure everything is looking ok.  ?Please let me know when you get in to [facility] for Gaucher- this is very interesting ? ?Information on diet, exercise, and health maintenance recommendations are listed below. This is information to help you be sure you are on track for optimal health and monitoring.  ? ?Please look over this and let us know if you have any questions or if you have completed any of the health maintenance outside of [place] so that we can be sure your records are up to date.  ?___________________________________________________________ ?About Me: ?I am an Adult-Geriatric Nurse Practitioner with a background in caring for patients for more than 20 years with a strong intensive care background. I provide primary care and sports medicine services to patients age 85 and older within this office. My education had a strong focus on caring for the older adult population, which I am passionate about. I am also the director of the APP Fellowship with [facility].  ? ?My desire is to provide you with the best service through preventive medicine and supportive care. I consider you a part of the medical team and value your input. I work diligently to ensure that you are heard and your needs are met in a safe and effective manner. I want you to feel comfortable with me as your provider and want you to know that your health concerns are important to me. ? ?For your information, our office hours are: ?Monday, Tuesday, and Thursday 8:00 AM - 5:00 PM ?Wednesday and Friday 8:00 AM - 12:00 PM.  ? ?In my time away from the office I am teaching new APP's within the system and am unavailable, but my partner, Dr. Burnard Bunting is in the  office for emergent needs.  ? ?If you have questions or concerns, please call our office at [phone] or send [place] a MyChart message and we will respond as quickly as possible.  ?____________________________________________________________ ?MyChart:  ?For all urgent or time sensitive needs we ask that you please call the office to avoid delays. Our number is [phone]. ?MyChart is not constantly monitored and due to the large volume of messages a day, replies may take up to 72 business hours. ? ?MyChart Policy: ?MyChart allows for you to see your visit notes, after visit summary, provider recommendations, lab and tests results, make an appointment, request refills, and contact your provider or the office for non-urgent questions or concerns. Providers are seeing patients during normal business hours and do not have built in time to review MyChart messages.  ?We ask that you allow a minimum of 3 business days for responses to [employer]. For this reason, please do not send urgent requests through [place]. Please call the office at [phone]. ?New and ongoing conditions may require a visit. We have virtual and in person visit available for your convenience.  ?Complex MyChart concerns may require a visit. Your provider may request you schedule a virtual or in person visit to ensure we are providing the best care possible. ?MyChart messages sent after 11:00 AM on Friday will not be received by the provider until Monday morning.  ?  ?Lab and Test Results: ?You will receive your lab and test  results on MyChart as soon as they are completed and results have been sent by the lab or testing facility. Due to this service, you will receive your results BEFORE your provider.  ?I review lab and tests results each morning prior to seeing patients. Some results require collaboration with other providers to ensure you are receiving the most appropriate care. For this reason, we ask that you please allow a minimum of  3-5 business days from the time the ALL results have been received for your provider to receive and review lab and test results and contact you about these.  ?Most lab and test result comments from the provider will be sent through [place]. Your provider may recommend changes to the plan of care, follow-up visits, repeat testing, ask questions, or request an office visit to discuss these results. You may reply directly to this message or call the office at [phone] to provide information for the provider or set up an appointment. ?In some instances, you will be called with test results and recommendations. Please let us know if this is preferred and we will make note of this in your chart to provide this for you.    ?If you have not heard a response to your lab or test results in 5 business days from all results returning to [place], please call the office to let us know. We ask that you please avoid calling prior to this time unless there is an emergent concern. Due to high call volumes, this can delay the resulting process. ? ?After Hours: ?For all non-emergency after hours needs, please call the office at [phone] and select the option to reach the on-call provider service. On-call services are shared between multiple [facility] offices and therefore it will not be possible to speak directly with your provider. On-call providers may provide medical advice and recommendations, but are unable to provide refills for maintenance medications.  ?For all emergency or urgent medical needs after normal business hours, we recommend that you seek care at the closest Urgent Care or Emergency Department to ensure appropriate treatment in a timely manner.  ?[facility] has a 24 hour emergency room located on the ground floor for your convenience.  ? ?Urgent Concerns During the Business Day ?Providers are seeing patients from 8AM to [place] with a busy schedule and are most often not able to respond  to non-urgent calls until the end of the day or the next business day. ?If you should have URGENT concerns during the day, please call and speak to the nurse or schedule a same day appointment so that we can address your concern without delay.  ? ?Thank you, again, for choosing me as your health care partner. I appreciate your trust and look forward to learning more about you.  ? ?Worthy Keeler, DNP, AGNP-c ?___________________________________________________________ ? ?Health Maintenance Recommendations ?Screening Testing ?Mammogram ?Every 1 -2 years based on history and risk factors ?Starting at age 86 ?Pap Smear ?Ages 21-39 every 3 years ?Ages 68-65 every 5 years with HPV testing ?More frequent testing may be required based on results and history ?Colon Cancer Screening ?Every 1-10 years based on test performed, risk factors, and history ?Starting at age 82 ?Bone Density Screening ?Every 2-10 years based on history ?Starting at age 80 for women ?Recommendations for men differ based on medication usage, history, and risk factors ?AAA Screening ?One time ultrasound ?Men 32-71 years old who have every smoked ?Lung Cancer Screening ?Low Dose Lung CT every 12  months ?Age 72-80 years with a 30 pack-year smoking history who still smoke or who have quit within the last 15 years ? ?Screening Labs ?Routine  Labs: Complete Blood Count (CBC), Complete Metabolic Panel (CMP), Cholesterol (Lipid Panel) ?Every 6-12 months based on history and medications ?May be recommended more frequently based on current conditions or previous results ?Hemoglobin A1c Lab ?Every 3-12 months based on history and previous results ?Starting at age 43 or earlier with diagnosis of diabetes, high cholesterol, BMI >26, and/or risk factors ?Frequent monitoring for patients with diabetes to ensure blood sugar control ?Thyroid Panel (TSH w/ T3 & T4) ?Every 6 months based on history, symptoms, and risk factors ?May be repeated more often if on  medication ?HIV ?One time testing for all patients 43 and older ?May be repeated more frequently for patients with increased risk factors or exposure ?Hepatitis C ?One time testing for all patients 46 and older

## 2021-05-26 NOTE — Progress Notes (Signed)
?Orma Render, DNP, AGNP-c ?Primary Care & Sports Medicine ?[place], [place] [address] ?[phone] ? ?New patient visit ? ? ?Patient: Betty Leonard   DOB: 11/03/1980   40 y.o. Female  MRN: [id number] ?Visit Date: 05/26/2021 ? ?Patient Care Team: ?Briar Sword, Coralee Pesa, NP as PCP - General (Nurse Practitioner) ? ?Today's Vitals  ? 05/26/21 1611  ?BP: 128/88  ?Pulse: 99  ?SpO2: 99%  ?Weight: 231 lb (104.8 kg)  ?Height: '5\' 6"'$  (1.676 m)  ? ?Body mass index is 37.28 kg/m?.  ? ?Today's healthcare provider: Orma Render, NP  ? ?Chief Complaint  ?Patient presents with  ? Establish Care  ? Weight Gain  ? ?Subjective  ?  ?Betty Leonard is a 41 y.o. female who presents today as a new patient to establish care.  ?  ?Patient endorses the following concerns presently: ?Weight Gain ?- endorses difficulty losing weight and increased weight gain despite diet and exercise changes ?- frustrated with her lack of ability to loose weight ?- Would like to discuss options that may be helpful for management ? ?Ablation 2 years ago ?- no periods since ablation ? 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 ?History reviewed and reveals the following: ?Past Medical History:  ?Diagnosis Date  ? Anemia   ? iron infusion sept 2020  ? Chicken pox   ? Depression   ? Esophagitis 12/24/2011  ? GERD (gastroesophageal reflux disease)   ? Menorrhagia   ? with irregular cycles  ? Migraines   ? Pneumonia 07/2018  ? Seasonal allergies   ? UTI (urinary tract infection) finished antibiotic 04-11-2019  ? ?Past Surgical History:  ?Procedure Laterality Date  ? ESOPHAGOGASTRODUODENOSCOPY  12/24/2011  ? Procedure: ESOPHAGOGASTRODUODENOSCOPY (EGD);  Surgeon: Inda Castle, MD;  Location: [facility];  Service: Endoscopy;  Laterality: N/A;  ? FOOT SURGERY Right 2009  ? repair remotely after a fracture  ? FOOT SURGERY Right 1'2020  ? HYSTEROSCOPY WITH NOVASURE N/A 04/25/2019  ? Procedure: HYSTEROSCOPY WITH NOVASURE  ABLATION;  Surgeon: Megan Salon, MD;  Location: [facility];  Service: Gynecology;  Laterality: N/A;  ? KNEE ARTHROSCOPY Left 2018  ? ?Family Status  ?Relation Name Status  ? PGF  Deceased  ? Father  Deceased  ? Mother  Other  ? MGF  Deceased  ? PGM  Deceased  ? MGM  Deceased  ? Neg Hx  (Not Specified)  ? ?Family History  ?Problem Relation Age of Onset  ? Leukemia Paternal Grandfather   ? Lung cancer Paternal Grandfather   ? Arthritis Paternal Grandfather   ? Diabetes Paternal Grandfather   ? Heart disease Paternal Grandfather   ? Hypertension Paternal Grandfather   ? Kidney disease Paternal Grandfather   ? Prostate cancer Paternal Grandfather   ? Gaucher's disease Paternal Grandfather   ? Sudden death Father   ?     age 22, nothing found on autopsy  ? Diabetes Father   ? Hyperlipidemia Father   ? Hypertension Father   ? Mental illness Mother   ?     bipolar, schizophrenia  ? Alcohol abuse Maternal Grandfather   ? Stroke Paternal Grandmother   ? Colon cancer Neg Hx   ? ?Social History  ? ?Socioeconomic History  ? Marital status: Single  ?  Spouse name: Not on file  ? Number of children: Not on file  ? Years of education: Not on file  ? Highest  education level: Not on file  ?Occupational History  ? Not on file  ?Tobacco Use  ? Smoking status: Former  ?  Packs/day: 1.00  ?  Years: 5.00  ?  Pack years: 5.00  ?  Types: Cigarettes  ?  Quit date: 12/29/2018  ?  Years since quitting: 2.4  ? Smokeless tobacco: Never  ? Tobacco comments:  ?  stopped on 12/29/2018  ?Vaping Use  ? Vaping Use: Never used  ?Substance and Sexual Activity  ? Alcohol use: Not Currently  ?  Alcohol/week: 0.0 standard drinks  ? Drug use: No  ? Sexual activity: Not Currently  ?  Birth control/protection: Abstinence  ?Other Topics Concern  ? Not on file  ?Social History Narrative  ? Work or Database administrator, [employer]  ?   ? Home Situation: lives with roomate  ?   ? Spiritual Beliefs: Christian  ?   ? Lifestyle: CV exercise (68mnutes) 3  days per week; diet is poor  ?   ?   ?   ?   ? ?Social Determinants of Health  ? ?Financial Resource Strain: Not on file  ?Food Insecurity: Not on file  ?Transportation Needs: Not on file  ?Physical Activity: Not on file  ?Stress: Not on file  ?Social Connections: Not on file  ? ?Outpatient Medications Prior to Visit  ?Medication Sig  ? [DISCONTINUED] EPINEPHrine 0.3 mg/0.3 mL IJ SOAJ injection Inject 0.3 mg into the muscle once.  ? ondansetron (ZOFRAN) 8 MG tablet Take 1 tablet (8 mg total) by mouth every 8 (eight) hours as needed for nausea or vomiting. (Patient not taking: Reported on 05/26/2021)  ? [DISCONTINUED] HYDROmorphone (DILAUDID) 2 MG tablet Take 1 tablet (2 mg total) by mouth every 4 (four) hours as needed for severe pain.  ? [DISCONTINUED] ibuprofen (ADVIL) 800 MG tablet Take 1 tablet (800 mg total) by mouth 3 (three) times daily. (Patient not taking: Reported on 05/26/2021)  ? [DISCONTINUED] Vitamin D, Ergocalciferol, (DRISDOL) 1.25 MG (50000 UNIT) CAPS capsule Take 50,000 Units by mouth once a week.  ? ?No facility-administered medications prior to visit.  ? ?Allergies  ?Allergen Reactions  ? Codeine Shortness Of Breath  ? Hydrocodone Shortness Of Breath  ? Iodine Other (See Comments)  ?  Shellfish allergy  ? Shellfish Allergy Anaphylaxis  ? Aspirin Hives  ? ?Immunization History  ?Administered Date(s) Administered  ? PFIZER(Purple Top)SARS-COV-2 Vaccination 04/08/2019, 05/08/2019  ? Pneumococcal Polysaccharide-23 08/07/2013  ? ? ?Review of Systems ?All review of systems negative except what is listed in the HPI ? ? Objective  ?  ?BP 128/88   Pulse 99   Ht '5\' 6"'$  (1.676 m)   Wt 231 lb (104.8 kg)   SpO2 99%   BMI 37.28 kg/m?  ?Physical Exam ?Vitals and nursing note reviewed.  ?Constitutional:   ?   General: She is not in acute distress. ?   Appearance: Normal appearance.  ?Eyes:  ?   Extraocular Movements: Extraocular movements intact.  ?   Conjunctiva/sclera: Conjunctivae normal.  ?   Pupils: Pupils  are equal, round, and reactive to light.  ?Neck:  ?   Vascular: No carotid bruit.  ?Cardiovascular:  ?   Rate and Rhythm: Normal rate and regular rhythm.  ?   Pulses: Normal pulses.  ?   Heart sounds: Normal heart sounds. No murmur heard. ?Pulmonary:  ?   Effort: Pulmonary effort is normal.  ?   Breath sounds: Normal breath sounds. No wheezing.  ?Abdominal:  ?  General: Bowel sounds are normal.  ?   Palpations: Abdomen is soft.  ?Musculoskeletal:     ?   General: Normal range of motion.  ?   Cervical back: Normal range of motion.  ?   Right lower leg: No edema.  ?   Left lower leg: No edema.  ?Skin: ?   General: Skin is warm and dry.  ?   Capillary Refill: Capillary refill takes less than 2 seconds.  ?Neurological:  ?   General: No focal deficit present.  ?   Mental Status: She is alert and oriented to person, place, and time.  ?Psychiatric:     ?   Mood and Affect: Mood normal.     ?   Behavior: Behavior normal.     ?   Thought Content: Thought content normal.     ?   Judgment: Judgment normal.  ? ? ?Results for orders placed or performed in visit on 05/26/21  ?Iron, TIBC and Ferritin Panel  ?Result Value Ref Range  ? Total Iron Binding Capacity 283 250 - 450 ug/dL  ? UIBC 235 131 - 425 ug/dL  ? Iron 48 27 - 159 ug/dL  ? Iron Saturation 17 15 - 55 %  ? Ferritin 331 (H) 15 - 150 ng/mL  ?CBC with Differential/Platelet  ?Result Value Ref Range  ? WBC 11.8 (H) 3.4 - 10.8 x10E3/uL  ? RBC 5.17 3.77 - 5.28 x10E6/uL  ? Hemoglobin 14.4 11.1 - 15.9 g/dL  ? Hematocrit 42.9 34.0 - 46.6 %  ? MCV 83 79 - 97 fL  ? MCH 27.9 26.6 - 33.0 pg  ? MCHC 33.6 31.5 - 35.7 g/dL  ? RDW 12.9 11.7 - 15.4 %  ? Platelets 397 150 - 450 x10E3/uL  ? Neutrophils 59 Not Estab. %  ? Lymphs 32 Not Estab. %  ? Monocytes 7 Not Estab. %  ? Eos 1 Not Estab. %  ? Basos 1 Not Estab. %  ? Neutrophils Absolute 6.9 1.4 - 7.0 x10E3/uL  ? Lymphocytes Absolute 3.8 (H) 0.7 - 3.1 x10E3/uL  ? Monocytes Absolute 0.9 0.1 - 0.9 x10E3/uL  ? EOS (ABSOLUTE) 0.1 0.0 - 0.4  x10E3/uL  ? Basophils Absolute 0.1 0.0 - 0.2 x10E3/uL  ? Immature Granulocytes 0 Not Estab. %  ? Immature Grans (Abs) 0.0 0.0 - 0.1 x10E3/uL  ?VITAMIN D 25 Hydroxy (Vit-D Deficiency, Fractures)  ?Result Value R

## 2021-05-28 ENCOUNTER — Encounter (HOSPITAL_BASED_OUTPATIENT_CLINIC_OR_DEPARTMENT_OTHER): Payer: Self-pay | Admitting: Nurse Practitioner

## 2021-05-28 LAB — B12 AND FOLATE PANEL
Folate: 14.6 ng/mL (ref >3.0)
Vitamin B-12: 492 pg/mL (ref 232–1245)

## 2021-05-28 LAB — CBC WITH DIFFERENTIAL/PLATELET
Basophils Absolute: 0.1 10*3/uL (ref 0.0–0.2)
Basos: 1 %
EOS (ABSOLUTE): 0.1 10*3/uL (ref 0.0–0.4)
Eos: 1 %
Hematocrit: 42.9 % (ref 34.0–46.6)
Hemoglobin: 14.4 g/dL (ref 11.1–15.9)
Immature Grans (Abs): 0 10*3/uL (ref 0.0–0.1)
Immature Granulocytes: 0 %
Lymphocytes Absolute: 3.8 10*3/uL — ABNORMAL HIGH (ref 0.7–3.1)
Lymphs: 32 %
MCH: 27.9 pg (ref 26.6–33.0)
MCHC: 33.6 g/dL (ref 31.5–35.7)
MCV: 83 fL (ref 79–97)
Monocytes Absolute: 0.9 10*3/uL (ref 0.1–0.9)
Monocytes: 7 %
Neutrophils Absolute: 6.9 10*3/uL (ref 1.4–7.0)
Neutrophils: 59 %
Platelets: 397 10*3/uL (ref 150–450)
RBC: 5.17 x10E6/uL (ref 3.77–5.28)
RDW: 12.9 % (ref 11.7–15.4)
WBC: 11.8 10*3/uL — ABNORMAL HIGH (ref 3.4–10.8)

## 2021-05-28 LAB — VITAMIN D 25 HYDROXY (VIT D DEFICIENCY, FRACTURES): Vit D, 25-Hydroxy: 13.1 ng/mL — ABNORMAL LOW (ref 30.0–100.0)

## 2021-05-28 LAB — IRON,TIBC AND FERRITIN PANEL
Ferritin: 331 ng/mL — ABNORMAL HIGH (ref 15–150)
Iron Saturation: 17 % (ref 15–55)
Iron: 48 ug/dL (ref 27–159)
Total Iron Binding Capacity: 283 ug/dL (ref 250–450)
UIBC: 235 ug/dL (ref 131–425)

## 2021-05-28 LAB — T4, FREE: Free T4: 1.2 ng/dL (ref 0.82–1.77)

## 2021-05-28 LAB — TSH: TSH: 1.24 u[IU]/mL (ref 0.450–4.500)

## 2021-05-29 ENCOUNTER — Other Ambulatory Visit (HOSPITAL_BASED_OUTPATIENT_CLINIC_OR_DEPARTMENT_OTHER): Payer: Self-pay

## 2021-05-29 DIAGNOSIS — E559 Vitamin D deficiency, unspecified: Secondary | ICD-10-CM

## 2021-05-29 MED ORDER — VITAMIN D (ERGOCALCIFEROL) 1.25 MG (50000 UNIT) PO CAPS: 50000.0000 [IU] | ORAL_CAPSULE | ORAL | 0 refills | Status: DC

## 2021-05-30 NOTE — Assessment & Plan Note (Signed)
Chronic.  Fairly well controlled at this time.  No current medications.  We will continue to monitor closely.  We will obtain labs today for further evaluation and make recommendations to any changes in the plan of care based on lab findings. ?

## 2021-05-30 NOTE — Assessment & Plan Note (Signed)
BMI 37.28 today. ?Difficulty losing weight despite diet and exercise changes.  Etiology unclear at this time. ?We will obtain labs today for evaluation and monitoring.  May consider possible medication therapy based on lab findings.  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  May also consider use of short-term treatment with something such as phentermine however we will need to monitor anxiety and depression as well while using this. ?We will make changes to the plan of care based on findings from lab work and follow-up with patient as necessary. ?

## 2021-05-30 NOTE — Assessment & Plan Note (Signed)
Chronic.  Well-controlled at this time.  No concerns. ?

## 2021-05-30 NOTE — Assessment & Plan Note (Signed)
Chronic.  No longer having menstrual cycles therefore expect improvement.  Will obtain labs today for further evaluation and monitoring. ?

## 2021-05-30 NOTE — Assessment & Plan Note (Signed)
Historical.  We will obtain labs today for monitoring and evaluation. ?

## 2021-05-30 NOTE — Assessment & Plan Note (Signed)
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  We will send in refill of EpiPen today.  Continue with strict avoidance of shellfish. ?

## 2021-06-03 ENCOUNTER — Encounter: Payer: Self-pay | Admitting: Podiatry

## 2021-06-09 ENCOUNTER — Ambulatory Visit (INDEPENDENT_AMBULATORY_CARE_PROVIDER_SITE_OTHER): Payer: 59

## 2021-06-09 ENCOUNTER — Ambulatory Visit (INDEPENDENT_AMBULATORY_CARE_PROVIDER_SITE_OTHER): Payer: 59 | Admitting: Podiatry

## 2021-06-09 DIAGNOSIS — Z9889 Other specified postprocedural states: Secondary | ICD-10-CM | POA: Diagnosis not present

## 2021-06-09 NOTE — Progress Notes (Signed)
Subjective:  Patient presents today for follow-up evaluation of right foot pain.  Patient states that by the end of the day her foot is swollen with increased pain and tenderness.  She said prior injections only helped temporarily.  Last surgery was a second metatarsal head resection of the right foot 02/06/2021.  She says that this area is not painful or symptomatic.  She presents for further treatment and evaluation  Past Medical History:  Diagnosis Date   Anemia    iron infusion sept 2020   Chicken pox    Depression    Esophagitis 12/24/2011   GERD (gastroesophageal reflux disease)    Menorrhagia    with irregular cycles   Migraines    Pneumonia 07/2018   Seasonal allergies    UTI (urinary tract infection) finished antibiotic 04-11-2019   Past Surgical History:  Procedure Laterality Date   ESOPHAGOGASTRODUODENOSCOPY  12/24/2011   Procedure: ESOPHAGOGASTRODUODENOSCOPY (EGD);  Surgeon: Inda Castle, MD;  Location: [facility];  Service: Endoscopy;  Laterality: N/A;   FOOT SURGERY Right 2009   repair remotely after a fracture   FOOT SURGERY Right 1'2020   HYSTEROSCOPY WITH NOVASURE N/A 04/25/2019   Procedure: HYSTEROSCOPY WITH NOVASURE ABLATION;  Surgeon: Megan Salon, MD;  Location: [facility];  Service: Gynecology;  Laterality: N/A;   KNEE ARTHROSCOPY Left 2018   Allergies  Allergen Reactions   Codeine Shortness Of Breath   Hydrocodone Shortness Of Breath   Iodine Other (See Comments)    Shellfish allergy   Shellfish Allergy Anaphylaxis   Aspirin Hives     Objective/Physical Exam Neurovascular status intact.  Skin incision healed.  This morning there is no edema however by the end of the day the patient states she does experience swelling to the foot.  There is no pain on palpation or range of motion.  The patient's majority of symptoms seem to be the second toe pressing against the hallux  Radiographic exam RT foot Radiographically the osteotomy to  the third MTP is stable with good routine healing.  There is some osteophyte formation within the second MTP space at the area of the metatarsal head resection.  This area is not symptomatic however and does not correlate clinically  Assessment: 1. s/p second metatarsal head resection right. DOS: 02/06/2021 2. PSxHx multiple prior surgeries beginning in [place] several years prior   Plan of Care:  1. Patient was evaluated. 2.  Today we discussed different treatment options and imaging modalities including MRI, additional conservative treatments which would include steroid injections, oral anti-inflammatories.  Patient states that she does get a significant amount of relief with topical Voltaren gel.  Continue 2 times daily 3.  The patient has had orthotics in the past and she recently received a new pair from our office.  She was very unsatisfied with the orthotics department and general and the comfort level of the orthotics.  She has had good success in the past with [facility] orthotics lab.  4.  Prescription for new custom molded orthotics provided for the patient to take to [place] lab 5.  Continue Voltaren gel since this seems to alleviate the patient's symptoms of pain 6.  Patient states that by the end of the day her great toe and second toe are rubbing against each other.  Silicone toe spacer was dispensed 7.  The patient is about 4 months postop.  Recommend continued conservative treatment for now to see if the patient's symptoms will alleviate over time  8.  Return to clinic as needed    Edrick Kins, DPM [facility]  Dr. Edrick Kins, DPM    [address], [place] [address]                Office [phone]  Fax [phone]

## 2021-06-12 ENCOUNTER — Ambulatory Visit
Admission: RE | Admit: 2021-06-12 | Discharge: 2021-06-12 | Disposition: A | Discharge status: Home / Self Care | Payer: 59 | Source: Ambulatory Visit | Attending: Chiropractic Medicine | Admitting: Chiropractic Medicine

## 2021-06-12 ENCOUNTER — Other Ambulatory Visit: Payer: Self-pay | Admitting: Chiropractic Medicine

## 2021-06-12 DIAGNOSIS — R52 Pain, unspecified: Secondary | ICD-10-CM

## 2021-06-18 ENCOUNTER — Ambulatory Visit: Payer: 59 | Admitting: Podiatry

## 2021-07-16 ENCOUNTER — Encounter: Payer: Self-pay | Admitting: Family

## 2021-07-16 ENCOUNTER — Encounter (HOSPITAL_BASED_OUTPATIENT_CLINIC_OR_DEPARTMENT_OTHER): Payer: Self-pay | Admitting: Nurse Practitioner

## 2021-07-16 ENCOUNTER — Other Ambulatory Visit (HOSPITAL_BASED_OUTPATIENT_CLINIC_OR_DEPARTMENT_OTHER): Payer: Self-pay

## 2021-07-16 ENCOUNTER — Ambulatory Visit (INDEPENDENT_AMBULATORY_CARE_PROVIDER_SITE_OTHER): Payer: 59 | Admitting: Nurse Practitioner

## 2021-07-16 VITALS — BP 117/78 | HR 96 | Ht 66.0 in | Wt 234.6 lb

## 2021-07-16 DIAGNOSIS — H66003 Acute suppurative otitis media without spontaneous rupture of ear drum, bilateral: Secondary | ICD-10-CM | POA: Diagnosis not present

## 2021-07-16 MED ORDER — PREDNISONE 20 MG PO TABS
40.0000 mg | ORAL_TABLET | Freq: Every day | ORAL | 0 refills | Status: DC
  Filled 2021-07-16: qty 10, 5d supply, fill #0

## 2021-07-16 MED ORDER — AMOXICILLIN-POT CLAVULANATE 875-125 MG PO TABS
1.0000 | ORAL_TABLET | Freq: Two times a day (BID) | ORAL | 0 refills | Status: DC
  Filled 2021-07-16: qty 10, 5d supply, fill #0

## 2021-07-16 MED ORDER — FLUCONAZOLE 150 MG PO TABS
ORAL_TABLET | ORAL | 2 refills | Status: DC
  Filled 2021-07-16: qty 2, 3d supply, fill #0

## 2021-07-16 NOTE — Patient Instructions (Signed)
The following information is provided as a general resource for ADULT patients only and does NOT take into account PREGNANCY, ALLERGIES, LIVER CONDITIONS, KIDNEY CONDITIONS, GASTROINTESTINAL CONDITIONS, OR PRESCRIPTION MEDICATION INTERACTIONS. Please be sure to ask your provider if the following are safe to take with your specific medical history, conditions, or current medication regimen if you are unsure.   Adult Basic Symptom Management for Sinusitis  Congestion: Guaifenesin (Mucinex)- follow directions on packaging with a maximum dose of 2400mg in a 24 hour period.  Pain/Fever: Ibuprofen 200mg - 400mg every 4-6 hours as needed (MAX 1200mg in a 24 hour period) Pain/Fever: Tylenol 500mg -1000mg every 6-8 hours as needed (MAX 3000mg in a 24 hour period)  Cough: Dextromethorphan (Delsym)- follow directions on packing with a maximum dose of 120mg in a 24 hour period.  Nasal Stuffiness: Saline nasal spray and/or Nettie Pot with sterile saline solution  Runny Nose: Fluticasone nasal spray (Flonase) OR Mometasone nasal spray (Nasonex) OR Triamcinolone Acetonide nasal spray (Nasacort)- follow directions on the packaging  Pain/Pressure: Warm washcloth to the face  Sore Throat: Warm salt water gargles  If you have allergies, you may also consider taking an oral antihistamine (like Zyrtec or Claritin) as these may also help with your symptoms.  **Many medications will have more than one ingredient, be sure you are reading the packaging carefully and not taking more than one dose of the same kind of medication at the same time or too close together. It is OK to use formulas that have all of the ingredients you want, but do not take them in a combined medication and as separate dose too close together. If you have any questions, the pharmacist will be happy to help you decide what is safe.    

## 2021-07-16 NOTE — Progress Notes (Signed)
  Orma Render, DNP, AGNP-c [facility] [address]  [place],  [address] [phone]  Subjective:   Betty Leonard is a 41 y.o. female presents to day for ear pain.  Betty Leonard endorses left sided ear pain that has been present for the last several days. She also endorses pressure/fullness in the ear.  She has had subjective fevers. She is also endorsing sinus pain and pressure bilaterally in both frontal and maxillary sinuses She denies drainage from the ear, or loss of hearing.   PMH, Medications, and Allergies reviewed and updated in chart.   ROS negative except for what is listed in HPI. Objective:  BP 117/78   Pulse 96   Ht '5\' 6"'$  (1.676 m)   Wt 234 lb 9.6 oz (106.4 kg)   SpO2 99%   BMI 37.87 kg/m  Physical Exam Vitals and nursing note reviewed.  Constitutional:      Appearance: She is ill-appearing.  HENT:     Head: Normocephalic.     Right Ear: Swelling and tenderness present. A middle ear effusion is present. Tympanic membrane is erythematous and retracted.     Left Ear: Swelling and tenderness present. A middle ear effusion is present. Tympanic membrane is erythematous and bulging.     Nose: Congestion present.     Right Sinus: Maxillary sinus tenderness and frontal sinus tenderness present.     Left Sinus: Maxillary sinus tenderness and frontal sinus tenderness present.     Mouth/Throat:     Pharynx: Oropharynx is clear. Uvula midline. Posterior oropharyngeal erythema present. No pharyngeal swelling or oropharyngeal exudate.  Cardiovascular:     Rate and Rhythm: Normal rate and regular rhythm.     Pulses: Normal pulses.     Heart sounds: Normal heart sounds.  Pulmonary:     Effort: Pulmonary effort is normal.     Breath sounds: Normal breath sounds.  Neurological:     Mental Status: She is alert.         Assessment & Plan:   Problem List Items Addressed This Visit     Non-recurrent acute suppurative  otitis media of both ears without spontaneous rupture of tympanic membranes - Primary    TM erythematous with effusion present bilaterally consistent with otitis media  Congestion and facial pain and pressure raise suspicion for concurrent sinusitis. Recommend over-the-counter pain relievers such as acetaminophen or ibuprofen to help with ear pain.  Given the significance of the symptoms currently we will send treatment with antibiotic therapy and steroid burst to help reduce inflammation. We will also send Diflucan for treatment of vaginal candidiasis at this develops due to antibiotic use. Patient will follow-up if no improvement in the next 72 hours or if symptoms worsen or new symptoms develop after starting antibiotics.      Relevant Medications   amoxicillin-clavulanate (AUGMENTIN) 875-125 MG tablet   predniSONE (DELTASONE) 20 MG tablet   fluconazole (DIFLUCAN) 150 MG tablet     Orma Render, DNP, AGNP-c 08/05/2021  12:18 PM

## 2021-08-05 ENCOUNTER — Encounter (HOSPITAL_BASED_OUTPATIENT_CLINIC_OR_DEPARTMENT_OTHER): Payer: Self-pay | Admitting: Nurse Practitioner

## 2021-08-05 DIAGNOSIS — H66003 Acute suppurative otitis media without spontaneous rupture of ear drum, bilateral: Secondary | ICD-10-CM | POA: Insufficient documentation

## 2021-08-05 DIAGNOSIS — H66004 Acute suppurative otitis media without spontaneous rupture of ear drum, recurrent, right ear: Secondary | ICD-10-CM | POA: Insufficient documentation

## 2021-08-05 HISTORY — DX: Acute suppurative otitis media without spontaneous rupture of ear drum, recurrent, right ear: H66.004

## 2021-08-05 NOTE — Assessment & Plan Note (Signed)
TM erythematous with effusion present bilaterally consistent with otitis media  Congestion and facial pain and pressure raise suspicion for concurrent sinusitis. Recommend over-the-counter pain relievers such as acetaminophen or ibuprofen to help with ear pain.  Given the significance of the symptoms currently we will send treatment with antibiotic therapy and steroid burst to help reduce inflammation. We will also send Diflucan for treatment of vaginal candidiasis at this develops due to antibiotic use. Patient will follow-up if no improvement in the next 72 hours or if symptoms worsen or new symptoms develop after starting antibiotics.

## 2021-08-20 ENCOUNTER — Telehealth (HOSPITAL_BASED_OUTPATIENT_CLINIC_OR_DEPARTMENT_OTHER): Payer: Self-pay | Admitting: Nurse Practitioner

## 2021-08-20 ENCOUNTER — Other Ambulatory Visit (HOSPITAL_BASED_OUTPATIENT_CLINIC_OR_DEPARTMENT_OTHER): Payer: Self-pay | Admitting: Nurse Practitioner

## 2021-08-20 DIAGNOSIS — E559 Vitamin D deficiency, unspecified: Secondary | ICD-10-CM

## 2021-08-20 NOTE — Telephone Encounter (Signed)
Pt called needing refills on medication(s)  Vitamin D, Ergocalciferol, (DRISDOL) 1.25 MG (50000 UNIT) CAPS capsule [[id number]   Sent to this pharmacy:  [id number] [place] [id number]   Pt has this many left:  Did not say.    Pt stated that she requested a refill on this but was denied. Possible mistake.  Please advise.

## 2021-08-21 ENCOUNTER — Ambulatory Visit (INDEPENDENT_AMBULATORY_CARE_PROVIDER_SITE_OTHER): Payer: 59 | Admitting: Family Medicine

## 2021-08-21 ENCOUNTER — Ambulatory Visit (HOSPITAL_BASED_OUTPATIENT_CLINIC_OR_DEPARTMENT_OTHER): Payer: 59

## 2021-08-21 ENCOUNTER — Encounter (HOSPITAL_BASED_OUTPATIENT_CLINIC_OR_DEPARTMENT_OTHER): Payer: Self-pay | Admitting: Family Medicine

## 2021-08-21 DIAGNOSIS — U071 COVID-19: Secondary | ICD-10-CM | POA: Diagnosis not present

## 2021-08-21 MED ORDER — NIRMATRELVIR/RITONAVIR (PAXLOVID)TABLET: 3.0000 | ORAL_TABLET | Freq: Two times a day (BID) | ORAL | 0 refills | Status: AC

## 2021-08-21 NOTE — Assessment & Plan Note (Signed)
Patient is a 41 year old female with recent onset of symptoms including fever, sinus congestion, headache, rhinorrhea.  She was seen at urgent care last night and tested positive for COVID-19.  She reports that starting Paxlovid was discussed with her, however she decided to hold off on starting medication due to concerns for possible side effects or medication interactions.  She has been utilizing OTC measures including DayQuil.  She was also prescribed medication to help with nausea by the urgent care.  Today she has questions about possibly being started on Paxlovid. During encounter, patient able to speak in complete sentences, no audible heavy breathing or dyspnea. We did discuss Paxlovid and indications related to this.  She has received initial vaccinations as well as booster for coronavirus.  Does have risk factors including obesity with most recent BMI of 37.  Discussed possibility of initiating Paxlovid and she would like to do so.  Last evaluation of kidney function was little bit over 1 year ago, would recommend updating this to ensure no new kidney issues and if so, able to proceed with full dose of Paxlovid Discussed additional conservative measures including nasal saline spray, intranasal steroid spray, additional OTC medications to consider to help with symptoms Recommend ensuring adequate hydration, rest Discussed expected recovery course Plan for follow-up as needed

## 2021-08-21 NOTE — Progress Notes (Signed)
   Virtual Visit via Telephone   I connected with  Betty Leonard  on 08/21/21 by telephone/telehealth and verified that I am speaking with the correct person using two identifiers.   I discussed the limitations, risks, security and privacy concerns of performing an evaluation and management service by telephone, including the higher likelihood of inaccurate diagnosis and treatment, and the availability of in person appointments.  We also discussed the likely need of an additional face to face encounter for complete and high quality delivery of care.  I also discussed with the patient that there may be a patient responsible charge related to this service. The patient expressed understanding and wishes to proceed.  Provider location is in medical facility. Patient location is at their home, different from provider location. People involved in care of the patient during this telehealth encounter were myself, my nurse/medical assistant, and my front office/scheduling team member.  Review of Systems: No fevers, chills, night sweats, weight loss, chest pain, or shortness of breath.   Objective Findings:    General: Speaking full sentences, no audible heavy breathing.  Sounds alert and appropriately interactive.    Independent interpretation of tests performed by another provider:   None.  Brief History, Exam, Impression, and Recommendations:    [name] is a 41 year old female with recent onset of symptoms including fever, sinus congestion, headache, rhinorrhea.  She was seen at urgent care last night and tested positive for COVID-19.  She reports that starting Paxlovid was discussed with her, however she decided to hold off on starting medication due to concerns for possible side effects or medication interactions.  She has been utilizing OTC measures including DayQuil.  She was also prescribed medication to help with nausea by the urgent care.  Today she has questions about possibly  being started on Paxlovid. During encounter, patient able to speak in complete sentences, no audible heavy breathing or dyspnea. We did discuss Paxlovid and indications related to this.  She has received initial vaccinations as well as booster for coronavirus.  Does have risk factors including obesity with most recent BMI of 37.  Discussed possibility of initiating Paxlovid and she would like to do so.  Last evaluation of kidney function was little bit over 1 year ago, would recommend updating this to ensure no new kidney issues and if so, able to proceed with full dose of Paxlovid Discussed additional conservative measures including nasal saline spray, intranasal steroid spray, additional OTC medications to consider to help with symptoms Recommend ensuring adequate hydration, rest Discussed expected recovery course Plan for follow-up as needed  I discussed the above assessment and treatment plan with the patient. The patient was provided an opportunity to ask questions and all were answered. The patient agreed with the plan and demonstrated an understanding of the instructions.  The patient was advised to call back or seek an in-person evaluation if the symptoms worsen or if the condition fails to improve as anticipated.  I provided 10 minutes of face to face and non-face-to-face time during this encounter date, time was needed to gather information, review chart, records, communicate/coordinate with staff remotely, as well as complete documentation.   ___________________________________________ Rocky Ris[place], MD, ABFM, CAQSM [facility]

## 2021-08-21 NOTE — Telephone Encounter (Signed)
Called pt and let her know. She stated she will cb when she is better after testing positive for covid on 8/2 to schedule this appt.

## 2021-08-22 LAB — BASIC METABOLIC PANEL
BUN/Creatinine Ratio: 15 (ref 9–23)
BUN: 9 mg/dL (ref 6–24)
CO2: 18 mmol/L — ABNORMAL LOW (ref 20–29)
Calcium: 9 mg/dL (ref 8.7–10.2)
Chloride: 103 mmol/L (ref 96–106)
Creatinine, Ser: 0.61 mg/dL (ref 0.57–1.00)
Glucose: 89 mg/dL (ref 70–99)
Potassium: 4.2 mmol/L (ref 3.5–5.2)
Sodium: 138 mmol/L (ref 134–144)
eGFR: 115 mL/min/{1.73_m2} (ref >59)

## 2021-08-27 ENCOUNTER — Ambulatory Visit (HOSPITAL_BASED_OUTPATIENT_CLINIC_OR_DEPARTMENT_OTHER): Payer: 59 | Admitting: Orthopaedic Surgery

## 2021-09-01 ENCOUNTER — Encounter (HOSPITAL_BASED_OUTPATIENT_CLINIC_OR_DEPARTMENT_OTHER): Payer: Self-pay | Admitting: Nurse Practitioner

## 2021-09-01 ENCOUNTER — Ambulatory Visit (INDEPENDENT_AMBULATORY_CARE_PROVIDER_SITE_OTHER): Payer: 59 | Admitting: Nurse Practitioner

## 2021-09-01 DIAGNOSIS — E559 Vitamin D deficiency, unspecified: Secondary | ICD-10-CM | POA: Diagnosis not present

## 2021-09-01 DIAGNOSIS — R42 Dizziness and giddiness: Secondary | ICD-10-CM

## 2021-09-01 MED ORDER — MECLIZINE HCL 25 MG PO TABS: 25.0000 mg | ORAL_TABLET | Freq: Three times a day (TID) | ORAL | 3 refills | Status: DC | PRN

## 2021-09-01 MED ORDER — VITAMIN D (ERGOCALCIFEROL) 1.25 MG (50000 UNIT) PO CAPS
50000.0000 [IU] | ORAL_CAPSULE | ORAL | 3 refills | Status: DC
  Filled 2021-12-04: qty 4, 28d supply, fill #0
  Filled 2021-12-29: qty 4, 28d supply, fill #1
  Filled 2022-04-12: qty 4, 28d supply, fill #2

## 2021-09-01 MED ORDER — PREDNISONE 50 MG PO TABS
50.0000 mg | ORAL_TABLET | Freq: Every day | ORAL | 0 refills | Status: DC
Start: 2021-09-01 — End: 2021-11-12

## 2021-09-01 NOTE — Patient Instructions (Addendum)
I have sent in meclizine for you to take three times a day for the dizziness. I have also sent in the prednisone burst to help with the inflammation. If the medication is not helping at all please let me know.   I have included the movements you can do to help with the dizziness.   I hope you feel better soon.

## 2021-09-01 NOTE — Progress Notes (Signed)
Virtual Visit Encounter telephone visit.   I connected with  Betty Leonard on 10/05/21 at 10:50 AM EDT by secure audio telemedicine application. I verified that I am speaking with the correct person using two identifiers.   I introduced myself as a Designer, jewellery with the practice. The limitations of evaluation and management by telemedicine discussed with the patient and the availability of in person appointments. The patient expressed verbal understanding and consent to proceed.  Participating parties in this visit include: Myself and patient  The patient is: Patient Location: Home I am: Provider Location: Office/Clinic Subjective:    CC and HPI: Betty Leonard is a 41 y.o. year old female presenting for follow up of [place].  Went to UC for dyspnea, high fevers, low O2, body aches, and fatigue. She was started on Paxlovid for treatment and completed full course. She tolerated well. She did loose her taste and smell.   She has been having dizziness since the onset of covid.  All other COVID symptoms have resolved with exception of dizziness. She reports that she is having a hard time sitting at work looking at the computer due to the symptoms   Past medical history, Surgical history, Family history not pertinant except as noted below, Social history, Allergies, and medications have been entered into the medical record, reviewed, and corrections made.   Review of Systems:  All review of systems negative except what is listed in the HPI  Objective:    Alert and oriented x 4 Speaking in clear sentences with no shortness of breath. No distress.  Impression and Recommendations:    Problem List Items Addressed This Visit     Vertigo - Primary    Visit 08/14: Vertigo symptoms onset during recent COVID infection. She is currently still experiencing ongoing dizziness and intermittent nausea. Symptoms are making her work difficult. No alarm sx present. Recommend meclizine for  management. Will also send steroid burst to help reduce inflammatory process from COVID that is likely contributing. Exercises can be performed and found on youtube with good videos when looking up "vertigo therapy movements". If symptoms do not improve, recommend in person evaluation.       Relevant Medications   predniSONE (DELTASONE) 50 MG tablet   meclizine (ANTIVERT) 25 MG tablet   Other Visit Diagnoses     Vitamin D deficiency       Relevant Medications   Vitamin D, Ergocalciferol, (DRISDOL) 1.25 MG (50000 UNIT) CAPS capsule       orders and follow up as documented in EMR I discussed the assessment and treatment plan with the patient. The patient was provided an opportunity to ask questions and all were answered. The patient agreed with the plan and demonstrated an understanding of the instructions.   The patient was advised to call back or seek an in-person evaluation if the symptoms worsen or if the condition fails to improve as anticipated.  Follow-Up: prn  I provided 18 minutes of non-face-to-face interaction with this non face-to-face encounter including intake, same-day documentation, and chart review.   Orma Render, NP , DNP, AGNP-c [place] [facility] [phone] (fax)

## 2021-09-02 ENCOUNTER — Telehealth (HOSPITAL_BASED_OUTPATIENT_CLINIC_OR_DEPARTMENT_OTHER): Payer: Self-pay | Admitting: Nurse Practitioner

## 2021-09-02 NOTE — Telephone Encounter (Signed)
Pt is call to advise that she is feeling worse

## 2021-09-03 NOTE — Telephone Encounter (Signed)
Please call patient to see how she is feeling and if she is taking the meclizine as prescribed for the dizziness.

## 2021-10-03 ENCOUNTER — Encounter: Payer: Self-pay | Admitting: Podiatry

## 2021-10-05 DIAGNOSIS — R42 Dizziness and giddiness: Secondary | ICD-10-CM | POA: Insufficient documentation

## 2021-10-05 HISTORY — DX: Dizziness and giddiness: R42

## 2021-10-05 NOTE — Assessment & Plan Note (Signed)
Visit 08/14: Vertigo symptoms onset during recent COVID infection. She is currently still experiencing ongoing dizziness and intermittent nausea. Symptoms are making her work difficult. No alarm sx present. Recommend meclizine for management. Will also send steroid burst to help reduce inflammatory process from COVID that is likely contributing. Exercises can be performed and found on youtube with good videos when looking up "vertigo therapy movements". If symptoms do not improve, recommend in person evaluation.

## 2021-10-06 ENCOUNTER — Telehealth: Payer: Self-pay | Admitting: *Deleted

## 2021-10-06 NOTE — Telephone Encounter (Signed)
Please schedule appt this week for evaluation. Thanks, Dr. Amalia Hailey

## 2021-10-06 NOTE — Telephone Encounter (Signed)
Patient is calling because she fell while out of town on Saturday, heard a crack and now foot is painful. Has been taking ibuprofen, not really helping.She said that she has fallen about 4 times since surgery. Please advise/ schedule for an appointment for evaluation.

## 2021-10-08 ENCOUNTER — Ambulatory Visit (INDEPENDENT_AMBULATORY_CARE_PROVIDER_SITE_OTHER): Payer: 59 | Admitting: Podiatry

## 2021-10-08 ENCOUNTER — Ambulatory Visit (INDEPENDENT_AMBULATORY_CARE_PROVIDER_SITE_OTHER): Payer: 59

## 2021-10-08 DIAGNOSIS — M778 Other enthesopathies, not elsewhere classified: Secondary | ICD-10-CM

## 2021-10-08 NOTE — Progress Notes (Signed)
   Chief Complaint  Patient presents with   Follow-up    Patient is here for right foot follow-up.    Subjective:  Patient presents today for evaluation of a new complaint regarding an injury that was sustained this past Thursday, 10/02/2021 when the patient fell on some stairs that she was going up the stairs she states that her right foot was caught on the edge of the stair.  She had pain and tenderness and presents for evaluation.  Currently she has not done anything for treatment  Past Medical History:  Diagnosis Date   Anemia    iron infusion sept 2020   Chicken pox    Depression    Esophagitis 12/24/2011   GERD (gastroesophageal reflux disease)    History of wheezing 06/06/2018   Menorrhagia    with irregular cycles   Migraines    Pneumonia 07/2018   Seasonal allergies    UTI (urinary tract infection) finished antibiotic 04-11-2019   Past Surgical History:  Procedure Laterality Date   ESOPHAGOGASTRODUODENOSCOPY  12/24/2011   Procedure: ESOPHAGOGASTRODUODENOSCOPY (EGD);  Surgeon: Inda Castle, MD;  Location: [facility];  Service: Endoscopy;  Laterality: N/A;   FOOT SURGERY Right 2009   repair remotely after a fracture   FOOT SURGERY Right 1'2020   HYSTEROSCOPY WITH NOVASURE N/A 04/25/2019   Procedure: HYSTEROSCOPY WITH NOVASURE ABLATION;  Surgeon: Megan Salon, MD;  Location: [facility];  Service: Gynecology;  Laterality: N/A;   KNEE ARTHROSCOPY Left 2018   Allergies  Allergen Reactions   Codeine Shortness Of Breath   Hydrocodone Shortness Of Breath   Iodine Other (See Comments)    Shellfish allergy   Shellfish Allergy Anaphylaxis   Aspirin Hives     Objective/Physical Exam Neurovascular status intact.  Skin incision healed.  No edema or erythema.  There is some moderate tenderness to palpation to the first and second MTP of the foot.  Radiographic exam RT foot No significant change since prior x-rays.  Absence of the second and third  metatarsals noted.  Hardware noted to the base of the proximal portion of the first metatarsal which appears stable and intact.  Assessment: 1. PSxHx second and third metatarsal head resections right.  2. PSxHx multiple prior surgeries beginning in [place] several years prior   Plan of Care:  1. Patient was evaluated. 2.  Overall the patient states that she was doing well prior to her fall that occurred this past Thursday.  We are going to place her in immobilization cam boot weightbearing as tolerated x3-4 weeks.  After 4 weeks the patient may transition back into good supportive shoes and sneakers 3.  Patient gets the most relief from topical Voltaren gel.  Continue. 4.  She declined oral NSAIDs 5.  Return to clinic as needed    Edrick Kins, DPM [facility]  Dr. Edrick Kins, DPM    [address], [place] [address]                Office [phone]  Fax [phone]

## 2021-10-31 ENCOUNTER — Ambulatory Visit (INDEPENDENT_AMBULATORY_CARE_PROVIDER_SITE_OTHER): Payer: 59 | Admitting: Nurse Practitioner

## 2021-10-31 ENCOUNTER — Encounter (HOSPITAL_BASED_OUTPATIENT_CLINIC_OR_DEPARTMENT_OTHER): Payer: Self-pay | Admitting: Nurse Practitioner

## 2021-10-31 DIAGNOSIS — F5104 Psychophysiologic insomnia: Secondary | ICD-10-CM

## 2021-10-31 DIAGNOSIS — F32A Depression, unspecified: Secondary | ICD-10-CM | POA: Diagnosis not present

## 2021-10-31 DIAGNOSIS — F419 Anxiety disorder, unspecified: Secondary | ICD-10-CM

## 2021-10-31 MED ORDER — HYDROXYZINE HCL 50 MG PO TABS
25.0000 mg | ORAL_TABLET | Freq: Every evening | ORAL | 3 refills | Status: DC | PRN
  Filled 2021-12-04: qty 30, 30d supply, fill #0

## 2021-10-31 MED ORDER — SERTRALINE HCL 50 MG PO TABS
ORAL_TABLET | ORAL | 3 refills | Status: DC
  Filled 2021-12-04: qty 30, 30d supply, fill #0
  Filled 2022-02-22: qty 30, 30d supply, fill #1
  Filled 2022-04-12: qty 30, 30d supply, fill #2

## 2021-10-31 NOTE — Progress Notes (Signed)
Virtual Visit Encounter telephone visit.   I connected with  Betty Leonard on 11/18/21 at  8:30 AM EDT by secure audio telemedicine application. I verified that I am speaking with the correct person using two identifiers.   I introduced myself as a Designer, jewellery with the practice. The limitations of evaluation and management by telemedicine discussed with the patient and the availability of in person appointments. The patient expressed verbal understanding and consent to proceed.  Participating parties in this visit include: Myself and patient  The patient is: Patient Location: Home I am: Provider Location: Office/Clinic Subjective:    CC and HPI: Betty Leonard is a 41 y.o. year old female presenting for follow up of anxiety. Patient reports the following: Anxiety Betty Leonard endorses significant anxiety symptoms.  She tells me that she is working herself up to the point of throwing up and even blacking out at times.  She endorses significant difficulty sleeping.  She tells me that her lack of sleep is making her symptoms much worse and this much more difficult to deal with.  At this time she feels that a daily medication would be best for her management.  She denies SI/HI.  Past medical history, Surgical history, Family history not pertinant except as noted below, Social history, Allergies, and medications have been entered into the medical record, reviewed, and corrections made.   Review of Systems:  All review of systems negative except what is listed in the HPI  Objective:    Alert and oriented x 4 Speaking in clear sentences with no shortness of breath. No distress.  Impression and Recommendations:    Problem List Items Addressed This Visit     Anxiety and depression - Primary    Chronic with recent exacerbation.  Previous evaluation patient was managing her symptoms quite well without medication intervention.  At this time she does feel that her symptoms have significantly  exacerbated and she is having difficulty sleeping and frequent panic attacks with significant somatic symptoms.  At this time I do feel that medication management is appropriate for her current condition.  She is not having any alarm symptoms at this time.  Joint decision made to trial sertraline daily and add hydroxyzine for sleep and panic.  We will plan to follow-up in the next 4 weeks to ensure that she is doing well on the medication.  Patient is aware to reach out if new or worsening symptoms develop at any point.      Relevant Medications   sertraline (ZOLOFT) 50 MG tablet   hydrOXYzine (ATARAX) 50 MG tablet   Other Visit Diagnoses     Psychophysiological insomnia       Relevant Medications   hydrOXYzine (ATARAX) 50 MG tablet       orders and follow up as documented in EMR I discussed the assessment and treatment plan with the patient. The patient was provided an opportunity to ask questions and all were answered. The patient agreed with the plan and demonstrated an understanding of the instructions.   The patient was advised to call back or seek an in-person evaluation if the symptoms worsen or if the condition fails to improve as anticipated.  Follow-Up: in a month  I provided 22 minutes of non-face-to-face interaction with this non face-to-face encounter including intake, same-day documentation, and chart review.   Orma Render, NP , DNP, AGNP-c [place] [facility] [phone] (fax)

## 2021-11-06 ENCOUNTER — Encounter (HOSPITAL_BASED_OUTPATIENT_CLINIC_OR_DEPARTMENT_OTHER): Payer: Self-pay | Admitting: Nurse Practitioner

## 2021-11-12 ENCOUNTER — Encounter (HOSPITAL_BASED_OUTPATIENT_CLINIC_OR_DEPARTMENT_OTHER): Payer: Self-pay | Admitting: Family Medicine

## 2021-11-12 ENCOUNTER — Other Ambulatory Visit (HOSPITAL_BASED_OUTPATIENT_CLINIC_OR_DEPARTMENT_OTHER): Payer: Self-pay

## 2021-11-12 ENCOUNTER — Ambulatory Visit (INDEPENDENT_AMBULATORY_CARE_PROVIDER_SITE_OTHER): Payer: 59 | Admitting: Family Medicine

## 2021-11-12 DIAGNOSIS — J019 Acute sinusitis, unspecified: Secondary | ICD-10-CM | POA: Diagnosis not present

## 2021-11-12 DIAGNOSIS — J329 Chronic sinusitis, unspecified: Secondary | ICD-10-CM | POA: Insufficient documentation

## 2021-11-12 HISTORY — DX: Chronic sinusitis, unspecified: J32.9

## 2021-11-12 MED ORDER — AMOXICILLIN 875 MG PO TABS
875.0000 mg | ORAL_TABLET | Freq: Two times a day (BID) | ORAL | 0 refills | Status: AC
  Filled 2021-11-12: qty 10, 5d supply, fill #0

## 2021-11-12 MED ORDER — FLUCONAZOLE 150 MG PO TABS
150.0000 mg | ORAL_TABLET | Freq: Once | ORAL | 0 refills | Status: AC
  Filled 2021-11-12: qty 1, 1d supply, fill #0

## 2021-11-12 NOTE — Progress Notes (Signed)
    Procedures performed today:    None.  Independent interpretation of notes and tests performed by another provider:   None.  Brief History, Exam, Impression, and Recommendations:    BP 120/88   Pulse 84   Temp 97.9 F (36.6 C) (Oral)   Ht '5\' 6"'$  (1.676 m)   Wt 215 lb (97.5 kg)   SpO2 98%   BMI 34.70 kg/m   Sinusitis Patient reports that about 3 to 4 days ago, she began to have some sinus pressure and ear pressure.  She has been having postnasal drip, occasional cough.  Denies any fever, chills, sweats, body aches, shortness of breath.  She has been around others who have had recent upper respiratory illnesses.  She is tolerating p.o. intake, resting appropriately.  Has been using OTC medications including oral antihistamine to help with symptoms. On exam, patient is in no acute distress, vital signs stable, she is afebrile.  Cardiovascular exam with regular rate and rhythm, no murmur appreciated.  Lungs clear to auscultation bilaterally.  Bilateral tympanic membranes with small amount of fluid behind the TMs, no significant bulging or purulent material noted.  She does have mild tenderness to palpation over frontal and maxillary sinuses.  Oropharynx is clear.  No significant cervical lymphadenopathy Suspect that current symptoms are likely viral in origin and would recommend continuing with symptomatic measures at this time.  Did discuss that occasionally, viral sinusitis can progress to superimposed bacterial infection.  Discussed that this is typically seen when symptoms persist beyond 7 to 10 days and oftentimes we can notice some symptom improvement followed by worsening of symptoms.  Today, we will will send in prescription for antibiotics for patient to have on hand in the case that she does have continued/worsening symptoms which would be suggestive of bacterial sinusitis.  Return if symptoms worsen or fail to improve.   ___________________________________________ Adna Nofziger de  [place], MD, ABFM, [facility] [place]

## 2021-11-12 NOTE — Patient Instructions (Signed)

## 2021-11-12 NOTE — Assessment & Plan Note (Signed)
Patient reports that about 3 to 4 days ago, she began to have some sinus pressure and ear pressure.  She has been having postnasal drip, occasional cough.  Denies any fever, chills, sweats, body aches, shortness of breath.  She has been around others who have had recent upper respiratory illnesses.  She is tolerating p.o. intake, resting appropriately.  Has been using OTC medications including oral antihistamine to help with symptoms. On exam, patient is in no acute distress, vital signs stable, she is afebrile.  Cardiovascular exam with regular rate and rhythm, no murmur appreciated.  Lungs clear to auscultation bilaterally.  Bilateral tympanic membranes with small amount of fluid behind the TMs, no significant bulging or purulent material noted.  She does have mild tenderness to palpation over frontal and maxillary sinuses.  Oropharynx is clear.  No significant cervical lymphadenopathy Suspect that current symptoms are likely viral in origin and would recommend continuing with symptomatic measures at this time.  Did discuss that occasionally, viral sinusitis can progress to superimposed bacterial infection.  Discussed that this is typically seen when symptoms persist beyond 7 to 10 days and oftentimes we can notice some symptom improvement followed by worsening of symptoms.  Today, we will will send in prescription for antibiotics for patient to have on hand in the case that she does have continued/worsening symptoms which would be suggestive of bacterial sinusitis.

## 2021-11-17 ENCOUNTER — Encounter (HOSPITAL_BASED_OUTPATIENT_CLINIC_OR_DEPARTMENT_OTHER): Payer: Self-pay | Admitting: Nurse Practitioner

## 2021-11-17 DIAGNOSIS — Z8349 Family history of other endocrine, nutritional and metabolic diseases: Secondary | ICD-10-CM

## 2021-11-18 ENCOUNTER — Encounter (HOSPITAL_BASED_OUTPATIENT_CLINIC_OR_DEPARTMENT_OTHER): Payer: Self-pay | Admitting: Nurse Practitioner

## 2021-11-18 NOTE — Assessment & Plan Note (Signed)
Chronic with recent exacerbation.  Previous evaluation patient was managing her symptoms quite well without medication intervention.  At this time she does feel that her symptoms have significantly exacerbated and she is having difficulty sleeping and frequent panic attacks with significant somatic symptoms.  At this time I do feel that medication management is appropriate for her current condition.  She is not having any alarm symptoms at this time.  Joint decision made to trial sertraline daily and add hydroxyzine for sleep and panic.  We will plan to follow-up in the next 4 weeks to ensure that she is doing well on the medication.  Patient is aware to reach out if new or worsening symptoms develop at any point.

## 2021-11-25 ENCOUNTER — Other Ambulatory Visit (HOSPITAL_BASED_OUTPATIENT_CLINIC_OR_DEPARTMENT_OTHER): Payer: Self-pay

## 2021-11-25 ENCOUNTER — Encounter (HOSPITAL_BASED_OUTPATIENT_CLINIC_OR_DEPARTMENT_OTHER): Payer: Self-pay | Admitting: Nurse Practitioner

## 2021-11-25 ENCOUNTER — Ambulatory Visit (INDEPENDENT_AMBULATORY_CARE_PROVIDER_SITE_OTHER): Payer: 59 | Admitting: Nurse Practitioner

## 2021-11-25 VITALS — BP 126/80 | HR 100 | Ht 66.0 in | Wt 230.0 lb

## 2021-11-25 DIAGNOSIS — H66004 Acute suppurative otitis media without spontaneous rupture of ear drum, recurrent, right ear: Secondary | ICD-10-CM | POA: Diagnosis not present

## 2021-11-25 DIAGNOSIS — H66003 Acute suppurative otitis media without spontaneous rupture of ear drum, bilateral: Secondary | ICD-10-CM | POA: Diagnosis not present

## 2021-11-25 MED ORDER — DEXAMETHASONE SODIUM PHOSPHATE 10 MG/ML IJ SOLN
10.0000 mg | Freq: Once | INTRAMUSCULAR | Status: AC
  Administered 2021-11-25: 10 mg via INTRAMUSCULAR

## 2021-11-25 MED ORDER — FLUCONAZOLE 150 MG PO TABS
ORAL_TABLET | ORAL | 2 refills | Status: DC
  Filled 2021-11-25: qty 2, 3d supply, fill #0

## 2021-11-25 MED ORDER — KETOROLAC TROMETHAMINE 60 MG/2ML IM SOLN
60.0000 mg | Freq: Once | INTRAMUSCULAR | Status: AC
  Administered 2021-11-25: 60 mg via INTRAMUSCULAR

## 2021-11-25 MED ORDER — CEFTRIAXONE SODIUM 500 MG IJ SOLR
500.0000 mg | Freq: Once | INTRAMUSCULAR | Status: AC
  Administered 2021-11-25: 500 mg via INTRAMUSCULAR

## 2021-11-25 MED ORDER — LEVOFLOXACIN 500 MG PO TABS
500.0000 mg | ORAL_TABLET | Freq: Every day | ORAL | 0 refills | Status: AC
  Filled 2021-11-25: qty 7, 7d supply, fill #0

## 2021-11-25 NOTE — Progress Notes (Signed)
Betty Render, DNP, AGNP-c Primary Care & [facility] Medicine [address]  [place], [place] [address] [phone]  Subjective:   Betty Leonard is a 41 y.o. female presents to day for evaluation of: Follow-up (Right ear pain -  seen Dr Tennis Must [place] and he gave antiobotics, now it is worse. She is dizzy and headache. )  Otitis Media Lynnet presents today with vertigo, headache, and ear pain primarily in the right ear. She was seen 10/25 by my associate and treated for otitis media without resolution of symptoms with antibiotic completion. She denies uncontrolled fevers, ear drainage. She does endorse decreased hearing.   PMH, Medications, and Allergies reviewed and updated in chart as appropriate.   ROS negative except for what is listed in HPI. Objective:  BP 126/80   Pulse 100   Ht '5\' 6"'$  (1.676 m)   Wt 230 lb (104.3 kg)   SpO2 100%   BMI 37.12 kg/m  Physical Exam Vitals and nursing note reviewed.  Constitutional:      Appearance: She is ill-appearing.  HENT:     Head: Normocephalic.     Right Ear: Decreased hearing noted. Tenderness present. No drainage or swelling. A middle ear effusion is present. Tympanic membrane is erythematous and bulging.     Left Ear: Hearing normal. No drainage or swelling. Tympanic membrane is bulging.     Nose: Rhinorrhea present.     Mouth/Throat:     Mouth: Mucous membranes are moist.     Pharynx: Oropharynx is clear. No oropharyngeal exudate.  Eyes:     Extraocular Movements: Extraocular movements intact.     Pupils: Pupils are equal, round, and reactive to light.  Cardiovascular:     Rate and Rhythm: Normal rate and regular rhythm.     Pulses: Normal pulses.     Heart sounds: Normal heart sounds.  Pulmonary:     Effort: Pulmonary effort is normal.     Breath sounds: Normal breath sounds. No wheezing.  Lymphadenopathy:     Cervical: Cervical adenopathy present.  Skin:    General: Skin is warm and dry.      Capillary Refill: Capillary refill takes less than 2 seconds.  Neurological:     General: No focal deficit present.     Mental Status: She is alert and oriented to person, place, and time.           Assessment & Plan:   Problem List Items Addressed This Visit     Non-recurrent acute suppurative otitis media of both ears without spontaneous rupture of tympanic membranes    Symptoms and presentation consistent with continued infection in the right ear with no systemic symptoms at this time. TM is erythematous and bulging with visualized infection present. Eustachian tube tenderness is also present as well as lymphadenopathy. Given the ongoing symptoms despite initial antibiotic treatment, we will plan to treat with stronger antibiotics and IM rocephin in the office today to get into her system quickly. If her symptoms do not improve over the next 2-3 days, I recommend that she follow-up for further evaluation. We may need to send referral to ENT if this recurs given the significance of her symptoms at this time. Will follow closely.        Relevant Medications   fluconazole (DIFLUCAN) 150 MG tablet   Other Visit Diagnoses     Recurrent acute suppurative otitis media of right ear without spontaneous rupture of tympanic membrane    -  Primary   Relevant Medications   cefTRIAXone (ROCEPHIN) injection 500 mg (Completed)   dexamethasone (DECADRON) injection 10 mg (Completed)   fluconazole (DIFLUCAN) 150 MG tablet   ketorolac (TORADOL) injection 60 mg (Completed)         Betty Render, DNP, AGNP-c 12/19/2021  8:10 AM    History, Medications, Surgery, SDOH, and Family History reviewed and updated as appropriate.

## 2021-11-25 NOTE — Patient Instructions (Signed)
If you are not feeling better by Thursday, let me know so we can make changes if needed.

## 2021-12-03 ENCOUNTER — Ambulatory Visit (HOSPITAL_BASED_OUTPATIENT_CLINIC_OR_DEPARTMENT_OTHER): Payer: 59 | Admitting: Orthopaedic Surgery

## 2021-12-03 ENCOUNTER — Ambulatory Visit (INDEPENDENT_AMBULATORY_CARE_PROVIDER_SITE_OTHER): Payer: 59 | Admitting: Orthopaedic Surgery

## 2021-12-03 ENCOUNTER — Encounter: Payer: Self-pay | Admitting: Orthopaedic Surgery

## 2021-12-03 ENCOUNTER — Telehealth: Payer: Self-pay | Admitting: Orthopaedic Surgery

## 2021-12-03 ENCOUNTER — Other Ambulatory Visit (HOSPITAL_BASED_OUTPATIENT_CLINIC_OR_DEPARTMENT_OTHER): Payer: Self-pay

## 2021-12-03 DIAGNOSIS — R200 Anesthesia of skin: Secondary | ICD-10-CM | POA: Diagnosis not present

## 2021-12-03 MED ORDER — CELECOXIB 200 MG PO CAPS
200.0000 mg | ORAL_CAPSULE | Freq: Two times a day (BID) | ORAL | 1 refills | Status: DC | PRN
  Filled 2021-12-03: qty 60, 30d supply, fill #0

## 2021-12-03 MED ORDER — GABAPENTIN 300 MG PO CAPS
300.0000 mg | ORAL_CAPSULE | Freq: Every day | ORAL | 1 refills | Status: DC
  Filled 2021-12-03: qty 30, 15d supply, fill #0

## 2021-12-03 NOTE — Progress Notes (Signed)
The patient is a 41 year old left-hand-dominant female who comes in with acute left hand pain and weakness with numbness and tingling that is been going on since Saturday.  She is also losing strength in that hand.  The pain is on the lateral aspect of her hand and he started to hold her fingers in a flexed posture.  She has had a recent illness with an ear infection.  She had been on steroids as well as Levaquin.  She denies any neck pain.  She denies any weakness in her proximal muscle groups of either arm or her lower extremities.  She denies any fever or chills.  She denies any chest pain or shortness of breath.  She has had known injury.  This is getting worse.  It hurts to even use a mouse with her computer.  Examination of her left hand shows her hand is well-perfused.  She can flex and extend her fingers but is painful over her fourth and fifth digits.  There is no loss of perfusion at all.  There is some subjective numbness in the ulnar nerve distribution.  She has mild positive Tinel's exam at the left elbow.  Her neck exam is normal.  She has good pinch and grip strength but it is painful.  She is currently having some type of acute irritation of her nerves.  I would like to try gabapentin 300 mg at night which she can increase to twice a day after 5 days if she is tolerating it well.  I would also like to put her on Celebrex 200 mg twice daily with food.  She can try Voltaren gel around her hand.  I did place a compressive Ace wrap around the hand and this helped her feel better quite a bit.  All question concerns were answered and addressed.  Like to see her back in just a week for repeat exam.  If things worsen she will let us know.

## 2021-12-03 NOTE — Telephone Encounter (Signed)
Patient called advised she is taking Zoloft and was prescribed Celebrex and Gabapentin and needed to know if the medication will have a reaction if taken together? The number to contact patient is [phone]

## 2021-12-04 ENCOUNTER — Other Ambulatory Visit (HOSPITAL_BASED_OUTPATIENT_CLINIC_OR_DEPARTMENT_OTHER): Payer: Self-pay

## 2021-12-04 MED ORDER — EPINEPHRINE 0.3 MG/0.3ML IJ SOAJ: 0.3000 mg | Freq: Once | INTRAMUSCULAR | 2 refills | Status: AC

## 2021-12-04 MED ORDER — IBUPROFEN 800 MG PO TABS
800.0000 mg | ORAL_TABLET | Freq: Three times a day (TID) | ORAL | 1 refills | Status: DC
  Filled 2021-12-29: qty 90, 30d supply, fill #0

## 2021-12-04 NOTE — Telephone Encounter (Signed)
Called and advised pt and she stated understanding

## 2021-12-10 ENCOUNTER — Ambulatory Visit (INDEPENDENT_AMBULATORY_CARE_PROVIDER_SITE_OTHER): Payer: 59 | Admitting: Orthopaedic Surgery

## 2021-12-10 DIAGNOSIS — R200 Anesthesia of skin: Secondary | ICD-10-CM

## 2021-12-10 NOTE — Progress Notes (Signed)
Chief Complaint: Left hand pain and numbness     History of Present Illness:    Betty Leonard is a 41 y.o. female left-hand-dominant female presents today with persistent ongoing numbness in the small finger with occasional shooting up and down the forearm.  She states that this came on quite suddenly 1 week ago.  She has been on gabapentin and Celebrex from Dr. Ninfa Linden which did not help her at all.  She feels like the hand is getting weak and is having a hard time opening jars.  She is experiencing numbness in the small 2 fingers.  This is worse at night.  She works in Press photographer    Surgical History:   None  PMH/PSH/Family History/Social History/Meds/Allergies:    Past Medical History:  Diagnosis Date   Anemia    iron infusion sept 2020   Chicken pox    Depression    Esophagitis 12/24/2011   GERD (gastroesophageal reflux disease)    History of wheezing 06/06/2018   Menorrhagia    with irregular cycles   Migraines    Pneumonia 07/2018   Seasonal allergies    UTI (urinary tract infection) finished antibiotic 04-11-2019   Past Surgical History:  Procedure Laterality Date   ESOPHAGOGASTRODUODENOSCOPY  12/24/2011   Procedure: ESOPHAGOGASTRODUODENOSCOPY (EGD);  Surgeon: Inda Castle, MD;  Location: [facility];  Service: Endoscopy;  Laterality: N/A;   FOOT SURGERY Right 2009   repair remotely after a fracture   FOOT SURGERY Right 1'2020   HYSTEROSCOPY WITH NOVASURE N/A 04/25/2019   Procedure: HYSTEROSCOPY WITH NOVASURE ABLATION;  Surgeon: Megan Salon, MD;  Location: [facility];  Service: Gynecology;  Laterality: N/A;   KNEE ARTHROSCOPY Left 2018   Social History   Socioeconomic History   Marital status: Single    Spouse name: Not on file   Number of children: Not on file   Years of education: Not on file   Highest education level: Not on file  Occupational History   Not on file  Tobacco Use   Smoking status:  Former    Packs/day: 1.00    Years: 5.00    Total pack years: 5.00    Types: Cigarettes    Quit date: 12/29/2018    Years since quitting: 2.9   Smokeless tobacco: Never   Tobacco comments:    stopped on 12/29/2018  Vaping Use   Vaping Use: Never used  Substance and Sexual Activity   Alcohol use: Not Currently    Alcohol/week: 0.0 standard drinks of alcohol   Drug use: No   Sexual activity: Not Currently    Birth control/protection: Abstinence  Other Topics Concern   Not on file  Social History Narrative   Work or Database administrator, [employer]      Home Situation: lives with roomate      Spiritual Beliefs: Christian      Lifestyle: CV exercise (44mnutes) 3 days per week; diet is poor               Social Determinants of Health   Financial Resource Strain: Not on file  Food Insecurity: No Food Insecurity (12/06/2019)   Hunger Vital Sign    Worried About Running Out of Food in the Last Year: Never true    [place] the Last Year:  Never true  Transportation Needs: Not on file  Physical Activity: Not on file  Stress: Not on file  Social Connections: Not on file   Family History  Problem Relation Age of Onset   Leukemia Paternal Grandfather    Lung cancer Paternal Grandfather    Arthritis Paternal Grandfather    Diabetes Paternal Grandfather    Heart disease Paternal Grandfather    Hypertension Paternal Grandfather    Kidney disease Paternal Grandfather    Prostate cancer Paternal Grandfather    Gaucher's disease Paternal 66    Sudden death Father        age 5, nothing found on autopsy   Diabetes Father    Hyperlipidemia Father    Hypertension Father    Mental illness Mother        bipolar, schizophrenia   Alcohol abuse Maternal Grandfather    Stroke Paternal Grandmother    Colon cancer Neg Hx    Allergies  Allergen Reactions   Codeine Shortness Of Breath   Hydrocodone Shortness Of Breath   Iodine Other (See Comments)    Shellfish allergy    Shellfish Allergy Anaphylaxis   Aspirin Hives   Current Outpatient Medications  Medication Sig Dispense Refill   celecoxib (CELEBREX) 200 MG capsule Take 1 capsule (200 mg total) by mouth 2 (two) times daily between meals as needed. 60 capsule 1   fluconazole (DIFLUCAN) 150 MG tablet Take one tablet by mouth at the first sign of symptoms of yeast. If no resolution, repeat dose in 72 hours. 2 tablet 2   gabapentin (NEURONTIN) 300 MG capsule Take 1 capsule (300 mg total) by mouth at bedtime. Can increase to twice daily after 5 days if tolerating well. 30 capsule 1   hydrOXYzine (ATARAX) 50 MG tablet Take 0.5-1 tablets (25-50 mg total) by mouth at bedtime as needed. For anxiety and sleep. 30 tablet 3   ibuprofen (ADVIL) 800 MG tablet Take 1 tablet (800 mg total) by mouth 3 (three) times daily. 90 tablet 1   meclizine (ANTIVERT) 25 MG tablet Take 1 tablet (25 mg total) by mouth 3 (three) times daily as needed for dizziness or nausea. (Patient not taking: Reported on 11/12/2021) 60 tablet 3   sertraline (ZOLOFT) 50 MG tablet Take 1/2 tab by mouth at bedtime for the first 4 days then increase to full tab at bedtime. 30 tablet 3   Vitamin D, Ergocalciferol, (DRISDOL) 1.25 MG (50000 UNIT) CAPS capsule Take 1 capsule (50,000 Units total) by mouth every 7 (seven) days. Take for 12 total doses(weeks) than can transition to 1000 units OTC supplement daily 12 capsule 3   No current facility-administered medications for this visit.   No results found.  Review of Systems:   A ROS was performed including pertinent positives and negatives as documented in the HPI.  Physical Exam :   Constitutional: NAD and appears stated age Neurological: Alert and oriented Psych: Appropriate affect and cooperative There were no vitals taken for this visit.   Comprehensive Musculoskeletal Exam:    Positive Tinel with flexion of the elbow.  Numbness subjectively in the small digit.  There is some flexion of the small  digit is consistent with intrinsic weakness  Imaging:     I personally reviewed and interpreted the radiographs.   Assessment:   41 y.o. female with acute left hand pain and weakness in the ulnar distribution.  At this time I did describe that her presentation could be consistent with a cubital tunnel syndrome  although this is somewhat atypical how rapidly this is progressed.  To that effect I do believe that the neck step would be an EMG nerve conduction test to focalize the site of nerve injury.  I did describe that at that point we may need to further work this up with an MRI for a potentially compressive lesion given the rapid mass with which this is involved.  That being said I would like to start with a nerve conduction EMG test with my partner Dr. Laurence Spates for assessment of the specific site.  Plan :    -Plan for EMG nerve conduction test referral to Dr. Ernestina Patches for left arm cubital versus wrist entrapment of the ulnar nerve     I personally saw and evaluated the patient, and participated in the management and treatment plan.  Vanetta Mulders, MD Attending Physician, Orthopedic Surgery  This document was dictated using Dragon voice recognition software. A reasonable attempt at proof reading has been made to minimize errors.

## 2021-12-15 ENCOUNTER — Ambulatory Visit: Payer: 59 | Admitting: Orthopaedic Surgery

## 2021-12-15 ENCOUNTER — Ambulatory Visit (HOSPITAL_BASED_OUTPATIENT_CLINIC_OR_DEPARTMENT_OTHER): Payer: 59 | Admitting: Orthopaedic Surgery

## 2021-12-17 ENCOUNTER — Encounter (HOSPITAL_BASED_OUTPATIENT_CLINIC_OR_DEPARTMENT_OTHER): Payer: Self-pay

## 2021-12-18 ENCOUNTER — Other Ambulatory Visit: Payer: Self-pay

## 2021-12-18 ENCOUNTER — Emergency Department (HOSPITAL_BASED_OUTPATIENT_CLINIC_OR_DEPARTMENT_OTHER): Payer: 59 | Admitting: Radiology

## 2021-12-18 ENCOUNTER — Encounter (HOSPITAL_BASED_OUTPATIENT_CLINIC_OR_DEPARTMENT_OTHER): Payer: Self-pay

## 2021-12-18 DIAGNOSIS — G5622 Lesion of ulnar nerve, left upper limb: Secondary | ICD-10-CM | POA: Diagnosis not present

## 2021-12-18 DIAGNOSIS — M79642 Pain in left hand: Secondary | ICD-10-CM | POA: Diagnosis present

## 2021-12-18 NOTE — ED Triage Notes (Signed)
POV, left hand pain after opening bag, seeing ortho MD for ongoing numbness and pain in same hand. Sts that tonight she heard a "snapping noise like a rubber band", pain is radiating from forearm to hand. Amb, a&O x 4.

## 2021-12-19 ENCOUNTER — Ambulatory Visit (INDEPENDENT_AMBULATORY_CARE_PROVIDER_SITE_OTHER): Payer: 59 | Admitting: Orthopaedic Surgery

## 2021-12-19 ENCOUNTER — Emergency Department (HOSPITAL_BASED_OUTPATIENT_CLINIC_OR_DEPARTMENT_OTHER)
Admission: EM | Admit: 2021-12-19 | Discharge: 2021-12-19 | Disposition: A | Discharge status: Home / Self Care | Payer: 59 | Attending: Emergency Medicine | Admitting: Emergency Medicine

## 2021-12-19 ENCOUNTER — Encounter (HOSPITAL_BASED_OUTPATIENT_CLINIC_OR_DEPARTMENT_OTHER): Payer: Self-pay | Admitting: Nurse Practitioner

## 2021-12-19 DIAGNOSIS — M778 Other enthesopathies, not elsewhere classified: Secondary | ICD-10-CM

## 2021-12-19 DIAGNOSIS — G5622 Lesion of ulnar nerve, left upper limb: Secondary | ICD-10-CM

## 2021-12-19 DIAGNOSIS — R200 Anesthesia of skin: Secondary | ICD-10-CM

## 2021-12-19 NOTE — Assessment & Plan Note (Signed)
Symptoms and presentation consistent with continued infection in the right ear with no systemic symptoms at this time. TM is erythematous and bulging with visualized infection present. Eustachian tube tenderness is also present as well as lymphadenopathy. Given the ongoing symptoms despite initial antibiotic treatment, we will plan to treat with stronger antibiotics and IM rocephin in the office today to get into her system quickly. If her symptoms do not improve over the next 2-3 days, I recommend that she follow-up for further evaluation. We may need to send referral to ENT if this recurs given the significance of her symptoms at this time. Will follow closely.

## 2021-12-19 NOTE — Progress Notes (Signed)
Chief Complaint: Left hand pain and numbness     History of Present Illness:   12/19/2021: Presents prior to her EMG nerve conduction test as she was opening a bag of chips and felt a snap about the ECU of her left wrist.  Since that time she has pain over the ulnar side of the wrist.  She is pending EMG nerve conduction test with Dr. Ernestina Patches.  Betty Leonard is a 41 y.o. female left-hand-dominant female presents today with persistent ongoing numbness in the small finger with occasional shooting up and down the forearm.  She states that this came on quite suddenly 1 week ago.  She has been on gabapentin and Celebrex from Dr. Ninfa Linden which did not help her at all.  She feels like the hand is getting weak and is having a hard time opening jars.  She is experiencing numbness in the small 2 fingers.  This is worse at night.  She works in Press photographer    Surgical History:   None  PMH/PSH/Family History/Social History/Meds/Allergies:    Past Medical History:  Diagnosis Date  . Anemia    iron infusion sept 2020  . Chicken pox   . Depression   . Esophagitis 12/24/2011  . GERD (gastroesophageal reflux disease)   . History of wheezing 06/06/2018  . Menorrhagia    with irregular cycles  . Migraines   . Pneumonia 07/2018  . Seasonal allergies   . Sinusitis 11/12/2021  . UTI (urinary tract infection) finished antibiotic 04-11-2019   Past Surgical History:  Procedure Laterality Date  . ESOPHAGOGASTRODUODENOSCOPY  12/24/2011   Procedure: ESOPHAGOGASTRODUODENOSCOPY (EGD);  Surgeon: Inda Castle, MD;  Location: [facility];  Service: Endoscopy;  Laterality: N/A;  . FOOT SURGERY Right 2009   repair remotely after a fracture  . FOOT SURGERY Right 1'2020  . HYSTEROSCOPY WITH NOVASURE N/A 04/25/2019   Procedure: HYSTEROSCOPY WITH NOVASURE ABLATION;  Surgeon: Megan Salon, MD;  Location: [facility];  Service: Gynecology;  Laterality: N/A;  .  KNEE ARTHROSCOPY Left 2018   Social History   Socioeconomic History  . Marital status: Single    Spouse name: Not on file  . Number of children: Not on file  . Years of education: Not on file  . Highest education level: Not on file  Occupational History  . Not on file  Tobacco Use  . Smoking status: Former    Packs/day: 1.00    Years: 5.00    Total pack years: 5.00    Types: Cigarettes    Quit date: 12/29/2018    Years since quitting: 2.9  . Smokeless tobacco: Never  . Tobacco comments:    stopped on 12/29/2018  Vaping Use  . Vaping Use: Never used  Substance and Sexual Activity  . Alcohol use: Not Currently    Alcohol/week: 0.0 standard drinks of alcohol  . Drug use: No  . Sexual activity: Not Currently    Birth control/protection: Abstinence  Other Topics Concern  . Not on file  Social History Narrative   Work or Database administrator, [employer]      Home Situation: lives with roomate      Spiritual Beliefs: Christian      Lifestyle: CV exercise (58mnutes) 3 days per week; diet is poor  Social Determinants of Health   Financial Resource Strain: Not on file  Food Insecurity: No Food Insecurity (12/06/2019)   Hunger Vital Sign   . Worried About Charity fundraiser in the Last Year: Never true   . Ran Out of Food in the Last Year: Never true  Transportation Needs: Not on file  Physical Activity: Not on file  Stress: Not on file  Social Connections: Not on file   Family History  Problem Relation Age of Onset  . Leukemia Paternal Grandfather   . Lung cancer Paternal Grandfather   . Arthritis Paternal Grandfather   . Diabetes Paternal Grandfather   . Heart disease Paternal Grandfather   . Hypertension Paternal Grandfather   . Kidney disease Paternal Grandfather   . Prostate cancer Paternal Grandfather   . Gaucher's disease Paternal Grandfather   . Sudden death Father        age 36, nothing found on autopsy  . Diabetes Father   . Hyperlipidemia  Father   . Hypertension Father   . Mental illness Mother        bipolar, schizophrenia  . Alcohol abuse Maternal Grandfather   . Stroke Paternal Grandmother   . Colon cancer Neg Hx    Allergies  Allergen Reactions  . Codeine Shortness Of Breath  . Hydrocodone Shortness Of Breath  . Iodine Other (See Comments)    Shellfish allergy  . Shellfish Allergy Anaphylaxis  . Aspirin Hives   Current Outpatient Medications  Medication Sig Dispense Refill  . celecoxib (CELEBREX) 200 MG capsule Take 1 capsule (200 mg total) by mouth 2 (two) times daily between meals as needed. 60 capsule 1  . fluconazole (DIFLUCAN) 150 MG tablet Take one tablet by mouth at the first sign of symptoms of yeast. If no resolution, repeat dose in 72 hours. 2 tablet 2  . gabapentin (NEURONTIN) 300 MG capsule Take 1 capsule (300 mg total) by mouth at bedtime. Can increase to twice daily after 5 days if tolerating well. 30 capsule 1  . hydrOXYzine (ATARAX) 50 MG tablet Take 0.5-1 tablets (25-50 mg total) by mouth at bedtime as needed. For anxiety and sleep. 30 tablet 3  . ibuprofen (ADVIL) 800 MG tablet Take 1 tablet (800 mg total) by mouth 3 (three) times daily. 90 tablet 1  . sertraline (ZOLOFT) 50 MG tablet Take 1/2 tab by mouth at bedtime for the first 4 days then increase to full tab at bedtime. 30 tablet 3  . Vitamin D, Ergocalciferol, (DRISDOL) 1.25 MG (50000 UNIT) CAPS capsule Take 1 capsule (50,000 Units total) by mouth every 7 (seven) days. Take for 12 total doses(weeks) than can transition to 1000 units OTC supplement daily 12 capsule 3   No current facility-administered medications for this visit.   DG Hand Complete Left  Result Date: 12/19/2021 CLINICAL DATA:  Left hand pain EXAM: LEFT HAND - COMPLETE 3+ VIEW COMPARISON:  Radiographs 01/16/2015 FINDINGS: There is no evidence of fracture or dislocation. There is no evidence of arthropathy or other focal bone abnormality. Soft tissues are unremarkable.  IMPRESSION: Negative. Electronically Signed   By: Placido Sou M.D.   On: 12/19/2021 00:12    Review of Systems:   A ROS was performed including pertinent positives and negatives as documented in the HPI.  Physical Exam :   Constitutional: NAD and appears stated age Neurological: Alert and oriented Psych: Appropriate affect and cooperative There were no vitals taken for this visit.   Comprehensive Musculoskeletal Exam:  Positive Tinel with flexion of the elbow.  Numbness subjectively in the small digit.  There is some flexion of the small digit is consistent with intrinsic weakness  Significant tenderness to palpation about the ECU tendon of the left wrist.  Imaging:     I personally reviewed and interpreted the radiographs.   Assessment:   41 y.o. female with acute left hand pain and weakness in the ulnar distribution.  At this time I did describe that her presentation could be consistent with a cubital tunnel syndrome although this is somewhat atypical how rapidly this is progressed.  To that effect I do believe that the neck step would be an EMG nerve conduction test to focalize the site of nerve injury.  I did describe that at that point we may need to further work this up with an MRI for a potentially compressive lesion given the rapid mass with which this is involved.  That being said I would like to start with a nerve conduction EMG test with my partner Dr. Laurence Spates for assessment of the specific site.  At today's visit I do believe specifically that she does have some ECU irritation after feeling a snap while opening a bag of chips.  To this effect I have recommended ultrasound-guided injection of the ECU.  She will follow-up as planned with Dr. Ernestina Patches for her EMG/nerve conduction  Plan :    -Plan for EMG nerve conduction test referral to Dr. Ernestina Patches for left arm cubital versus wrist entrapment of the ulnar nerve -Left wrist ultrasound-guided injection performed after  verbal consent obtained    Procedure Note  Patient: Betty Leonard             Date of Birth: 1980-06-28           MRN: [id number]             Visit Date: 12/19/2021  Procedures: Visit Diagnoses: No diagnosis found.  Small Joint Inj on 12/19/2021 1:57 PM Details: ulnar approach      I personally saw and evaluated the patient, and participated in the management and treatment plan.  Vanetta Mulders, MD Attending Physician, Orthopedic Surgery  This document was dictated using Dragon voice recognition software. A reasonable attempt at proof reading has been made to minimize errors.

## 2021-12-19 NOTE — ED Provider Notes (Signed)
[facility]  Provider Note  CSN: [id number] Arrival date & time: 12/18/21 1949  History Chief Complaint  Patient presents with   Hand Pain    Betty Leonard is a 41 y.o. female, left handed, reports several weeks of L hand pain/numbness, has been seeing Ortho and is scheduled for an EMG next week. She reports tonight, she was opening a bag and felt a pop along the ulnar side of her hand with increase pain. Her pain is worse with movement of the wrist as well as 4th and 5th fingers. Radiates up to her forearm.    Home Medications Prior to Admission medications   Medication Sig Start Date End Date Taking? Authorizing Provider  celecoxib (CELEBREX) 200 MG capsule Take 1 capsule (200 mg total) by mouth 2 (two) times daily between meals as needed. 12/03/21   Mcarthur Rossetti, MD  fluconazole (DIFLUCAN) 150 MG tablet Take one tablet by mouth at the first sign of symptoms of yeast. If no resolution, repeat dose in 72 hours. 11/25/21   Orma Render, NP  gabapentin (NEURONTIN) 300 MG capsule Take 1 capsule (300 mg total) by mouth at bedtime. Can increase to twice daily after 5 days if tolerating well. 12/03/21   Mcarthur Rossetti, MD  hydrOXYzine (ATARAX) 50 MG tablet Take 0.5-1 tablets (25-50 mg total) by mouth at bedtime as needed. For anxiety and sleep. 10/31/21   Orma Render, NP  ibuprofen (ADVIL) 800 MG tablet Take 1 tablet (800 mg total) by mouth 3 (three) times daily. 03/03/21   Edrick Kins, DPM  meclizine (ANTIVERT) 25 MG tablet Take 1 tablet (25 mg total) by mouth 3 (three) times daily as needed for dizziness or nausea. Patient not taking: Reported on 11/12/2021 09/01/21   Early, Coralee Pesa, NP  sertraline (ZOLOFT) 50 MG tablet Take 1/2 tab by mouth at bedtime for the first 4 days then increase to full tab at bedtime. 10/31/21   Orma Render, NP  Vitamin D, Ergocalciferol, (DRISDOL) 1.25 MG (50000 UNIT) CAPS capsule Take 1 capsule (50,000 Units  total) by mouth every 7 (seven) days. Take for 12 total doses(weeks) than can transition to 1000 units OTC supplement daily 09/01/21   Early, Coralee Pesa, NP     Allergies    Codeine, Hydrocodone, Iodine, Shellfish allergy, and Aspirin   Review of Systems   Review of Systems Please see HPI for pertinent positives and negatives  Physical Exam BP (!) 128/91   Pulse 82   Temp 98.1 F (36.7 C) (Oral)   Resp 18   Ht '5\' 6"'$  (1.676 m)   Wt 97.5 kg   SpO2 100%   BMI 34.70 kg/m   Physical Exam Vitals and nursing note reviewed.  HENT:     Head: Normocephalic.     Nose: Nose normal.  Eyes:     Extraocular Movements: Extraocular movements intact.  Pulmonary:     Effort: Pulmonary effort is normal.  Musculoskeletal:        General: Tenderness (L ulnar hand; there is some tenderness with radicular pain with palpation of her ulnar nerve at the elbow) present. No swelling or signs of injury. Normal range of motion.     Cervical back: Neck supple.  Skin:    Findings: No rash (on exposed skin).  Neurological:     Mental Status: She is alert and oriented to person, place, and time.  Psychiatric:        Mood and  Affect: Mood normal.     ED Results / Procedures / Treatments   EKG None  Procedures Procedures  Medications Ordered in the ED Medications - No data to display  Initial Impression and Plan  Patient here with exacerbation of recent suspected ulnar nerve neuropathy. Scheduled for EMG next week. I personally viewed the images from radiology studies and agree with radiologist interpretation: Xrays today are negative. Patient requesting a wrist brace for comfort. Advised to continue with her outpatient management per Ortho.   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 Decision-making details documented in ED Course.    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

## 2021-12-25 NOTE — Addendum Note (Signed)
Addended by: Donnajean Chesnut, Clarise Cruz E on: 12/25/2021 07:48 PM   Modules accepted: Orders

## 2021-12-26 ENCOUNTER — Telehealth (HOSPITAL_BASED_OUTPATIENT_CLINIC_OR_DEPARTMENT_OTHER): Payer: Self-pay | Admitting: Family Medicine

## 2021-12-26 ENCOUNTER — Ambulatory Visit (INDEPENDENT_AMBULATORY_CARE_PROVIDER_SITE_OTHER): Payer: 59 | Admitting: Physical Medicine and Rehabilitation

## 2021-12-26 DIAGNOSIS — R202 Paresthesia of skin: Secondary | ICD-10-CM

## 2021-12-26 DIAGNOSIS — R29898 Other symptoms and signs involving the musculoskeletal system: Secondary | ICD-10-CM

## 2021-12-26 NOTE — Progress Notes (Unsigned)
Numeric Pain Rating Scale and Functional Assessment Average Pain 1   In the last MONTH (on 0-10 scale) has pain interfered with the following?  1. General activity like being  able to carry out your everyday physical activities such as walking, climbing stairs, carrying groceries, or moving a chair?  Rating( varies )   Left handed. Left wrist pain and weakness. Pain can shoot to the elbow

## 2021-12-26 NOTE — Telephone Encounter (Signed)
PT is calling to see if she can get a test completed here at [facility], that Emeterio Reeve Early ordered last night.   We should only be allowing patients of [facility] to have labs completed here.  So, I asked the pt was she staying here, she advised Yes, so I advised that I would ask Dr Burnard Bunting to order the test, since he was the PCP.  Laretta Bolster should have consulted with Dr Burnard Bunting, as he is the PCP)   See Patient Msg under 11-17-21 (testing) Some of the note I did manage to find the testing recommended for Gaucher disease. There are two tests. The first is an enzyme test that can be done with a blood draw. It looks to see if you have low enzyme activity. If there is no evidence of low enzyme activity this can rule out the condition. If the enzyme activity is low or borderline, it is recommended that you continue on to a genetic test to look at the specific gene's affected.     I will need to call the pt back if you will order the testing  --Please Advise

## 2021-12-29 NOTE — Procedures (Unsigned)
EMG & NCV Findings: All nerve conduction studies (as indicated in the following tables) were within normal limits.    All examined muscles (as indicated in the following table) showed no evidence of electrical instability.    Impression: Essentially NORMAL electrodiagnostic study of the left upper limb.  There is no significant electrodiagnostic evidence of nerve entrapment, brachial plexopathy or cervical radiculopathy.  As you know, purely sensory or demyelinating radiculopathies and chemical radiculitis may not be detected with this particular electrodiagnostic study.  Recommendations: 1.  Follow-up with referring physician. 2.  Continue current management of symptoms.  ___________________________ Laurence Spates FAAPMR Board Certified, American Board of Physical Medicine and Rehabilitation    Nerve Conduction Studies Anti Sensory Summary Table   Stim Site NR Peak (ms) Norm Peak (ms) P-T Amp (V) Norm P-T Amp Site1 Site2 Delta-P (ms) Dist (cm) Vel (m/s) Norm Vel (m/s)  Left Median Acr Palm Anti Sensory (2nd Digit)  31.9C  Wrist    2.7 <3.6 43.3 >10 Wrist Palm 1.2 0.0    Palm    1.5 <2.0 52.3         Left Radial Anti Sensory (Base 1st Digit)  31.9C  Wrist    1.9 <3.1 47.6  Wrist Base 1st Digit 1.9 0.0    Left Ulnar Anti Sensory (5th Digit)  32.2C  Wrist    2.8 <3.7 36.8 >15.0 Wrist 5th Digit 2.8 14.0 50 >38   Motor Summary Table   Stim Site NR Onset (ms) Norm Onset (ms) O-P Amp (mV) Norm O-P Amp Site1 Site2 Delta-0 (ms) Dist (cm) Vel (m/s) Norm Vel (m/s)  Left Median Motor (Abd Poll Brev)  32C  Wrist    2.8 <4.2 8.2 >5 Elbow Wrist 3.3 19.5 59 >50  Elbow    6.1  8.2         Left Ulnar Motor (Abd Dig Min)  32.2C  Wrist    2.4 <4.2 8.3 >3 B Elbow Wrist 2.7 18.5 69 >53  B Elbow    5.1  8.0  A Elbow B Elbow 1.5 11.0 73 >53  A Elbow    6.6  5.4          EMG   Side Muscle Nerve Root Ins Act Fibs Psw Amp Dur Poly Recrt Int Fraser Din Comment  Left Abd Poll Brev Median C8-T1 Nml Nml  Nml Nml Nml 0 Nml Nml   Left 1stDorInt Ulnar C8-T1 Nml Nml Nml Nml Nml 0 Nml Nml   Left PronatorTeres Median C6-7 Nml Nml Nml Nml Nml 0 Nml Nml   Left Biceps Musculocut C5-6 Nml Nml Nml Nml Nml 0 Nml Nml   Left Deltoid Axillary C5-6 Nml Nml Nml Nml Nml 0 Nml Nml     Nerve Conduction Studies Anti Sensory Left/Right Comparison   Stim Site L Lat (ms) R Lat (ms) L-R Lat (ms) L Amp (V) R Amp (V) L-R Amp (%) Site1 Site2 L Vel (m/s) R Vel (m/s) L-R Vel (m/s)  Median Acr Palm Anti Sensory (2nd Digit)  31.9C  Wrist 2.7   43.3   Wrist Palm     Palm 1.5   52.3         Radial Anti Sensory (Base 1st Digit)  31.9C  Wrist 1.9   47.6   Wrist Base 1st Digit     Ulnar Anti Sensory (5th Digit)  32.2C  Wrist 2.8   36.8   Wrist 5th Digit 50     Motor Left/Right Comparison   Stim  Site L Lat (ms) R Lat (ms) L-R Lat (ms) L Amp (mV) R Amp (mV) L-R Amp (%) Site1 Site2 L Vel (m/s) R Vel (m/s) L-R Vel (m/s)  Median Motor (Abd Poll Brev)  32C  Wrist 2.8   8.2   Elbow Wrist 59    Elbow 6.1   8.2         Ulnar Motor (Abd Dig Min)  32.2C  Wrist 2.4   8.3   B Elbow Wrist 69    B Elbow 5.1   8.0   A Elbow B Elbow 73    A Elbow 6.6   5.4            Waveforms:

## 2021-12-30 ENCOUNTER — Other Ambulatory Visit (HOSPITAL_BASED_OUTPATIENT_CLINIC_OR_DEPARTMENT_OTHER): Payer: Self-pay

## 2022-01-01 NOTE — Progress Notes (Signed)
JAMA MCMILLER - 41 y.o. female MRN [id number]  Date of birth: 1980-10-10  Office Visit Note: Visit Date: 12/26/2021 PCP: No primary care provider on file. Referred by: Vanetta Mulders, MD  Subjective: Chief Complaint  Patient presents with   Left Wrist - Pain, Weakness   HPI: Betty Leonard is a 41 y.o. female who comes in today at the request of Dr. Vanetta Mulders for evaluation and management of the Left upper extremities.  Patient is Left hand dominant.  She reports acute onset of left hand tingling numbness in the fifth digit as well as weakness in the hand.  This occurred in the early part of November.  No specific injury.  No frank radicular type pain down the arm as she gets most of her symptoms radiating from the hand up.  She initially saw Dr. Jean Rosenthal in our office who felt like she was having some type of nerve irritation at the time.  He did prescribe Celebrex and gabapentin which did not seem to help very much.  Subsequently she was seen by Dr. Vanetta Mulders who felt like this may be an ulnar neuropathy and she had continued numbness and tingling.  After this she actually ended up at the emergency room to the amount of pain she was having and he did see her back and felt like she was having some irritation of the ECU after trying to open a bag of chips.  She denies any right-sided symptoms.  She has no medical history of neuropathy.  Denies any frank neck pain but has had some issues in the past.  No prior surgeries.  No prior electrodiagnostic study.  Does feel like her symptoms are worsening.  Pain level is not significant at the moment today but she does have issues with functional living ability.    Review of Systems  Musculoskeletal:  Positive for joint pain.  Neurological:  Positive for tingling and focal weakness.  All other systems reviewed and are negative.  Otherwise per HPI.  Assessment & Plan: Visit Diagnoses:    ICD-10-CM   1. Paresthesia of skin   R20.2 NCV with EMG (electromyography)    2. Left hand weakness  R29.898        Plan: Impression: Clinically this does present as an ulnar neuropathy although there is surroundings of when it started would seem to be problematic in terms of pathology of the ulnar nerve.  She did have upper respiratory ear infection prior to this and you could entertain the idea of a atypical Parsonage-Turner syndrome but no evidence of that on exam.  Clinical exam seems to fit more with tingling numbness just around the fifth digit where as you would expect the ulnar nerve to affect the fourth digit as well.  She does exhibit some type of flexion of the ulnar digits and somewhat of a positive Benedictine sign.  Electrodiagnostic study today essentially NORMAL electrodiagnostic study of the left upper limb.  There is no significant electrodiagnostic evidence of nerve entrapment, brachial plexopathy or cervical radiculopathy.    As you know, purely sensory or demyelinating radiculopathies and chemical radiculitis may not be detected with this particular electrodiagnostic study.  Recommendations: 1.  Follow-up with referring physician. 2.  Continue current management of symptoms.  Physical or occupational therapy.  Repeat studies in 2 months if symptoms continue or worsen.  Consider cervical MRI.  Meds & Orders: No orders of the defined types were placed in this encounter.   Orders Placed  This Encounter  Procedures   NCV with EMG (electromyography)    Follow-up: Return for Vanetta Mulders, MD.   Procedures: No procedures performed  EMG & NCV Findings: All nerve conduction studies (as indicated in the following tables) were within normal limits.    All examined muscles (as indicated in the following table) showed no evidence of electrical instability.    Impression: Essentially NORMAL electrodiagnostic study of the left upper limb.  There is no significant electrodiagnostic evidence of nerve entrapment,  brachial plexopathy or cervical radiculopathy.    As you know, purely sensory or demyelinating radiculopathies and chemical radiculitis may not be detected with this particular electrodiagnostic study.  Recommendations: 1.  Follow-up with referring physician. 2.  Continue current management of symptoms.  Repeat studies in 2 months if symptoms continue or worsen.  Consider cervical MRI.  ___________________________ Laurence Spates FAAPMR Board Certified, American Board of Physical Medicine and Rehabilitation    Nerve Conduction Studies Anti Sensory Summary Table   Stim Site NR Peak (ms) Norm Peak (ms) P-T Amp (V) Norm P-T Amp Site1 Site2 Delta-P (ms) Dist (cm) Vel (m/s) Norm Vel (m/s)  Left Median Acr Palm Anti Sensory (2nd Digit)  31.9C  Wrist    2.7 <3.6 43.3 >10 Wrist Palm 1.2 0.0    Palm    1.5 <2.0 52.3         Left Radial Anti Sensory (Base 1st Digit)  31.9C  Wrist    1.9 <3.1 47.6  Wrist Base 1st Digit 1.9 0.0    Left Ulnar Anti Sensory (5th Digit)  32.2C  Wrist    2.8 <3.7 36.8 >15.0 Wrist 5th Digit 2.8 14.0 50 >38   Motor Summary Table   Stim Site NR Onset (ms) Norm Onset (ms) O-P Amp (mV) Norm O-P Amp Site1 Site2 Delta-0 (ms) Dist (cm) Vel (m/s) Norm Vel (m/s)  Left Median Motor (Abd Poll Brev)  32C  Wrist    2.8 <4.2 8.2 >5 Elbow Wrist 3.3 19.5 59 >50  Elbow    6.1  8.2         Left Ulnar Motor (Abd Dig Min)  32.2C  Wrist    2.4 <4.2 8.3 >3 B Elbow Wrist 2.7 18.5 69 >53  B Elbow    5.1  8.0  A Elbow B Elbow 1.5 11.0 73 >53  A Elbow    6.6  5.4          EMG   Side Muscle Nerve Root Ins Act Fibs Psw Amp Dur Poly Recrt Int Fraser Din Comment  Left Abd Poll Brev Median C8-T1 Nml Nml Nml Nml Nml 0 Nml Nml   Left 1stDorInt Ulnar C8-T1 Nml Nml Nml Nml Nml 0 Nml Nml   Left PronatorTeres Median C6-7 Nml Nml Nml Nml Nml 0 Nml Nml   Left Biceps Musculocut C5-6 Nml Nml Nml Nml Nml 0 Nml Nml   Left Deltoid Axillary C5-6 Nml Nml Nml Nml Nml 0 Nml Nml     Nerve Conduction  Studies Anti Sensory Left/Right Comparison   Stim Site L Lat (ms) R Lat (ms) L-R Lat (ms) L Amp (V) R Amp (V) L-R Amp (%) Site1 Site2 L Vel (m/s) R Vel (m/s) L-R Vel (m/s)  Median Acr Palm Anti Sensory (2nd Digit)  31.9C  Wrist 2.7   43.3   Wrist Palm     Palm 1.5   52.3         Radial Anti Sensory (Base 1st Digit)  31.[place]  Wrist 1.9   47.6   Wrist Base 1st Digit     Ulnar Anti Sensory (5th Digit)  32.2C  Wrist 2.8   36.8   Wrist 5th Digit 50     Motor Left/Right Comparison   Stim Site L Lat (ms) R Lat (ms) L-R Lat (ms) L Amp (mV) R Amp (mV) L-R Amp (%) Site1 Site2 L Vel (m/s) R Vel (m/s) L-R Vel (m/s)  Median Motor (Abd Poll Brev)  32C  Wrist 2.8   8.2   Elbow Wrist 59    Elbow 6.1   8.2         Ulnar Motor (Abd Dig Min)  32.2C  Wrist 2.4   8.3   B Elbow Wrist 69    B Elbow 5.1   8.0   A Elbow B Elbow 73    A Elbow 6.6   5.4            Waveforms:             Clinical History: No specialty comments available.   She reports that she quit smoking about 3 years ago. Her smoking use included cigarettes. She has a 5.00 pack-year smoking history. She has never used smokeless tobacco. No results for input(s): "HGBA1C", "LABURIC" in the last 8760 hours.  Objective:  VS:  HT:    WT:   BMI:     BP:   HR: bpm  TEMP: ( )  RESP:  Physical Exam Vitals and nursing note reviewed.  Constitutional:      General: She is not in acute distress.    Appearance: Normal appearance. She is well-developed. She is not ill-appearing.  HENT:     Head: Normocephalic and atraumatic.  Eyes:     Conjunctiva/sclera: Conjunctivae normal.     Pupils: Pupils are equal, round, and reactive to light.  Cardiovascular:     Rate and Rhythm: Normal rate.     Pulses: Normal pulses.  Pulmonary:     Effort: Pulmonary effort is normal.  Musculoskeletal:        General: No swelling or deformity.     Cervical back: Normal range of motion and neck supple. Tenderness present.     Right lower leg: No  edema.     Left lower leg: No edema.     Comments: Inspection reveals no atrophy of the bilateral APB or FDI or hand intrinsics. There is no swelling, color changes, allodynia or dystrophic changes. There is 5 out of 5 strength in the bilateral wrist extension, finger abduction and long finger flexion. There is intact sensation to light touch in all dermatomal and peripheral nerve distributions. There is a negative Froment's test bilaterally. There is a negative Tinel's test at the bilateral wrist and elbow. There is a negative Phalen's test bilaterally. There is a negative Hoffmann's test bilaterally.  Skin:    General: Skin is warm and dry.     Findings: No erythema or rash.  Neurological:     General: No focal deficit present.     Mental Status: She is alert and oriented to person, place, and time.     Cranial Nerves: No cranial nerve deficit.     Sensory: Sensory deficit present.     Motor: Weakness present. No abnormal muscle tone.     Coordination: Coordination normal.     Gait: Gait normal.  Psychiatric:        Mood and Affect: Mood normal.  Behavior: Behavior normal.     Ortho Exam  Imaging: No results found.  Past Medical/Family/Surgical/Social History: Medications & Allergies reviewed per EMR, new medications updated. Patient Active Problem List   Diagnosis Date Noted   Vertigo 10/05/2021   COVID-19 08/21/2021   Non-recurrent acute suppurative otitis media of both ears without spontaneous rupture of tympanic membranes 08/05/2021   Elevated ferritin 08/14/2020   Obesity 12/06/2019   Iron deficiency anemia due to chronic blood loss 10/07/2018   Adverse food reaction 06/06/2018   Other allergic rhinitis 06/06/2018   Hair loss 06/06/2018   History of frequent URI 06/06/2018   Anxiety and depression 09/23/2012   Hx of migraine headaches 09/23/2012   Nausea with vomiting, chronic - followed by Dr. Deatra Ina and GI 12/16/2011   Past Medical History:  Diagnosis Date    Anemia    iron infusion sept 2020   Chicken pox    Depression    Esophagitis 12/24/2011   GERD (gastroesophageal reflux disease)    History of wheezing 06/06/2018   Menorrhagia    with irregular cycles   Migraines    Pneumonia 07/2018   Seasonal allergies    Sinusitis 11/12/2021   UTI (urinary tract infection) finished antibiotic 04-11-2019   Family History  Problem Relation Age of Onset   Leukemia Paternal Grandfather    Lung cancer Paternal Grandfather    Arthritis Paternal Grandfather    Diabetes Paternal Grandfather    Heart disease Paternal Grandfather    Hypertension Paternal Grandfather    Kidney disease Paternal Grandfather    Prostate cancer Paternal Grandfather    Gaucher's disease Paternal 28    Sudden death Father        age 51, nothing found on autopsy   Diabetes Father    Hyperlipidemia Father    Hypertension Father    Mental illness Mother        bipolar, schizophrenia   Alcohol abuse Maternal Grandfather    Stroke Paternal Grandmother    Colon cancer Neg Hx    Past Surgical History:  Procedure Laterality Date   ESOPHAGOGASTRODUODENOSCOPY  12/24/2011   Procedure: ESOPHAGOGASTRODUODENOSCOPY (EGD);  Surgeon: Inda Castle, MD;  Location: [facility];  Service: Endoscopy;  Laterality: N/A;   FOOT SURGERY Right 2009   repair remotely after a fracture   FOOT SURGERY Right 1'2020   HYSTEROSCOPY WITH NOVASURE N/A 04/25/2019   Procedure: HYSTEROSCOPY WITH NOVASURE ABLATION;  Surgeon: Megan Salon, MD;  Location: [facility];  Service: Gynecology;  Laterality: N/A;   KNEE ARTHROSCOPY Left 2018   Social History   Occupational History   Not on file  Tobacco Use   Smoking status: Former    Packs/day: 1.00    Years: 5.00    Total pack years: 5.00    Types: Cigarettes    Quit date: 12/29/2018    Years since quitting: 3.0   Smokeless tobacco: Never   Tobacco comments:    stopped on 12/29/2018  Vaping Use   Vaping Use: Never used   Substance and Sexual Activity   Alcohol use: Not Currently    Alcohol/week: 0.0 standard drinks of alcohol   Drug use: No   Sexual activity: Not Currently    Birth control/protection: Abstinence

## 2022-01-02 ENCOUNTER — Ambulatory Visit (INDEPENDENT_AMBULATORY_CARE_PROVIDER_SITE_OTHER): Payer: 59 | Admitting: Orthopaedic Surgery

## 2022-01-02 ENCOUNTER — Other Ambulatory Visit: Payer: 59

## 2022-01-02 DIAGNOSIS — G540 Brachial plexus disorders: Secondary | ICD-10-CM

## 2022-01-02 DIAGNOSIS — Z8349 Family history of other endocrine, nutritional and metabolic diseases: Secondary | ICD-10-CM

## 2022-01-02 NOTE — Progress Notes (Signed)
Chief Complaint: Left hand pain and numbness     History of Present Illness:   01/02/2022: Presents today for follow-up of her EMG/nerve conduction test of her left hand.  At this time this examination was normal.  She states that she has begun to experience pain in the clavicle and is now experiencing numbness with overhead activity.  She is here today for further assessment.   Betty Leonard is a 41 y.o. female left-hand-dominant female presents today with persistent ongoing numbness in the small finger with occasional shooting up and down the forearm.  She states that this came on quite suddenly 1 week ago.  She has been on gabapentin and Celebrex from Dr. Ninfa Linden which did not help her at all.  She feels like the hand is getting weak and is having a hard time opening jars.  She is experiencing numbness in the small 2 fingers.  This is worse at night.  She works in Press photographer    Surgical History:   None  PMH/PSH/Family History/Social History/Meds/Allergies:    Past Medical History:  Diagnosis Date   Anemia    iron infusion sept 2020   Chicken pox    Depression    Esophagitis 12/24/2011   GERD (gastroesophageal reflux disease)    History of wheezing 06/06/2018   Menorrhagia    with irregular cycles   Migraines    Pneumonia 07/2018   Seasonal allergies    Sinusitis 11/12/2021   UTI (urinary tract infection) finished antibiotic 04-11-2019   Past Surgical History:  Procedure Laterality Date   ESOPHAGOGASTRODUODENOSCOPY  12/24/2011   Procedure: ESOPHAGOGASTRODUODENOSCOPY (EGD);  Surgeon: Inda Castle, MD;  Location: [facility];  Service: Endoscopy;  Laterality: N/A;   FOOT SURGERY Right 2009   repair remotely after a fracture   FOOT SURGERY Right 1'2020   HYSTEROSCOPY WITH NOVASURE N/A 04/25/2019   Procedure: HYSTEROSCOPY WITH NOVASURE ABLATION;  Surgeon: Megan Salon, MD;  Location[facility];  Service: Gynecology;   Laterality: N/A;   KNEE ARTHROSCOPY Left 2018   Social History   Socioeconomic History   Marital status: Single    Spouse name: Not on file   Number of children: Not on file   Years of education: Not on file   Highest education level: Not on file  Occupational History   Not on file  Tobacco Use   Smoking status: Former    Packs/day: 1.00    Years: 5.00    Total pack years: 5.00    Types: Cigarettes    Quit date: 12/29/2018    Years since quitting: 3.0   Smokeless tobacco: Never   Tobacco comments:    stopped on 12/29/2018  Vaping Use   Vaping Use: Never used  Substance and Sexual Activity   Alcohol use: Not Currently    Alcohol/week: 0.0 standard drinks of alcohol   Drug use: No   Sexual activity: Not Currently    Birth control/protection: Abstinence  Other Topics Concern   Not on file  Social History Narrative   Work or Database administrator, [employer]      Home Situation: lives with roomate      Spiritual Beliefs: Christian      Lifestyle: CV exercise (63mnutes) 3 days per week; diet is poor  Social Determinants of Health   Financial Resource Strain: Not on file  Food Insecurity: No Food Insecurity (12/06/2019)   Hunger Vital Sign    Worried About Running Out of Food in the Last Year: Never true    Ran Out of Food in the Last Year: Never true  Transportation Needs: Not on file  Physical Activity: Not on file  Stress: Not on file  Social Connections: Not on file   Family History  Problem Relation Age of Onset   Leukemia Paternal Grandfather    Lung cancer Paternal Grandfather    Arthritis Paternal Grandfather    Diabetes Paternal Grandfather    Heart disease Paternal Grandfather    Hypertension Paternal Grandfather    Kidney disease Paternal Grandfather    Prostate cancer Paternal Grandfather    Gaucher's disease Paternal 1    Sudden death Father        age 29, nothing found on autopsy   Diabetes Father    Hyperlipidemia Father     Hypertension Father    Mental illness Mother        bipolar, schizophrenia   Alcohol abuse Maternal Grandfather    Stroke Paternal Grandmother    Colon cancer Neg Hx    Allergies  Allergen Reactions   Codeine Shortness Of Breath   Hydrocodone Shortness Of Breath   Iodine Other (See Comments)    Shellfish allergy   Shellfish Allergy Anaphylaxis   Aspirin Hives   Current Outpatient Medications  Medication Sig Dispense Refill   celecoxib (CELEBREX) 200 MG capsule Take 1 capsule (200 mg total) by mouth 2 (two) times daily between meals as needed. 60 capsule 1   fluconazole (DIFLUCAN) 150 MG tablet Take one tablet by mouth at the first sign of symptoms of yeast. If no resolution, repeat dose in 72 hours. 2 tablet 2   gabapentin (NEURONTIN) 300 MG capsule Take 1 capsule (300 mg total) by mouth at bedtime. Can increase to twice daily after 5 days if tolerating well. 30 capsule 1   hydrOXYzine (ATARAX) 50 MG tablet Take 0.5-1 tablets (25-50 mg total) by mouth at bedtime as needed. For anxiety and sleep. 30 tablet 3   ibuprofen (ADVIL) 800 MG tablet Take 1 tablet (800 mg total) by mouth 3 (three) times daily. 90 tablet 1   sertraline (ZOLOFT) 50 MG tablet Take 1/2 tab by mouth at bedtime for the first 4 days then increase to full tab at bedtime. 30 tablet 3   Vitamin D, Ergocalciferol, (DRISDOL) 1.25 MG (50000 UNIT) CAPS capsule Take 1 capsule (50,000 Units total) by mouth every 7 (seven) days. Take for 12 total doses(weeks) than can transition to 1000 units OTC supplement daily 12 capsule 3   No current facility-administered medications for this visit.   No results found.  Review of Systems:   A ROS was performed including pertinent positives and negatives as documented in the HPI.  Physical Exam :   Constitutional: NAD and appears stated age Neurological: Alert and oriented Psych: Appropriate affect and cooperative There were no vitals taken for this visit.   Comprehensive  Musculoskeletal Exam:    Positive Tinel with flexion of the elbow.  Numbness subjectively in the small digit.  There is some flexion of the small digit is consistent with intrinsic weakness  Significant tenderness to palpation about the ECU tendon of the left wrist.  Imaging:     I personally reviewed and interpreted the radiographs.   Assessment:   41 y.o. female  with acute left hand pain and weakness in the ulnar distribution.  Her EMG nerve conduction test was not consistent with any type of neuropathy.  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  At this time I described that I would like to order an MRI of the thoracic outlet as well as of the cervical spine so that we can rule out any type of thoracic rib in addition to any type of cervical radiculopathy.  I do believe a consultation is in order for Dr. Unk Lightning as well so that we can rule out any type of underlying vascular thoracic outlet and be certain this is purely neurogenic type. Plan :    -Return to clinic following MRI as well as consultation with Dr. Amil Amen    I personally saw and evaluated the patient, and participated in the management and treatment plan.  Vanetta Mulders, MD Attending Physician, Orthopedic Surgery  This document was dictated using Dragon voice recognition software. A reasonable attempt at proof reading has been made to minimize errors.

## 2022-01-14 LAB — GAUCHER ENZYME ANALYSIS: Glucocerebrosidase/WBC: 4.248 (ref 7.5–14.5)

## 2022-01-15 ENCOUNTER — Telehealth (INDEPENDENT_AMBULATORY_CARE_PROVIDER_SITE_OTHER): Payer: 59 | Admitting: Nurse Practitioner

## 2022-01-15 ENCOUNTER — Encounter: Payer: Self-pay | Admitting: Nurse Practitioner

## 2022-01-15 VITALS — Ht 66.0 in | Wt 215.0 lb

## 2022-01-15 DIAGNOSIS — E7522 Gaucher disease: Secondary | ICD-10-CM | POA: Diagnosis not present

## 2022-01-16 ENCOUNTER — Other Ambulatory Visit: Payer: Self-pay | Admitting: *Deleted

## 2022-01-16 DIAGNOSIS — M79644 Pain in right finger(s): Secondary | ICD-10-CM

## 2022-01-19 DIAGNOSIS — E7522 Gaucher disease: Secondary | ICD-10-CM | POA: Insufficient documentation

## 2022-01-19 NOTE — Assessment & Plan Note (Addendum)
Discussed findings of labs for Gaucher disease with Kihanna and my interpretation of the results. 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 We discussed that this is specialized testing that I will need to research to evaluate further. She has done some research on her own and has found a provider who specializes in this condition. Given the specific testing required, I feel that referral and evaluation is most appropriate by the specialist for complete and appropriate evaluation. She does have symptoms typically associated with the condition including; joint pain, anemia with elevated ferritin, easy bruising, GI concerns, and exhaustion. If insurance issues arise, I am hopeful that the presence of known deficiency and family history in her grandfather will allow for evaluation with the specialist. We will plan to reconvene once she has had the opportunity to meet with the specialist.

## 2022-01-19 NOTE — Progress Notes (Signed)
Virtual Visit Encounter mychart visit.   I connected with  Betty Leonard on 01/19/22 at  4:15 PM EST by secure video and audio telemedicine application. I verified that I am speaking with the correct person using two identifiers.   I introduced myself as a Designer, jewellery with the practice. The limitations of evaluation and management by telemedicine discussed with the patient and the availability of in person appointments. The patient expressed verbal understanding and consent to proceed.  Participating parties in this visit include: Myself and patient  The patient is: Patient Location: Home I am: Provider Location: Office/Clinic Subjective:    CC and HPI: Betty Leonard is a 42 y.o. year old female presenting for follow up of labs. Patient reports the following: Discussion of labs Betty Leonard recently underwent labs testing for evaluation for Gaucher disease as she does have a family history of this. She would like to discuss the results and next steps.   Past medical history, Surgical history, Family history not pertinant except as noted below, Social history, Allergies, and medications have been entered into the medical record, reviewed, and corrections made.   Review of Systems:  All review of systems negative except what is listed in the HPI  Objective:    Alert and oriented x 4 Speaking in clear sentences with no shortness of breath. No distress.  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 We discussed that this is specialized testing that I will need to research to evaluate further. She has done some  research on her own and has found a provider who specializes in this condition. Given the specific testing required, I feel that referral and evaluation is most appropriate by the specialist for complete and appropriate evaluation. She does have symptoms typically associated with the condition including; joint pain, anemia with elevated ferritin, easy bruising, GI concerns, and exhaustion. If insurance issues arise, I am hopeful that the presence of known deficiency and family history in her grandfather will allow for evaluation with the specialist. We will plan to reconvene once she has had the opportunity to meet with the specialist.       Relevant Orders   Ambulatory referral to Genetics    current treatment plan is effective, no change in therapy, orders and follow up as documented in EMR I discussed the assessment and treatment plan with the patient. The patient was provided an opportunity to ask questions and all were answered. The patient agreed with the plan and demonstrated an understanding of the instructions.   The patient was advised to call back or seek an in-person evaluation if the symptoms worsen or if the condition fails to improve as anticipated.  Follow-Up: after labs and/or imaging, consults, etc. have been completed  I provided 28 minutes of non-face-to-face interaction with this non face-to-face encounter including intake, same-day documentation, and chart review.   Orma Render, NP , DNP, AGNP-c [facility]

## 2022-01-20 ENCOUNTER — Encounter: Payer: Self-pay | Admitting: Nurse Practitioner

## 2022-01-22 ENCOUNTER — Other Ambulatory Visit: Payer: Self-pay | Admitting: Nurse Practitioner

## 2022-01-22 ENCOUNTER — Other Ambulatory Visit (HOSPITAL_BASED_OUTPATIENT_CLINIC_OR_DEPARTMENT_OTHER): Payer: Self-pay

## 2022-01-22 DIAGNOSIS — E7522 Gaucher disease: Secondary | ICD-10-CM

## 2022-01-22 MED ORDER — LEVOFLOXACIN 750 MG PO TABS
ORAL_TABLET | ORAL | 0 refills | Status: DC
  Filled 2022-01-22: qty 5, 5d supply, fill #0

## 2022-01-22 MED ORDER — FLUCONAZOLE 150 MG PO TABS
ORAL_TABLET | ORAL | 0 refills | Status: DC
  Filled 2022-01-22: qty 1, 1d supply, fill #0

## 2022-01-23 ENCOUNTER — Telehealth: Payer: Self-pay | Admitting: Genetic Counselor

## 2022-01-23 ENCOUNTER — Encounter: Payer: Self-pay | Admitting: Nurse Practitioner

## 2022-01-23 ENCOUNTER — Other Ambulatory Visit: Payer: Self-pay | Admitting: Nurse Practitioner

## 2022-01-23 DIAGNOSIS — E7522 Gaucher disease: Secondary | ICD-10-CM

## 2022-01-23 NOTE — Telephone Encounter (Signed)
Let paitent know that I had spoken with a genetic counselor from [facility].  I was given instructions on how to make the referral and what to include in it.  I have submitted that to her PCP Jacolyn Reedy, PA so that she is able to make the referral in a way that will be realized it needs to be processed more quickly.  If there are still issues getting her in for evaluation, we can contact [employer], the company that makes Gaucher disease enzyme.  They have a patient advocate that may be able to help.

## 2022-01-24 ENCOUNTER — Other Ambulatory Visit: Payer: 59

## 2022-01-26 ENCOUNTER — Ambulatory Visit
Admission: RE | Admit: 2022-01-26 | Discharge: 2022-01-26 | Disposition: A | Discharge status: Home / Self Care | Payer: 59 | Source: Ambulatory Visit | Attending: Orthopaedic Surgery | Admitting: Orthopaedic Surgery

## 2022-01-26 DIAGNOSIS — G540 Brachial plexus disorders: Secondary | ICD-10-CM

## 2022-01-27 ENCOUNTER — Encounter: Payer: Self-pay | Admitting: Internal Medicine

## 2022-01-28 ENCOUNTER — Telehealth: Payer: Self-pay | Admitting: Orthopaedic Surgery

## 2022-01-28 NOTE — Telephone Encounter (Signed)
RC to patient. Informed her Dr. Sammuel Hines would like to evaluate her in office at her scheduled appointment. No further questions

## 2022-01-28 NOTE — Telephone Encounter (Signed)
Patient called asked if she can get a call back concerning her MRI results? The number to contact patient is [phone]

## 2022-02-02 ENCOUNTER — Ambulatory Visit (HOSPITAL_COMMUNITY)
Admission: RE | Admit: 2022-02-02 | Discharge: 2022-02-02 | Disposition: A | Discharge status: Home / Self Care | Payer: 59 | Source: Ambulatory Visit | Attending: Nurse Practitioner | Admitting: Nurse Practitioner

## 2022-02-02 ENCOUNTER — Ambulatory Visit (INDEPENDENT_AMBULATORY_CARE_PROVIDER_SITE_OTHER): Payer: 59 | Admitting: Surgery

## 2022-02-02 ENCOUNTER — Encounter: Payer: Self-pay | Admitting: Nurse Practitioner

## 2022-02-02 ENCOUNTER — Ambulatory Visit (INDEPENDENT_AMBULATORY_CARE_PROVIDER_SITE_OTHER): Payer: 59 | Admitting: Nurse Practitioner

## 2022-02-02 ENCOUNTER — Encounter: Payer: Self-pay | Admitting: Surgery

## 2022-02-02 ENCOUNTER — Ambulatory Visit (HOSPITAL_COMMUNITY)
Admission: RE | Admit: 2022-02-02 | Discharge: 2022-02-02 | Disposition: A | Discharge status: Home / Self Care | Payer: 59 | Source: Ambulatory Visit | Attending: Surgery | Admitting: Surgery

## 2022-02-02 VITALS — BP 120/80 | HR 76 | Temp 98.3°F | Wt 236.0 lb

## 2022-02-02 VITALS — BP 105/74 | HR 70 | Temp 98.2°F | Resp 20 | Ht 66.0 in | Wt 235.0 lb

## 2022-02-02 DIAGNOSIS — M79644 Pain in right finger(s): Secondary | ICD-10-CM | POA: Diagnosis not present

## 2022-02-02 DIAGNOSIS — G540 Brachial plexus disorders: Secondary | ICD-10-CM | POA: Diagnosis not present

## 2022-02-02 DIAGNOSIS — S6992XA Unspecified injury of left wrist, hand and finger(s), initial encounter: Secondary | ICD-10-CM | POA: Insufficient documentation

## 2022-02-02 DIAGNOSIS — M79645 Pain in left finger(s): Secondary | ICD-10-CM | POA: Insufficient documentation

## 2022-02-02 NOTE — Progress Notes (Signed)
  Orma Render, DNP, AGNP-c [facility] [address] [place], [place] [address] [phone]  Subjective:   Betty Leonard is a 42 y.o. female presents to day for evaluation of: Left Middle Finger Pain Ivah reports she was busy running around last night and somehow injured her left middle finger at the MIP joint. She tells me she felt pain at bedtime and when she woke up this morning she noted the finger is bruised and swollen and very painful. She is unable to bend the finger at any of the joints due to pain. The pain extends into the PIP and hand along the carpals. She is left handed.   PMH, Medications, and Allergies reviewed and updated in chart as appropriate.   ROS negative except for what is listed in HPI. Objective:  BP 120/80   Pulse 76   Temp 98.3 F (36.8 C)   Wt 236 lb (107 kg)   BMI 38.09 kg/m  Physical Exam Vitals and nursing note reviewed.  Constitutional:      Appearance: Normal appearance.  HENT:     Head: Normocephalic.  Cardiovascular:     Rate and Rhythm: Normal rate.     Pulses: Normal pulses.  Pulmonary:     Effort: Pulmonary effort is normal.  Musculoskeletal:     Right hand: Normal.     Left hand: Swelling and bony tenderness present. Decreased range of motion. Decreased strength. Normal sensation. Normal capillary refill. Normal pulse.     Cervical back: Normal range of motion.     Comments: Left middle finger edematous with dark ecchymosis present to the MIP and surrounding tissue. Diffuse tenderness noted in the middle finger and into the carpals in the hand.   Skin:    General: Skin is warm and dry.     Capillary Refill: Capillary refill takes less than 2 seconds.     Findings: Bruising present.  Neurological:     General: No focal deficit present.     Mental Status: She is alert and oriented to person, place, and time.     Cranial Nerves: No cranial nerve deficit.     Sensory: No sensory deficit.     Motor: No weakness.      Gait: Gait normal.  Psychiatric:        Mood and Affect: Mood normal.        Behavior: Behavior normal.        Thought Content: Thought content normal.        Judgment: Judgment normal.           Assessment & Plan:   Problem List Items Addressed This Visit     Injury of left hand - Primary    Suspected fracture to left middle finger following injury. Given the swelling and pain, we will plan to get an x-ray to confirm and determine if further evaluation is needed. Middle and ring finger buddy taped together and ace wrap applied to the two fingers and into the wrist to help with stability and to reduce movement. We will determine further follow-up once x-ray has been completed.       Relevant Orders   DG Hand Complete Left (Completed)      Orma Render, DNP, AGNP-c 02/02/2022  8:08 PM    History, Medications, Surgery, SDOH, and Family History reviewed and updated as appropriate.

## 2022-02-02 NOTE — Assessment & Plan Note (Signed)
Suspected fracture to left middle finger following injury. Given the swelling and pain, we will plan to get an x-ray to confirm and determine if further evaluation is needed. Middle and ring finger buddy taped together and ace wrap applied to the two fingers and into the wrist to help with stability and to reduce movement. We will determine further follow-up once x-ray has been completed.

## 2022-02-02 NOTE — Progress Notes (Signed)
[facility]  Patient name: Betty Leonard MRN: [id number] DOB: September 08, 1980 Sex: female   REQUESTING PROVIDER:    Dr. Sammuel Hines   REASON FOR CONSULT:    Possible thoracic outlet  HISTORY OF PRESENT ILLNESS:   Betty Leonard is a 42 y.o. female, who is referred for evaluation of possible thoracic outlet syndrome.  She has undergone EMG/nerve conduction studies of her left hand which were unremarkable.  On MRI, she has a small central disc protrusion at C6-7 and moderate left foraminal narrowing at C7.  There is also mild disc bulging at C5-C6.  She states that she has been having symptoms for approximately 2 months.  It first began with numbness along the lateral aspect of her left fifth finger.  It gets worse when she raises her hand above her shoulder.  She has begun to lose strength in her arm and drops things.  She experiences pins-and-needles very frequently.  Her symptoms are now moving up towards her collarbone.  The patient is being evaluated forGaucher's disease.  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  She is dealing with anemia which is being treated with iron infusions.  She has previously had endometrial ablation.  She is also suffering from hair loss and joint issues.  She is currently undergoing further genetic testing to find out what mutations she has.  PAST MEDICAL HISTORY    Past Medical History:  Diagnosis Date   Anemia    iron infusion sept 2020   Chicken pox    Depression    Esophagitis 12/24/2011   GERD (gastroesophageal reflux disease)    History of wheezing 06/06/2018   Menorrhagia    with irregular cycles   Migraines    Pneumonia 07/2018   Seasonal allergies    Sinusitis 11/12/2021   UTI (urinary tract infection) finished antibiotic 04-11-2019     FAMILY HISTORY   Family History  Problem Relation Age of Onset   Leukemia Paternal Grandfather    Lung cancer Paternal  Grandfather    Arthritis Paternal Grandfather    Diabetes Paternal Grandfather    Heart disease Paternal Grandfather    Hypertension Paternal Grandfather    Kidney disease Paternal Grandfather    Prostate cancer Paternal Grandfather    Gaucher's disease Paternal Grandfather    Sudden death Father        age 2, nothing found on autopsy   Diabetes Father    Hyperlipidemia Father    Hypertension Father    Mental illness Mother        bipolar, schizophrenia   Alcohol abuse Maternal Grandfather    Stroke Paternal Grandmother    Colon cancer Neg Hx     SOCIAL HISTORY:   Social History   Socioeconomic History   Marital status: Single    Spouse name: Not on file   Number of children: Not on file   Years of education: Not on file   Highest education level: Not on file  Occupational History   Not on file  Tobacco Use   Smoking status: Former    Packs/day: 1.00    Years: 5.00    Total pack years: 5.00    Types: Cigarettes    Quit date: 12/29/2018    Years since quitting: 3.0   Smokeless tobacco: Never   Tobacco comments:    stopped on 12/29/2018  Vaping Use   Vaping Use: Never used  Substance and Sexual Activity  Alcohol use: Not Currently    Alcohol/week: 0.0 standard drinks of alcohol   Drug use: No   Sexual activity: Not Currently    Birth control/protection: Abstinence  Other Topics Concern   Not on file  Social History Narrative   Work or Database administrator, [employer]      Home Situation: lives with roomate      Spiritual Beliefs: Christian      Lifestyle: CV exercise (41mnutes) 3 days per week; diet is poor               Social Determinants of HRadio broadcast assistantStrain: Not on file  Food Insecurity: No Food Insecurity (12/06/2019)   Hunger Vital Sign    Worried About Running Out of Food in the Last Year: Never true    Ran Out of Food in the Last Year: Never true  Transportation Needs: Not on file  Physical Activity: Not on file  Stress: Not  on file  Social Connections: Not on file  Intimate Partner Violence: Not on file    ALLERGIES:    Allergies  Allergen Reactions   Codeine Shortness Of Breath   Hydrocodone Shortness Of Breath   Iodine Other (See Comments)    Shellfish allergy   Shellfish Allergy Anaphylaxis   Aspirin Hives    CURRENT MEDICATIONS:    Current Outpatient Medications  Medication Sig Dispense Refill   EPINEPHrine 0.3 mg/0.3 mL IJ SOAJ injection Inject 0.3 mg into the muscle as needed for anaphylaxis.     hydrOXYzine (ATARAX) 50 MG tablet Take 0.5-1 tablets (25-50 mg total) by mouth at bedtime as needed. For anxiety and sleep. 30 tablet 3   ibuprofen (ADVIL) 800 MG tablet Take 1 tablet (800 mg total) by mouth 3 (three) times daily. 90 tablet 1   sertraline (ZOLOFT) 50 MG tablet Take 1/2 tab by mouth at bedtime for the first 4 days then increase to full tab at bedtime. 30 tablet 3   Vitamin D, Ergocalciferol, (DRISDOL) 1.25 MG (50000 UNIT) CAPS capsule Take 1 capsule (50,000 Units total) by mouth every 7 (seven) days. Take for 12 total doses(weeks) than can transition to 1000 units OTC supplement daily 12 capsule 3   No current facility-administered medications for this visit.    REVIEW OF SYSTEMS:   '[X]'$  denotes positive finding, '[ ]'$  denotes negative finding Cardiac  Comments:  Chest pain or chest pressure:    Shortness of breath upon exertion:    Short of breath when lying flat:    Irregular heart rhythm:        Vascular    Pain in calf, thigh, or hip brought on by ambulation:    Pain in feet at night that wakes you up from your sleep:     Blood clot in your veins:    Leg swelling:         Pulmonary    Oxygen at home:    Productive cough:     Wheezing:         Neurologic    Sudden weakness in arms or legs:     Sudden numbness in arms or legs:     Sudden onset of difficulty speaking or slurred speech:    Temporary loss of vision in one eye:     Problems with dizziness:          Gastrointestinal    Blood in stool:      Vomited blood:         Genitourinary  Burning when urinating:     Blood in urine:        Psychiatric    Major depression:         Hematologic    Bleeding problems:    Problems with blood clotting too easily:        Skin    Rashes or ulcers:        Constitutional    Fever or chills:     PHYSICAL EXAM:   Vitals:   02/02/22 0817  BP: 105/74  Pulse: 70  Resp: 20  Temp: 98.2 F (36.8 C)  SpO2: 95%  Weight: 235 lb (106.6 kg)  Height: '5\' 6"'$  (1.676 m)    GENERAL: The patient is a well-nourished female, in no acute distress. The vital signs are documented above. CARDIAC: There is a regular rate and rhythm.  VASCULAR: Negative Allen test.  She does not lose her left radial pulse with arm elevation. PULMONARY: Nonlabored respirations NEUROLOGIC: No focal weakness or paresthesias are detected. SKIN: There are no ulcers or rashes noted. PSYCHIATRIC: The patient has a normal affect.  STUDIES:   I have reviewed her MRI with the following findings: 1. Small central disc protrusion with uncovertebral spurring at C6-7 with resultant mild spinal stenosis, with mild to moderate left C7 foraminal narrowing. 2. Mild disc bulging with tiny central disc protrusion at C5-6 without significant stenosis or impingement. 3. 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 No syrinx.  ASSESSMENT and PLAN   Possible thoracic outlet syndrome, left: The patient recently underwent a MRI that does show cervical disc bulging.  She is following up with orthopedics later this week.  If it is felt that her cervical spine issues are not responsible for her symptoms, and thoracic outlet syndrome is suspected, the first step would be a several month trial of physical therapy which has already been scheduled.  I told her I would bring her back after that in 2 months to see how she is doing.  If she continues to  have symptoms without any other explanation, we would consider scalene nerve block to determine whether or not first rib resection would be beneficial.   Annamarie Major, IV, MD, FACS [facility] [phone] Pager [phone]

## 2022-02-02 NOTE — Patient Instructions (Signed)
I want you to go get an x-ray of your hand to see if this is a fracture. If it is a fracture, we will determine if casting is needed or if we can get away with a brace.   The -ray is [address] at [facility]. You can walk in to have this done.   I will be in touch with you once we have this back to determine what to do next. In the meantime please keep the finger buddy taped and wrapped to protect it the best you can.

## 2022-02-06 ENCOUNTER — Ambulatory Visit (INDEPENDENT_AMBULATORY_CARE_PROVIDER_SITE_OTHER): Payer: 59 | Admitting: Orthopaedic Surgery

## 2022-02-06 DIAGNOSIS — G540 Brachial plexus disorders: Secondary | ICD-10-CM | POA: Diagnosis not present

## 2022-02-06 NOTE — Progress Notes (Signed)
Chief Complaint: Left hand pain and numbness     History of Present Illness:   02/06/2022: Today for follow-up of her possible thoracic outlet syndrome.  At today's visit she still continues to have numbness without any evidence of abnormality on peripheral EMG nerve conduction test.  She is also here today for MRI follow-up.  She has subsequently met with vascular surgery.  Betty Leonard is a 42 y.o. female left-hand-dominant female presents today with persistent ongoing numbness in the small finger with occasional shooting up and down the forearm.  She states that this came on quite suddenly 1 week ago.  She has been on gabapentin and Celebrex from Dr. Ninfa Linden which did not help her at all.  She feels like the hand is getting weak and is having a hard time opening jars.  She is experiencing numbness in the small 2 fingers.  This is worse at night.  She works in Press photographer    Surgical History:   None  PMH/PSH/Family History/Social History/Meds/Allergies:    Past Medical History:  Diagnosis Date   Anemia    iron infusion sept 2020   Chicken pox    Depression    Esophagitis 12/24/2011   GERD (gastroesophageal reflux disease)    History of wheezing 06/06/2018   Menorrhagia    with irregular cycles   Migraines    Pneumonia 07/2018   Seasonal allergies    Sinusitis 11/12/2021   UTI (urinary tract infection) finished antibiotic 04-11-2019   Past Surgical History:  Procedure Laterality Date   ESOPHAGOGASTRODUODENOSCOPY  12/24/2011   Procedure: ESOPHAGOGASTRODUODENOSCOPY (EGD);  Surgeon: Inda Castle, MD;  Location: [facility];  Service: Endoscopy;  Laterality: N/A;   FOOT SURGERY Right 2009   repair remotely after a fracture   FOOT SURGERY Right 1'2020   HYSTEROSCOPY WITH NOVASURE N/A 04/25/2019   Procedure: HYSTEROSCOPY WITH NOVASURE ABLATION;  Surgeon: Megan Salon, MD;  [facility];  Service: Gynecology;   Laterality: N/A;   KNEE ARTHROSCOPY Left 2018   Social History   Socioeconomic History   Marital status: Single    Spouse name: Not on file   Number of children: Not on file   Years of education: Not on file   Highest education level: Not on file  Occupational History   Not on file  Tobacco Use   Smoking status: Former    Packs/day: 1.00    Years: 5.00    Total pack years: 5.00    Types: Cigarettes    Quit date: 12/29/2018    Years since quitting: 3.1   Smokeless tobacco: Never   Tobacco comments:    stopped on 12/29/2018  Vaping Use   Vaping Use: Never used  Substance and Sexual Activity   Alcohol use: Not Currently    Alcohol/week: 0.0 standard drinks of alcohol   Drug use: No   Sexual activity: Not Currently    Birth control/protection: Abstinence  Other Topics Concern   Not on file  Social History Narrative   Work or Database administrator, [employer]      Home Situation: lives with roomate      Spiritual Beliefs: Christian      Lifestyle: CV exercise (45mnutes) 3 days per week; diet is poor  Social Determinants of Health   Financial Resource Strain: Not on file  Food Insecurity: No Food Insecurity (12/06/2019)   Hunger Vital Sign    Worried About Running Out of Food in the Last Year: Never true    Ran Out of Food in the Last Year: Never true  Transportation Needs: Not on file  Physical Activity: Not on file  Stress: Not on file  Social Connections: Not on file   Family History  Problem Relation Age of Onset   Leukemia Paternal Grandfather    Lung cancer Paternal Grandfather    Arthritis Paternal Grandfather    Diabetes Paternal Grandfather    Heart disease Paternal Grandfather    Hypertension Paternal Grandfather    Kidney disease Paternal Grandfather    Prostate cancer Paternal Grandfather    Gaucher's disease Paternal 55    Sudden death Father        age 86, nothing found on autopsy   Diabetes Father    Hyperlipidemia Father     Hypertension Father    Mental illness Mother        bipolar, schizophrenia   Alcohol abuse Maternal Grandfather    Stroke Paternal Grandmother    Colon cancer Neg Hx    Allergies  Allergen Reactions   Codeine Shortness Of Breath   Hydrocodone Shortness Of Breath   Iodine Other (See Comments)    Shellfish allergy   Shellfish Allergy Anaphylaxis   Aspirin Hives   Current Outpatient Medications  Medication Sig Dispense Refill   EPINEPHrine 0.3 mg/0.3 mL IJ SOAJ injection Inject 0.3 mg into the muscle as needed for anaphylaxis. (Patient not taking: Reported on 02/02/2022)     hydrOXYzine (ATARAX) 50 MG tablet Take 0.5-1 tablets (25-50 mg total) by mouth at bedtime as needed. For anxiety and sleep. (Patient not taking: Reported on 02/02/2022) 30 tablet 3   ibuprofen (ADVIL) 800 MG tablet Take 1 tablet (800 mg total) by mouth 3 (three) times daily. (Patient not taking: Reported on 02/02/2022) 90 tablet 1   sertraline (ZOLOFT) 50 MG tablet Take 1/2 tab by mouth at bedtime for the first 4 days then increase to full tab at bedtime. 30 tablet 3   Vitamin D, Ergocalciferol, (DRISDOL) 1.25 MG (50000 UNIT) CAPS capsule Take 1 capsule (50,000 Units total) by mouth every 7 (seven) days. Take for 12 total doses(weeks) than can transition to 1000 units OTC supplement daily 12 capsule 3   No current facility-administered medications for this visit.   No results found.  Review of Systems:   A ROS was performed including pertinent positives and negatives as documented in the HPI.  Physical Exam :   Constitutional: NAD and appears stated age Neurological: Alert and oriented Psych: Appropriate affect and cooperative There were no vitals taken for this visit.   Comprehensive Musculoskeletal Exam:    Positive Tinel with flexion of the elbow.  Numbness subjectively in the small digit.  There is some flexion of the small digit is consistent with intrinsic weakness  Significant tenderness to palpation  about the ECU tendon of the left wrist.  Imaging:     I personally reviewed and interpreted the radiographs.   Assessment:   42 y.o. female with acute left hand pain and weakness in the ulnar distribution.  Her EMG nerve conduction test was not consistent with any type of neuropathy.  Given that I do believe that this may more likely represent a thoracic outlet syndrome given the fact that she is pain and  numbness with overhead activity.  At this time I would like her to aggressively work on anterior chain mobilization and posterior chain strengthening as I do believe that a tight pectoralis minor is contributing to her thoracic outlet.  We will plan to proceed with this for physical therapy and I will see her back in 2 months for reassessment Plan :    -Return to clinic in 2 months for reassessment   I personally saw and evaluated the patient, and participated in the management and treatment plan.  Vanetta Mulders, MD Attending Physician, Orthopedic Surgery  This document was dictated using Dragon voice recognition software. A reasonable attempt at proof reading has been made to minimize errors.

## 2022-02-10 ENCOUNTER — Encounter (HOSPITAL_BASED_OUTPATIENT_CLINIC_OR_DEPARTMENT_OTHER): Payer: Self-pay | Admitting: Physical Therapy

## 2022-02-10 ENCOUNTER — Ambulatory Visit (HOSPITAL_BASED_OUTPATIENT_CLINIC_OR_DEPARTMENT_OTHER): Payer: 59 | Attending: Orthopaedic Surgery | Admitting: Physical Therapy

## 2022-02-10 ENCOUNTER — Other Ambulatory Visit: Payer: Self-pay

## 2022-02-10 DIAGNOSIS — G540 Brachial plexus disorders: Secondary | ICD-10-CM | POA: Insufficient documentation

## 2022-02-10 DIAGNOSIS — M25512 Pain in left shoulder: Secondary | ICD-10-CM | POA: Insufficient documentation

## 2022-02-10 DIAGNOSIS — R531 Weakness: Secondary | ICD-10-CM | POA: Insufficient documentation

## 2022-02-10 NOTE — Therapy (Signed)
OUTPATIENT PHYSICAL THERAPY UPPER EXTREMITY EVALUATION   Patient Name: Betty Leonard MRN: [id number] DOB:1980-09-06, 42 y.o., female Today's Date: 02/10/2022  END OF SESSION:   Past Medical History:  Diagnosis Date   Anemia    iron infusion sept 2020   Chicken pox    Depression    Esophagitis 12/24/2011   GERD (gastroesophageal reflux disease)    History of wheezing 06/06/2018   Menorrhagia    with irregular cycles   Migraines    Pneumonia 07/2018   Seasonal allergies    Sinusitis 11/12/2021   UTI (urinary tract infection) finished antibiotic 04-11-2019   Past Surgical History:  Procedure Laterality Date   ESOPHAGOGASTRODUODENOSCOPY  12/24/2011   Procedure: ESOPHAGOGASTRODUODENOSCOPY (EGD);  Surgeon: Inda Castle, MD;  Location: [facility];  Service: Endoscopy;  Laterality: N/A;   FOOT SURGERY Right 2009   repair remotely after a fracture   FOOT SURGERY Right 1'2020   HYSTEROSCOPY WITH NOVASURE N/A 04/25/2019   Procedure: HYSTEROSCOPY WITH NOVASURE ABLATION;  Surgeon: Megan Salon, MD;  Location: [facility];  Service: Gynecology;  Laterality: N/A;   KNEE ARTHROSCOPY Left 2018   Patient Active Problem List   Diagnosis Date Noted   Injury of left hand 02/02/2022   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 Deatra Ina and GI 12/16/2011    PCP: Emeterio Reeve Early   REFERRING PROVIDER: Dr Vanetta Mulders   REFERRING  DIAG:G54.0 (ICD-10-CM) - Thoracic outlet syndrome    THERAPY DIAG:  Acute pain of left shoulder  Weakness  Rationale for Evaluation and Treatment: Rehabilitation  ONSET DATE:   SUBJECTIVE:                                                                                                                                                                                      SUBJECTIVE STATEMENT: Patient will an insidious onset of left upper extremity numbness and weakness about 2 months ago.  She experiences paresthesias throughout the day.  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  She is left-handed.  She feels the most pain when she is  sleeping.  Her cervical MRI showed mild degeneration.  Per MD note her nerve conduction testing did not show a cervical origin of paresthesias.   PERTINENT HISTORY: Migrans; anemia   PAIN:  Are you having pain? Yes: NPRS scale: 3/10 worst  can reach a 10/10  Pain location: starts int he hand and can go up  Pain description: aching/ numbness/  Aggravating factors: reaching over head and night time  Relieving factors: very hot shower   PRECAUTIONS: Charo Malformation   WEIGHT BEARING RESTRICTIONS: No  FALLS:  Has patient fallen in last 6 months? No  LIVING ENVIRONMENT:   OCCUPATION: Accountant   Recreation: Nothing particular    PLOF: Independent  PATIENT GOALS: Less pain   NEXT MD VISIT:   OBJECTIVE:   DIAGNOSTIC FINDINGS:  IMPRESSION: 1. Small central disc protrusion with uncovertebral spurring at C6-7 with resultant mild spinal stenosis, with mild to moderate left C7 foraminal narrowing. 2. Mild disc bulging with tiny central disc protrusion at C5-6 without significant stenosis or impingement. 3. 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 No syrinx.  PATIENT SURVEYS :  FOTO    COGNITION: Overall cognitive status: Within functional limits for tasks  assessed     SENSATION: Numbness into the hand and shooting pain down the arm  POSTURE: Rounded shoulder and forward head   UPPER EXTREMITY ROM:   Active ROM Right eval Left eval  Shoulder flexion  Causes numbness  Shoulder extension    Shoulder abduction    Shoulder adduction    Shoulder internal rotation  Causes numbness   Shoulder external rotation  Causes numbness    Elbow flexion    Elbow extension    Wrist flexion    Wrist extension    Wrist ulnar deviation    Wrist radial deviation    Wrist pronation    Wrist supination    (Blank rows = not tested)  Grip:  L 5 lbs  R 40 lbs    UPPER EXTREMITY MMT:  MMT Right eval Left eval  Shoulder flexion 5 4  Shoulder extension    Shoulder abduction 5 4  Shoulder adduction    Shoulder internal rotation 5 4  Shoulder external rotation    Middle trapezius    Lower trapezius    Elbow flexion    Elbow extension    Wrist flexion    Wrist extension    Wrist ulnar deviation    Wrist radial deviation    Wrist pronation    Wrist supination    Grip strength (lbs)    (Blank rows = not tested)  SHOULDER SPECIAL TESTS: Not tested 2nd to numbness with all movements  JOINT MOBILITY TESTING:    PALPATION:  Significant tenderness to palpation in the pec and upper trap    TODAY'S TREATMENT:  DATE:   PATIENT EDUCATION: Education details: HEP, symptom management  Person educated: Patient Education method: Explanation, Demonstration, Tactile cues, Verbal cues, and Handouts Education comprehension: verbalized understanding, returned demonstration, verbal cues required, tactile cues required, and needs further education  HOME EXERCISE PROGRAM: Access Code: [email or web address] URL: [email or web address] Date: 02/12/2022 Prepared by: Carolyne Littles  Exercises - Seated Scapular  Retraction  - 1 x daily - 7 x weekly - 3 sets - 10 reps - Supine Chest Stretch on Foam Roll  - 1 x daily - 7 x weekly - 3 sets - 1-2 min  hold - Sidelying Open Book Thoracic Rotation with Knee on Foam Roll  - 1 x daily - 7 x weekly - 3 sets - 10 reps - 3-5 sec  hold - Doorway Pec Stretch at 60 Degrees Abduction with Arm Straight  - 1 x daily - 7 x weekly - 3 sets - 10 reps ASSESSMENT:  CLINICAL IMPRESSION: Patient presents with acute insidious onset of left shoulder and arm numbness and pain starting 2 months ago.  She is left-handed.  Signs and symptoms are consistent with diagnosis of thoracic outlet syndrome.  She was reactive to overhead movement, reaching behind her head, and reaching behind her back.  She has significant limitations in general left shoulder strength and grip strength.  She would benefit from skilled therapy to improve her ability to use her dominant hand.  OBJECTIVE IMPAIRMENTS: decreased activity tolerance, decreased ROM, decreased strength, impaired UE functional use, postural dysfunction, and pain.   ACTIVITY LIMITATIONS: carrying, lifting, sleeping, self feeding, and reach over head  PARTICIPATION LIMITATIONS: meal prep, cleaning, laundry, shopping, community activity, and occupation  PERSONAL FACTORS: None    REHAB POTENTIAL: Good  CLINICAL DECISION MAKING: Evolving/moderate complexity  EVALUATION COMPLEXITY: Low  GOALS: Goals reviewed with patient? Yes  SHORT TERM GOALS: Target date: 03/03/2022    Will increase grip strength by 10 pounds on the left Baseline: Goal status: INITIAL  2.  Patient will reach overhead with a 50% reduction in numbness and tingling Baseline:  Goal status: INITIAL  3.  Patient will be independent with basic stretching and exercise program Baseline:    LONG TERM GOALS: Target date: 03/24/2022    Patient will sleep through the night without pain or numbness Baseline:  Goal status: INITIAL  2.  Patient will reach  overhead shelf and grab objects without increased numbness Baseline:  Goal status: INITIAL  3.  Patient will increase left grip strength to 40 pounds in order to perform ADLs and IADLs Baseline:  Goal status: INITIAL   PLAN: PT FREQUENCY: 1-2x/week  PT DURATION: 6 weeks  PLANNED INTERVENTIONS: Therapeutic exercises, Therapeutic activity, Neuromuscular re-education, Patient/Family education, Self Care, Joint mobilization, Aquatic Therapy, Dry Needling, Cryotherapy, Moist heat, Taping, Ultrasound, and Manual therapy  PLAN FOR NEXT SESSION: consider needling of the pec; consider needling of the upper trap; review light pec stretching. Consider posterior chain strengthening    Carney Living, PT 02/10/2022, 3:24 PM

## 2022-02-11 ENCOUNTER — Encounter (HOSPITAL_BASED_OUTPATIENT_CLINIC_OR_DEPARTMENT_OTHER): Payer: Self-pay | Admitting: Physical Therapy

## 2022-02-11 ENCOUNTER — Ambulatory Visit (HOSPITAL_BASED_OUTPATIENT_CLINIC_OR_DEPARTMENT_OTHER): Payer: 59 | Admitting: Physical Therapy

## 2022-02-11 DIAGNOSIS — M25512 Pain in left shoulder: Secondary | ICD-10-CM

## 2022-02-11 DIAGNOSIS — G540 Brachial plexus disorders: Secondary | ICD-10-CM | POA: Diagnosis not present

## 2022-02-11 DIAGNOSIS — R531 Weakness: Secondary | ICD-10-CM

## 2022-02-11 NOTE — Therapy (Signed)
OUTPATIENT PHYSICAL THERAPY TREATMENT   Patient Name: Betty Leonard MRN: [id number] DOB:1980-01-21, 42 y.o., female Today's Date: 02/11/2022  END OF SESSION:  PT End of Session - 02/11/22 1157     Visit Number 2    Number of Visits 12    Date for PT Re-Evaluation 03/24/22    PT Start Time 4287    PT Stop Time 6811    PT Time Calculation (min) 40 min    Activity Tolerance Patient tolerated treatment well    Behavior During Therapy [facility] for tasks assessed/performed             Past Medical History:  Diagnosis Date   Anemia    iron infusion sept 2020   Chicken pox    Depression    Esophagitis 12/24/2011   GERD (gastroesophageal reflux disease)    History of wheezing 06/06/2018   Menorrhagia    with irregular cycles   Migraines    Pneumonia 07/2018   Seasonal allergies    Sinusitis 11/12/2021   UTI (urinary tract infection) finished antibiotic 04-11-2019   Past Surgical History:  Procedure Laterality Date   ESOPHAGOGASTRODUODENOSCOPY  12/24/2011   Procedure: ESOPHAGOGASTRODUODENOSCOPY (EGD);  Surgeon: Inda Castle, MD;  Location: [facility];  Service: Endoscopy;  Laterality: N/A;   FOOT SURGERY Right 2009   repair remotely after a fracture   FOOT SURGERY Right 1'2020   HYSTEROSCOPY WITH NOVASURE N/A 04/25/2019   Procedure: HYSTEROSCOPY WITH NOVASURE ABLATION;  Surgeon: Megan Salon, MD;  Location: [facility];  Service: Gynecology;  Laterality: N/A;   KNEE ARTHROSCOPY Left 2018   Patient Active Problem List   Diagnosis Date Noted   Injury of left hand 02/02/2022   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 Deatra Ina and GI 12/16/2011    PCP: Emeterio Reeve Early   REFERRING PROVIDER: Dr Vanetta Mulders   REFERRING DIAG:G54.0 (ICD-10-CM) - Thoracic outlet syndrome    THERAPY DIAG:  Acute pain of left shoulder  Weakness  Rationale for Evaluation and Treatment: Rehabilitation  ONSET DATE:   SUBJECTIVE:  SUBJECTIVE STATEMENT: Pt states her arm gets aggravated with all activity. Reports no issues with her exercises except "the wall one" where she has to rotate in.  PERTINENT HISTORY: Migrans; anemia   PAIN:  Are you having pain? Yes: NPRS scale: 3/10 worst  can reach a 10/10  Pain location: starts int he hand and can go up  Pain description: aching/ numbness/  Aggravating factors: reaching over head and night time  Relieving factors: very hot shower   PRECAUTIONS: Charo Malformation   WEIGHT BEARING RESTRICTIONS: No  FALLS:  Has patient fallen in last 6 months? No  LIVING ENVIRONMENT:  OCCUPATION: Accountant   Recreation: Nothing particular    PLOF: Independent  PATIENT GOALS: Less pain   NEXT MD VISIT:   OBJECTIVE:   DIAGNOSTIC FINDINGS:  IMPRESSION: 1. Small central disc protrusion with uncovertebral spurring at C6-7 with resultant mild spinal stenosis, with mild to moderate left C7 foraminal narrowing. 2. Mild disc bulging with tiny central disc protrusion at C5-6 without significant stenosis or impingement. 3. 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 No syrinx.  PATIENT SURVEYS :  FOTO 64; predicted 64  COGNITION: Overall  cognitive status: Within functional limits for tasks assessed     SENSATION: Numbness into the hand and shooting pain down the arm  POSTURE: Rounded shoulder and forward head   UPPER EXTREMITY ROM:   Active ROM Right eval Left eval  Shoulder flexion  Causes numbness  Shoulder extension    Shoulder abduction    Shoulder adduction    Shoulder internal rotation  Causes numbness   Shoulder external rotation  Causes numbness    Elbow flexion    Elbow extension    Wrist flexion    Wrist extension    Wrist ulnar deviation    Wrist radial deviation    Wrist pronation    Wrist supination    (Blank rows = not tested)  Grip:  L 5 lbs  R 40 lbs    UPPER EXTREMITY MMT:  MMT Right eval Left eval  Shoulder flexion 5 4  Shoulder extension    Shoulder abduction 5 4  Shoulder adduction    Shoulder internal rotation 5 4  Shoulder external rotation    Middle trapezius    Lower trapezius    Elbow flexion    Elbow extension    Wrist flexion    Wrist extension    Wrist ulnar deviation    Wrist radial deviation    Wrist pronation    Wrist supination    Grip strength (lbs)    (Blank rows = not tested)  SHOULDER SPECIAL TESTS: did not assess today 1/24   JOINT MOBILITY TESTING: did not assess today 1/24   PALPATION: TTP pec minor, major, deltoid 1/24    TODAY'S TREATMENT:  DATE:  02/11/22 THEREX Doorway pec stretch low, mid, high 2x30 sec each Shoulder ER red TB 2x10 "W" red TB 2x10 Mid row red TB 2x10x3 sec Crossbody stretch for deltoid x30 sec  MANUAL THERAPY STM & TPR pec minor, major, deltoid Skilled assessment and palpation for TPDN Trigger Point Dry-Needling  Treatment instructions: Expect mild to moderate muscle soreness. S/S of pneumothorax if dry needled over a lung field, and to seek immediate medical attention should  they occur. Patient verbalized understanding of these instructions and education.  Patient Consent Given: Yes Education handout provided: Yes Muscles treated: L pec minor, major, mid deltoid Electrical stimulation performed: No Parameters: N/A Treatment response/outcome: Twitch response, decrease in pain  SELF CARE Discussed work/desk ergonomics and shoulder posture   PATIENT EDUCATION: Education details: HEP modifications, TPDN, work Nurse, mental health educated: Patient Education method: Consulting civil engineer, Media planner, Corporate treasurer cues, Verbal cues, and Handouts Education comprehension: verbalized understanding, returned demonstration, verbal cues required, tactile cues required, and needs further education  HOME EXERCISE PROGRAM: Access Code: [email or web address] URL: [email or web address] Date: 02/11/2022 Prepared by: Estill Bamberg April Thurnell Garbe  Exercises - Doorway Pec Stretch at 60 Elevation  - 1 x daily - 7 x weekly - 2 sets - 30 sec hold - Doorway Pec Stretch at 90 Degrees Abduction  - 1 x daily - 7 x weekly - 2 sets - 30 sec hold - Doorway Pec Stretch at 120 Degrees Abduction  - 1 x daily - 7 x weekly - 2 sets - 30 sec hold - Shoulder External Rotation and Scapular Retraction with Resistance  - 1 x daily - 7 x weekly - 2 sets - 10 reps - 3 sec hold - Shoulder W - External Rotation with Resistance  - 1 x daily - 7 x weekly - 2 sets - 10 reps - 3 sec hold - Standing Shoulder Row with Anchored Resistance  - 1 x daily - 7 x weekly - 2 sets - 10 reps - 3 sec hold  Patient Education - Trigger Point Dry Needling - Office Posture  ASSESSMENT:  CLINICAL IMPRESSION: Treatment focused on TPDN to reduce pec minor/major tightness affecting thoracic outlet. Found mid deltoid tightness after needling pecs which was also addressed with TPDN. Initiated postural/scapular stabilization exercises today with good pt tolerance. Reports improved pain by end of session. Discussed office  posture/ergonomics as well as shoulder posture.   OBJECTIVE IMPAIRMENTS: decreased activity tolerance, decreased ROM, decreased strength, impaired UE functional use, postural dysfunction, and pain.    GOALS: Goals reviewed with patient? Yes  SHORT TERM GOALS: Target date: 03/03/2022    Will increase grip strength by 10 pounds on the left Baseline: Goal status: INITIAL  2.  Patient will reach overhead with a 50% reduction in numbness and tingling Baseline:  Goal status: INITIAL  3.  Patient will be independent with basic stretching and exercise program Baseline:    LONG TERM GOALS: Target date: 03/24/2022    Patient will sleep through the night without pain or numbness Baseline:  Goal status: INITIAL  2.  Patient will reach overhead shelf and grab objects without increased numbness Baseline:  Goal status: INITIAL  3.  Patient will increase left grip strength to 40 pounds in order to perform ADLs and IADLs Baseline:  Goal status: INITIAL   PLAN: PT FREQUENCY: 1-2x/week  PT DURATION: 6 weeks  PLANNED INTERVENTIONS: Therapeutic exercises, Therapeutic activity, Neuromuscular re-education, Patient/Family education, Self Care, Joint mobilization, Aquatic Therapy, Dry Needling, Cryotherapy, Moist heat, Taping, Ultrasound, and Manual therapy  PLAN FOR NEXT SESSION: consider needling of the pec, deltoid, UT. Continue pec stretching as tolerated. Continue posterior chain strengthening    Aarohi Redditt April Ma L Ozzie Knobel, PT 02/11/2022, 12:50 PM

## 2022-02-12 ENCOUNTER — Ambulatory Visit (HOSPITAL_BASED_OUTPATIENT_CLINIC_OR_DEPARTMENT_OTHER): Payer: 59 | Admitting: Physical Therapy

## 2022-02-12 ENCOUNTER — Encounter (HOSPITAL_BASED_OUTPATIENT_CLINIC_OR_DEPARTMENT_OTHER): Payer: Self-pay | Admitting: Physical Therapy

## 2022-02-12 DIAGNOSIS — G540 Brachial plexus disorders: Secondary | ICD-10-CM | POA: Diagnosis not present

## 2022-02-12 DIAGNOSIS — R531 Weakness: Secondary | ICD-10-CM

## 2022-02-12 DIAGNOSIS — M25512 Pain in left shoulder: Secondary | ICD-10-CM

## 2022-02-12 NOTE — Therapy (Signed)
OUTPATIENT PHYSICAL THERAPY TREATMENT   Patient Name: Betty Leonard MRN: [id number] DOB:1980-10-16, 42 y.o., female Today's Date: 02/11/2022  END OF SESSION:  PT End of Session - 02/11/22 1157     Visit Number 2    Number of Visits 12    Date for PT Re-Evaluation 03/24/22    PT Start Time 1552    PT Stop Time 0802    PT Time Calculation (min) 40 min    Activity Tolerance Patient tolerated treatment well    Behavior During Therapy [facility] for tasks assessed/performed             Past Medical History:  Diagnosis Date   Anemia    iron infusion sept 2020   Chicken pox    Depression    Esophagitis 12/24/2011   GERD (gastroesophageal reflux disease)    History of wheezing 06/06/2018   Menorrhagia    with irregular cycles   Migraines    Pneumonia 07/2018   Seasonal allergies    Sinusitis 11/12/2021   UTI (urinary tract infection) finished antibiotic 04-11-2019   Past Surgical History:  Procedure Laterality Date   ESOPHAGOGASTRODUODENOSCOPY  12/24/2011   Procedure: ESOPHAGOGASTRODUODENOSCOPY (EGD);  Surgeon: Inda Castle, MD;  Location: [facility];  Service: Endoscopy;  Laterality: N/A;   FOOT SURGERY Right 2009   repair remotely after a fracture   FOOT SURGERY Right 1'2020   HYSTEROSCOPY WITH NOVASURE N/A 04/25/2019   Procedure: HYSTEROSCOPY WITH NOVASURE ABLATION;  Surgeon: Megan Salon, MD;  Location: [facility];  Service: Gynecology;  Laterality: N/A;   KNEE ARTHROSCOPY Left 2018   Patient Active Problem List   Diagnosis Date Noted   Injury of left hand 02/02/2022   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 Deatra Ina and GI 12/16/2011    PCP: Emeterio Reeve Early   REFERRING PROVIDER: Dr Vanetta Mulders   REFERRING DIAG:G54.0 (ICD-10-CM) - Thoracic outlet syndrome    THERAPY DIAG:  Acute pain of left shoulder  Weakness  Rationale for Evaluation and Treatment: Rehabilitation  ONSET DATE:   SUBJECTIVE:  SUBJECTIVE STATEMENT: The patient reports she has had significant pain in her pec. She reports it is like an intense burning. She reported she could feel it in her chest wall but then reported it was more in the muscle. See clinical impression statement.  PERTINENT HISTORY: Migrans; anemia   PAIN:  Are you having pain? Yes: NPRS scale: 10/10 Pain location: starts int he hand and can go up  Pain description: aching/ numbness/ burning  Aggravating factors: all the time since last night  Relieving factors: very hot shower   PRECAUTIONS: Charo Malformation   WEIGHT BEARING RESTRICTIONS: No  FALLS:  Has patient fallen in last 6 months? No  LIVING ENVIRONMENT:  OCCUPATION: Accountant   Recreation: Nothing particular    PLOF: Independent  PATIENT GOALS: Less pain   NEXT MD VISIT:   OBJECTIVE:   DIAGNOSTIC FINDINGS:  IMPRESSION: 1. Small central disc protrusion with uncovertebral spurring at C6-7 with resultant mild spinal stenosis, with mild to moderate left C7 foraminal narrowing. 2. Mild disc bulging with tiny central disc protrusion at C5-6 without significant stenosis or impingement. 3. 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 No  syrinx.  PATIENT SURVEYS :  FOTO 64; predicted 57  COGNITION: Overall cognitive status: Within functional limits for tasks assessed     SENSATION: Numbness into the hand and shooting pain down the arm  POSTURE: Rounded shoulder and forward head   UPPER EXTREMITY ROM:   Active ROM Right eval Left eval  Shoulder flexion  Causes numbness  Shoulder extension    Shoulder abduction    Shoulder adduction    Shoulder internal rotation  Causes numbness   Shoulder external rotation  Causes numbness    Elbow flexion    Elbow extension    Wrist flexion    Wrist extension    Wrist ulnar deviation    Wrist radial deviation    Wrist pronation    Wrist supination    (Blank rows = not tested)  Grip:  L 5 lbs  R 40 lbs    UPPER EXTREMITY MMT:  MMT Right eval Left eval  Shoulder flexion 5 4  Shoulder extension    Shoulder abduction 5 4  Shoulder adduction    Shoulder internal rotation 5 4  Shoulder external rotation    Middle trapezius    Lower trapezius    Elbow flexion    Elbow extension    Wrist flexion    Wrist extension    Wrist ulnar deviation    Wrist radial deviation    Wrist pronation    Wrist supination    Grip strength (lbs)    (Blank rows = not tested)  SHOULDER SPECIAL TESTS: did not assess today 1/24   JOINT MOBILITY TESTING: did not assess today 1/24   PALPATION: TTP pec minor, major, deltoid 1/24    TODAY'S TREATMENT:  DATE:  1/25 Hot pack and e-stim in supine position with shoulder supported   Manual: trigger point release to pec tendon; trigger point release to upper trap and posterior musculature.   02/11/22 THEREX Doorway pec stretch low, mid, high 2x30 sec each Shoulder ER red TB 2x10 "W" red TB 2x10 Mid row red TB 2x10x3 sec Crossbody stretch for deltoid x30 sec  MANUAL THERAPY STM & TPR pec  minor, major, deltoid Skilled assessment and palpation for TPDN Trigger Point Dry-Needling  Treatment instructions: Expect mild to moderate muscle soreness. S/S of pneumothorax if dry needled over a lung field, and to seek immediate medical attention should they occur. Patient verbalized understanding of these instructions and education.  Patient Consent Given: Yes Education handout provided: Yes Muscles treated: L pec minor, major, mid deltoid Electrical stimulation performed: No Parameters: N/A Treatment response/outcome: Twitch response, decrease in pain  SELF CARE Discussed work/desk ergonomics and shoulder posture   PATIENT EDUCATION: Education details: HEP modifications, TPDN, work Nurse, mental health educated: Patient Education method: Consulting civil engineer, Media planner, Corporate treasurer cues, Verbal cues, and Handouts Education comprehension: verbalized understanding, returned demonstration, verbal cues required, tactile cues required, and needs further education  HOME EXERCISE PROGRAM: Access Code: [email or web address] URL: [email or web address] Date: 02/11/2022 Prepared by: Estill Bamberg April Thurnell Garbe  Exercises - Doorway Pec Stretch at 60 Elevation  - 1 x daily - 7 x weekly - 2 sets - 30 sec hold - Doorway Pec Stretch at 90 Degrees Abduction  - 1 x daily - 7 x weekly - 2 sets - 30 sec hold - Doorway Pec Stretch at 120 Degrees Abduction  - 1 x daily - 7 x weekly - 2 sets - 30 sec hold - Shoulder External Rotation and Scapular Retraction with Resistance  - 1 x daily - 7 x weekly - 2 sets - 10 reps - 3 sec hold - Shoulder W - External Rotation with Resistance  - 1 x daily - 7 x weekly - 2 sets - 10 reps - 3 sec hold - Standing Shoulder Row with Anchored Resistance  - 1 x daily - 7 x weekly - 2 sets - 10 reps - 3 sec hold  Patient Education - Trigger Point Dry Needling - Office Posture  ASSESSMENT:  CLINICAL IMPRESSION: The patient came in today following her appointment yesterday. She  was advised if she has any pain breathing or continued SOB she should seek and urgent care or ED. She reports the pain is not at that level. We performed light stretching of the shoulder and trigger point release which significantly reduce her pain and tightness. She was advised not to keep her arm in a tight sling position all the time. We performed a trial of e-stim but it did not seem to help much.   OBJECTIVE IMPAIRMENTS: decreased activity tolerance, decreased ROM, decreased strength, impaired UE functional use, postural dysfunction, and pain.    GOALS: Goals reviewed with patient? Yes  SHORT TERM GOALS: Target date: 03/03/2022    Will increase grip strength by 10 pounds on the left Baseline: Goal status: INITIAL  2.  Patient will reach overhead with a 50% reduction in numbness and tingling Baseline:  Goal status: INITIAL  3.  Patient will be independent with basic stretching and exercise program Baseline:    LONG TERM GOALS: Target date: 03/24/2022    Patient will sleep through the night without pain or numbness Baseline:  Goal status: INITIAL  2.  Patient will reach overhead shelf and grab objects without increased  numbness Baseline:  Goal status: INITIAL  3.  Patient will increase left grip strength to 40 pounds in order to perform ADLs and IADLs Baseline:  Goal status: INITIAL   PLAN: PT FREQUENCY: 1-2x/week  PT DURATION: 6 weeks  PLANNED INTERVENTIONS: Therapeutic exercises, Therapeutic activity, Neuromuscular re-education, Patient/Family education, Self Care, Joint mobilization, Aquatic Therapy, Dry Needling, Cryotherapy, Moist heat, Taping, Ultrasound, and Manual therapy  PLAN FOR NEXT SESSION: consider needling of the pec, deltoid, UT. Continue pec stretching as tolerated. Continue posterior chain strengthening    Carolyne Littles PT DPT  02/11/2022, 12:50 PM

## 2022-02-18 ENCOUNTER — Encounter (HOSPITAL_BASED_OUTPATIENT_CLINIC_OR_DEPARTMENT_OTHER): Payer: Self-pay | Admitting: Physical Therapy

## 2022-02-18 ENCOUNTER — Ambulatory Visit (HOSPITAL_BASED_OUTPATIENT_CLINIC_OR_DEPARTMENT_OTHER): Payer: 59 | Admitting: Physical Therapy

## 2022-02-18 DIAGNOSIS — M25512 Pain in left shoulder: Secondary | ICD-10-CM

## 2022-02-18 DIAGNOSIS — G540 Brachial plexus disorders: Secondary | ICD-10-CM | POA: Diagnosis not present

## 2022-02-18 DIAGNOSIS — R531 Weakness: Secondary | ICD-10-CM

## 2022-02-18 NOTE — Therapy (Signed)
OUTPATIENT PHYSICAL THERAPY TREATMENT   Patient Name: Betty Leonard MRN: [id number] DOB:July 25, 1980, 42 y.o., female Today's Date: 02/18/2022  END OF SESSION:  PT End of Session - 02/18/22 1630     Visit Number 4    Number of Visits 12    Date for PT Re-Evaluation 03/24/22    PT Start Time 1600    PT Stop Time 1642    PT Time Calculation (min) 42 min    Activity Tolerance Patient tolerated treatment well    Behavior During Therapy [facility] for tasks assessed/performed             Past Medical History:  Diagnosis Date   Anemia    iron infusion sept 2020   Chicken pox    Depression    Esophagitis 12/24/2011   GERD (gastroesophageal reflux disease)    History of wheezing 06/06/2018   Menorrhagia    with irregular cycles   Migraines    Pneumonia 07/2018   Seasonal allergies    Sinusitis 11/12/2021   UTI (urinary tract infection) finished antibiotic 04-11-2019   Past Surgical History:  Procedure Laterality Date   ESOPHAGOGASTRODUODENOSCOPY  12/24/2011   Procedure: ESOPHAGOGASTRODUODENOSCOPY (EGD);  Surgeon: Inda Castle, MD;  Location: [facility];  Service: Endoscopy;  Laterality: N/A;   FOOT SURGERY Right 2009   repair remotely after a fracture   FOOT SURGERY Right 1'2020   HYSTEROSCOPY WITH NOVASURE N/A 04/25/2019   Procedure: HYSTEROSCOPY WITH NOVASURE ABLATION;  Surgeon: Megan Salon, MD;  Location: [facility];  Service: Gynecology;  Laterality: N/A;   KNEE ARTHROSCOPY Left 2018   Patient Active Problem List   Diagnosis Date Noted   Injury of left hand 02/02/2022   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 Deatra Ina and GI 12/16/2011    PCP: Emeterio Reeve Early   REFERRING PROVIDER: Dr Vanetta Mulders   REFERRING DIAG:G54.0 (ICD-10-CM) - Thoracic outlet syndrome    THERAPY DIAG:  Acute pain of left shoulder  Weakness  Rationale for Evaluation and Treatment: Rehabilitation  ONSET DATE:   SUBJECTIVE:  SUBJECTIVE STATEMENT: The patient reports that the numbness has resolved. She is no longer waking up at night. She has improved function but she still feels weakness and pain. She feels like her hand is weak.   PERTINENT HISTORY: Migrans; anemia   PAIN:  Are you having pain? Yes: NPRS scale: 10/10 Pain location: starts int he hand and can go up  Pain description: aching/ numbness/ burning  Aggravating factors: all the time since last night  Relieving factors: very hot shower   PRECAUTIONS: Charo Malformation   WEIGHT BEARING RESTRICTIONS: No  FALLS:  Has patient fallen in last 6 months? No  LIVING ENVIRONMENT:  OCCUPATION: Accountant   Recreation: Nothing particular    PLOF: Independent  PATIENT GOALS: Less pain   NEXT MD VISIT:   OBJECTIVE:   DIAGNOSTIC FINDINGS:  IMPRESSION: 1. Small central disc protrusion with uncovertebral spurring at C6-7 with resultant mild spinal stenosis, with mild to moderate left C7 foraminal narrowing. 2. Mild disc bulging with tiny central disc protrusion at C5-6 without significant stenosis or impingement. 3. 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 No syrinx.  PATIENT SURVEYS :  FOTO 64; predicted  40  COGNITION: Overall cognitive status: Within functional limits for tasks assessed     SENSATION: Numbness into the hand and shooting pain down the arm  POSTURE: Rounded shoulder and forward head   UPPER EXTREMITY ROM:   Active ROM Right eval Left eval  Shoulder flexion  Causes numbness  Shoulder extension    Shoulder abduction    Shoulder adduction    Shoulder internal rotation  Causes numbness   Shoulder external rotation  Causes numbness    Elbow flexion    Elbow extension    Wrist flexion    Wrist extension    Wrist ulnar deviation    Wrist radial deviation    Wrist pronation    Wrist supination    (Blank rows = not tested)  Grip:  L 5 lbs  R 40 lbs    UPPER EXTREMITY MMT:  MMT Right eval Left eval  Shoulder flexion 5 4  Shoulder extension    Shoulder abduction 5 4  Shoulder adduction    Shoulder internal rotation 5 4  Shoulder external rotation    Middle trapezius    Lower trapezius    Elbow flexion    Elbow extension    Wrist flexion    Wrist extension    Wrist ulnar deviation    Wrist radial deviation    Wrist pronation    Wrist supination    Grip strength (lbs)    (Blank rows = not tested)  SHOULDER SPECIAL TESTS: did not assess today 1/24   JOINT MOBILITY TESTING: did not assess today 1/24   PALPATION: TTP pec minor, major, deltoid 1/24    TODAY'S TREATMENT:  DATE:  1/31 Manual: trigger point release to pec tendon; trigger point release to upper trap and posterior musculature.   Wand flexion 2x5 in pain free ranges Scap retraction red x20  Shoulder extension red x20   Putty:  Key grip x20  Gross grip x20  Finger tip grip x20    1/25 Hot pack and e-stim in supine position with shoulder supported   Manual: trigger point release to pec tendon; trigger point release to upper trap and  posterior musculature.   02/11/22 THEREX Doorway pec stretch low, mid, high 2x30 sec each Shoulder ER red TB 2x10 "W" red TB 2x10 Mid row red TB 2x10x3 sec Crossbody stretch for deltoid x30 sec  MANUAL THERAPY STM & TPR pec minor, major, deltoid Skilled assessment and palpation for TPDN Trigger Point Dry-Needling  Treatment instructions: Expect mild to moderate muscle soreness. S/S of pneumothorax if dry needled over a lung field, and to seek immediate medical attention should they occur. Patient verbalized understanding of these instructions and education.  Patient Consent Given: Yes Education handout provided: Yes Muscles treated: L pec minor, major, mid deltoid Electrical stimulation performed: No Parameters: N/A Treatment response/outcome: Twitch response, decrease in pain  SELF CARE Discussed work/desk ergonomics and shoulder posture   PATIENT EDUCATION: Education details: HEP modifications, TPDN, work Nurse, mental health educated: Patient Education method: Consulting civil engineer, Media planner, Corporate treasurer cues, Verbal cues, and Handouts Education comprehension: verbalized understanding, returned demonstration, verbal cues required, tactile cues required, and needs further education  HOME EXERCISE PROGRAM: Access Code: [email or web address] URL: [email or web address] Date: 02/11/2022 Prepared by: Estill Bamberg April Thurnell Garbe  Exercises - Doorway Pec Stretch at 60 Elevation  - 1 x daily - 7 x weekly - 2 sets - 30 sec hold - Doorway Pec Stretch at 90 Degrees Abduction  - 1 x daily - 7 x weekly - 2 sets - 30 sec hold - Doorway Pec Stretch at 120 Degrees Abduction  - 1 x daily - 7 x weekly - 2 sets - 30 sec hold - Shoulder External Rotation and Scapular Retraction with Resistance  - 1 x daily - 7 x weekly - 2 sets - 10 reps - 3 sec hold - Shoulder W - External Rotation with Resistance  - 1 x daily - 7 x weekly - 2 sets - 10 reps - 3 sec hold - Standing Shoulder Row with Anchored Resistance   - 1 x daily - 7 x weekly - 2 sets - 10 reps - 3 sec hold  Patient Education - Trigger Point Dry Needling - Office Posture  ASSESSMENT:  CLINICAL IMPRESSION: The patient reports resolution of the numbness. She is still having some difficulty with functional use of her hand. We gave her putty exercises for hand strengthening. We also expanded her home exercises for posterior chain. We will continue to progress strengthening as tolerated. Her pec is still somewhat spasming. We performed trigger point release to the pec.   OBJECTIVE IMPAIRMENTS: decreased activity tolerance, decreased ROM, decreased strength, impaired UE functional use, postural dysfunction, and pain.    GOALS: Goals reviewed with patient? Yes  SHORT TERM GOALS: Target date: 03/03/2022    Will increase grip strength by 10 pounds on the left Baseline: Goal status: INITIAL  2.  Patient will reach overhead with a 50% reduction in numbness and tingling Baseline:  Goal status: INITIAL  3.  Patient will be independent with basic stretching and exercise program Baseline:    LONG TERM GOALS: Target date: 03/24/2022    Patient will  sleep through the night without pain or numbness Baseline:  Goal status: INITIAL  2.  Patient will reach overhead shelf and grab objects without increased numbness Baseline:  Goal status: INITIAL  3.  Patient will increase left grip strength to 40 pounds in order to perform ADLs and IADLs Baseline:  Goal status: INITIAL   PLAN: PT FREQUENCY: 1-2x/week  PT DURATION: 6 weeks  PLANNED INTERVENTIONS: Therapeutic exercises, Therapeutic activity, Neuromuscular re-education, Patient/Family education, Self Care, Joint mobilization, Aquatic Therapy, Dry Needling, Cryotherapy, Moist heat, Taping, Ultrasound, and Manual therapy  PLAN FOR NEXT SESSION: consider needling of the pec, deltoid, UT. Continue pec stretching as tolerated. Continue posterior chain strengthening    Carolyne Littles PT  DPT  02/18/2022, 4:32 PM

## 2022-02-23 ENCOUNTER — Other Ambulatory Visit (HOSPITAL_BASED_OUTPATIENT_CLINIC_OR_DEPARTMENT_OTHER): Payer: Self-pay

## 2022-02-25 ENCOUNTER — Encounter (HOSPITAL_BASED_OUTPATIENT_CLINIC_OR_DEPARTMENT_OTHER): Payer: Self-pay | Admitting: Physical Therapy

## 2022-02-25 ENCOUNTER — Ambulatory Visit (HOSPITAL_BASED_OUTPATIENT_CLINIC_OR_DEPARTMENT_OTHER): Payer: 59 | Attending: Orthopaedic Surgery | Admitting: Physical Therapy

## 2022-02-25 DIAGNOSIS — M25512 Pain in left shoulder: Secondary | ICD-10-CM

## 2022-02-25 DIAGNOSIS — R531 Weakness: Secondary | ICD-10-CM

## 2022-02-25 NOTE — Therapy (Unsigned)
OUTPATIENT PHYSICAL THERAPY TREATMENT   Patient Name: Betty Leonard MRN: [id number] DOB:03-Nov-1980, 42 y.o., female Today's Date: 02/26/2022  END OF SESSION:  PT End of Session - 02/25/22 1544     Visit Number 5    Number of Visits 12    Date for PT Re-Evaluation 03/24/22    PT Start Time 1300    PT Stop Time [id number]    PT Time Calculation (min) 43 min    Activity Tolerance Patient tolerated treatment well    Behavior During Therapy [facility] for tasks assessed/performed              Past Medical History:  Diagnosis Date   Anemia    iron infusion sept 2020   Chicken pox    Depression    Esophagitis 12/24/2011   GERD (gastroesophageal reflux disease)    History of wheezing 06/06/2018   Menorrhagia    with irregular cycles   Migraines    Pneumonia 07/2018   Seasonal allergies    Sinusitis 11/12/2021   UTI (urinary tract infection) finished antibiotic 04-11-2019   Past Surgical History:  Procedure Laterality Date   ESOPHAGOGASTRODUODENOSCOPY  12/24/2011   Procedure: ESOPHAGOGASTRODUODENOSCOPY (EGD);  Surgeon: Inda Castle, MD;  Location: [facility];  Service: Endoscopy;  Laterality: N/A;   FOOT SURGERY Right 2009   repair remotely after a fracture   FOOT SURGERY Right 1'2020   HYSTEROSCOPY WITH NOVASURE N/A 04/25/2019   Procedure: HYSTEROSCOPY WITH NOVASURE ABLATION;  Surgeon: Megan Salon, MD;  Location: 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 Deatra Ina and GI 12/16/2011    PCP: Emeterio Reeve Early   REFERRING PROVIDER: Dr Vanetta Mulders   REFERRING DIAG:G54.0 (ICD-10-CM) - Thoracic outlet syndrome    THERAPY DIAG:  Acute pain of left shoulder  Weakness  Rationale for Evaluation and Treatment: Rehabilitation  ONSET DATE:   SUBJECTIVE:  SUBJECTIVE STATEMENT: The patient reports that the numbness in her hand is back. She is also having significant pain in her hypothenar area.  PERTINENT HISTORY: Migrans; anemia   PAIN:  Are you having pain? Yes: NPRS scale: 5-6 /10 Pain location: starts int he hand and can go up  Pain description: aching/ numbness/ burning  Aggravating factors: all the time since last night  Relieving factors: very hot shower   PRECAUTIONS: Charo Malformation   WEIGHT BEARING RESTRICTIONS: No  FALLS:  Has patient fallen in last 6 months? No  LIVING ENVIRONMENT:  OCCUPATION: Accountant   Recreation: Nothing particular    PLOF: Independent  PATIENT GOALS: Less pain   NEXT MD VISIT:   OBJECTIVE:   DIAGNOSTIC FINDINGS:  IMPRESSION: 1. Small central disc protrusion with uncovertebral spurring at C6-7 with resultant mild spinal stenosis, with mild to moderate left C7 foraminal narrowing. 2. Mild disc bulging with tiny central disc protrusion at C5-6 without significant stenosis or impingement. 3. 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 No syrinx.  PATIENT SURVEYS :  FOTO 64; predicted 74  COGNITION: Overall cognitive status: Within functional  limits for tasks assessed     SENSATION: Numbness into the hand and shooting pain down the arm  POSTURE: Rounded shoulder and forward head   UPPER EXTREMITY ROM:   Active ROM Right eval Left eval  Shoulder flexion  Causes numbness  Shoulder extension    Shoulder abduction    Shoulder adduction    Shoulder internal rotation  Causes numbness   Shoulder external rotation  Causes numbness    Elbow flexion    Elbow extension    Wrist flexion    Wrist extension    Wrist ulnar deviation    Wrist radial deviation    Wrist pronation    Wrist supination    (Blank rows = not tested)  Grip:  L 5 lbs  R 40 lbs    UPPER EXTREMITY MMT:  MMT Right eval Left eval  Shoulder flexion 5 4  Shoulder extension    Shoulder abduction 5 4  Shoulder adduction    Shoulder internal rotation 5 4  Shoulder external rotation    Middle trapezius    Lower trapezius    Elbow flexion    Elbow extension    Wrist flexion    Wrist extension    Wrist ulnar deviation    Wrist radial deviation    Wrist pronation    Wrist supination    Grip strength (lbs)    (Blank rows = not tested)  SHOULDER SPECIAL TESTS: did not assess today 1/24   JOINT MOBILITY TESTING: did not assess today 1/24   PALPATION: TTP pec minor, major, deltoid 1/24    TODAY'S TREATMENT:  DATE:  2/7 Manual: trigger point release to pec tendon; trigger point release to upper trap and posterior musculature.   Wand flexion 2x5 in pain free ranges Scap retraction red x20  Shoulder extension red x20   Trigger Point Dry-Needling  Treatment instructions: Expect mild to moderate muscle soreness. S/S of pneumothorax if dry needled over a lung field, and to seek immediate medical attention should they occur. Patient verbalized understanding of these instructions and education.  Patient  Consent Given: Yes Education handout provided: Yes Muscles treated: hypothenar on the left 3x with a .25x30 needle  Electrical stimulation performed: No Parameters: N/A Treatment response/outcome: great twitch   Reviewed self soft tissue   Manual: trigger point release to pec tendon; trigger point release to upper trap and posterior musculature. PROM of shoulder and abduction   Chest press 2x15  Pulleys 2 min  Row 2x15 yellow       1/31 Manual: trigger point release to pec tendon; trigger point release to upper trap and posterior musculature.   Wand flexion 2x5 in pain free ranges Scap retraction red x20  Shoulder extension red x20   Putty:  Key grip x20  Gross grip x20  Finger tip grip x20    1/25 Hot pack and e-stim in supine position with shoulder supported   Manual: trigger point release to pec tendon; trigger point release to upper trap and posterior musculature.   02/11/22 THEREX Doorway pec stretch low, mid, high 2x30 sec each Shoulder ER red TB 2x10 "W" red TB 2x10 Mid row red TB 2x10x3 sec Crossbody stretch for deltoid x30 sec  MANUAL THERAPY STM & TPR pec minor, major, deltoid Skilled assessment and palpation for TPDN Trigger Point Dry-Needling  Treatment instructions: Expect mild to moderate muscle soreness. S/S of pneumothorax if dry needled over a lung field, and to seek immediate medical attention should they occur. Patient verbalized understanding of these instructions and education.  Patient Consent Given: Yes Education handout provided: Yes Muscles treated: L pec minor, major, mid deltoid Electrical stimulation performed: No Parameters: N/A Treatment response/outcome: Twitch response, decrease in pain  SELF CARE Discussed work/desk ergonomics and shoulder posture   PATIENT EDUCATION: Education details: HEP modifications, TPDN, work Nurse, mental health educated: Patient Education method: Consulting civil engineer, Media planner, Corporate treasurer cues, Verbal  cues, and Handouts Education comprehension: verbalized understanding, returned demonstration, verbal cues required, tactile cues required, and needs further education  HOME EXERCISE PROGRAM: Access Code: [email or web address] URL: [email or web address] Date: 02/11/2022 Prepared by: Estill Bamberg April Thurnell Garbe  Exercises - Doorway Pec Stretch at 60 Elevation  - 1 x daily - 7 x weekly - 2 sets - 30 sec hold - Doorway Pec Stretch at 90 Degrees Abduction  - 1 x daily - 7 x weekly - 2 sets - 30 sec hold - Doorway Pec Stretch at 120 Degrees Abduction  - 1 x daily - 7 x weekly - 2 sets - 30 sec hold - Shoulder External Rotation and Scapular Retraction with Resistance  - 1 x daily - 7 x weekly - 2 sets - 10 reps - 3 sec hold - Shoulder W - External Rotation with Resistance  - 1 x daily - 7 x weekly - 2 sets - 10 reps - 3 sec hold - Standing Shoulder Row with Anchored Resistance  - 1 x daily - 7 x weekly - 2 sets - 10 reps - 3 sec hold  Patient Education - Trigger Point Dry Needling - Office Posture  ASSESSMENT:  CLINICAL IMPRESSION: The patient has  been having some difficulty with the putty. She was advised to try to find something softer like playdough. We performed needling to her hypothenar area. She tolerated better then the pec. We continue to work on roving strength. She reports her arm feels heavy. Overall the patient appears to be improving slowly. She was advised to keep in mind this is only her third treatment and it will take some time. We reviewed the exercises to continue at home.   OBJECTIVE IMPAIRMENTS: decreased activity tolerance, decreased ROM, decreased strength, impaired UE functional use, postural dysfunction, and pain.    GOALS: Goals reviewed with patient? Yes  SHORT TERM GOALS: Target date: 03/03/2022    Will increase grip strength by 10 pounds on the left Baseline: Goal status: INITIAL  2.  Patient will reach overhead with a 50% reduction in numbness and  tingling Baseline:  Goal status: INITIAL  3.  Patient will be independent with basic stretching and exercise program Baseline:    LONG TERM GOALS: Target date: 03/24/2022    Patient will sleep through the night without pain or numbness Baseline:  Goal status: INITIAL  2.  Patient will reach overhead shelf and grab objects without increased numbness Baseline:  Goal status: INITIAL  3.  Patient will increase left grip strength to 40 pounds in order to perform ADLs and IADLs Baseline:  Goal status: INITIAL   PLAN: PT FREQUENCY: 1-2x/week  PT DURATION: 6 weeks  PLANNED INTERVENTIONS: Therapeutic exercises, Therapeutic activity, Neuromuscular re-education, Patient/Family education, Self Care, Joint mobilization, Aquatic Therapy, Dry Needling, Cryotherapy, Moist heat, Taping, Ultrasound, and Manual therapy  PLAN FOR NEXT SESSION: consider needling of the pec, deltoid, UT. Continue pec stretching as tolerated. Continue posterior chain strengthening    Carolyne Littles PT DPT  02/26/2022, 9:52 AM

## 2022-02-26 ENCOUNTER — Encounter (HOSPITAL_BASED_OUTPATIENT_CLINIC_OR_DEPARTMENT_OTHER): Payer: Self-pay | Admitting: Physical Therapy

## 2022-02-27 ENCOUNTER — Ambulatory Visit (HOSPITAL_BASED_OUTPATIENT_CLINIC_OR_DEPARTMENT_OTHER): Payer: 59 | Admitting: Orthopaedic Surgery

## 2022-03-04 ENCOUNTER — Encounter: Payer: Self-pay | Admitting: Family Medicine

## 2022-03-04 ENCOUNTER — Ambulatory Visit (HOSPITAL_BASED_OUTPATIENT_CLINIC_OR_DEPARTMENT_OTHER): Payer: 59 | Admitting: Physical Therapy

## 2022-03-04 ENCOUNTER — Ambulatory Visit (INDEPENDENT_AMBULATORY_CARE_PROVIDER_SITE_OTHER): Payer: 59 | Admitting: Family Medicine

## 2022-03-04 ENCOUNTER — Encounter (HOSPITAL_BASED_OUTPATIENT_CLINIC_OR_DEPARTMENT_OTHER): Payer: Self-pay | Admitting: Physical Therapy

## 2022-03-04 VITALS — BP 100/60 | HR 72 | Temp 98.1°F | Ht 66.0 in | Wt 235.0 lb

## 2022-03-04 DIAGNOSIS — R531 Weakness: Secondary | ICD-10-CM

## 2022-03-04 DIAGNOSIS — N393 Stress incontinence (female) (male): Secondary | ICD-10-CM

## 2022-03-04 DIAGNOSIS — J309 Allergic rhinitis, unspecified: Secondary | ICD-10-CM | POA: Diagnosis not present

## 2022-03-04 DIAGNOSIS — M25512 Pain in left shoulder: Secondary | ICD-10-CM | POA: Diagnosis not present

## 2022-03-04 DIAGNOSIS — R300 Dysuria: Secondary | ICD-10-CM

## 2022-03-04 DIAGNOSIS — R350 Frequency of micturition: Secondary | ICD-10-CM

## 2022-03-04 LAB — POCT URINALYSIS DIP (PROADVANTAGE DEVICE)
Bilirubin, UA: NEGATIVE
Glucose, UA: NEGATIVE mg/dL
Ketones, POC UA: NEGATIVE mg/dL
Leukocytes, UA: NEGATIVE
Nitrite, UA: NEGATIVE
Protein Ur, POC: NEGATIVE mg/dL
Specific Gravity, Urine: 1.025
Urobilinogen, Ur: 0.2
pH, UA: 6 (ref 5.0–8.0)

## 2022-03-04 NOTE — Patient Instructions (Addendum)
Your urine today really wasn't consistent with an infection. We will send it for culture to know for sure. If it shows an infection, we will send in an antibiotic. If it is negative, diagnosis can include overactive bladder, or spasms or potentially interstitial cystitis.  This is usually diagnosed and treated by a urologist. Certain foods and medications can also irritate your bladder.  Possibly the phenylephrine you took last week. Please try cutting back on your caffeine intake.  You did mention some symptoms related to your pelvic floor musculature (the leakage of urine). Doing regular Kegel's exercises might help.  Please drink more water, your urine was very concentrated.  For allergies I recommend an oral antihistamine such as claritin or allegra or zyrtec. If that isn't effective alone, then you can add in an inhaled nasal steroid such as Flonase, nasonex.  Guaifenesin help to keep the mucus or phlegm thin, and dextromethorphan helps with suppressing the cough. The phenylephrine you took is a decongestant--it is short-acting and may be affecting your bladder.  The other medications are better for allergies.

## 2022-03-04 NOTE — Progress Notes (Signed)
Chief Complaint  Patient presents with   Urinary Frequency    Urinary frequency, urgency and burning that stated Sunday.    Feels like her "classic UTI" symptoms--urgency and frequency, dysuria x 3-4 days. No abdominal pain. Flank pain. Denies vaginal discharge, odor, itch. No menses, s/p ablation No new products, no douches. No redness/irritation/rash Not in a sexual relationship, no STD risks.  Reports drinking a LOT of coffee.  No UTI since 2021 She was referred to urologist after having negative cultures by GYN for UTI symptoms. Saw Dr. Noreene Larsson in 09/2019 (not a good visit--felt violated/traumatized by pelvic floor PT, per pt, didn't want to continue) She still leaks with cough/sneezing Can't recall if she took medications (was rx'd myrbetriq).  Her allergies were flaring last week, "not doctor-worthy", mild. She took mucinex fastmax (containing tylenol, dextrometh, guaife, phentermine). Feeling better now.   PMH, PSH, SH reviewed  Prior results/notes reviewed regarding UTI's in 2021.  Outpatient Encounter Medications as of 03/04/2022  Medication Sig Note   sertraline (ZOLOFT) 50 MG tablet Take 1/2 tab by mouth at bedtime for the first 4 days then increase to full tab at bedtime. 03/04/2022: 47m daily   Vitamin D, Ergocalciferol, (DRISDOL) 1.25 MG (50000 UNIT) CAPS capsule Take 1 capsule (50,000 Units total) by mouth every 7 (seven) days. Take for 12 total doses(weeks) than can transition to 1000 units OTC supplement daily    EPINEPHrine 0.3 mg/0.3 mL IJ SOAJ injection Inject 0.3 mg into the muscle as needed for anaphylaxis. (Patient not taking: Reported on 03/04/2022)    hydrOXYzine (ATARAX) 50 MG tablet Take 0.5-1 tablets (25-50 mg total) by mouth at bedtime as needed. For anxiety and sleep. (Patient not taking: Reported on 03/04/2022) 03/04/2022: As needed, rarely   ibuprofen (ADVIL) 800 MG tablet Take 1 tablet (800 mg total) by mouth 3 (three) times daily. (Patient not taking:  Reported on 03/04/2022) 03/04/2022: As needed   No facility-administered encounter medications on file as of 03/04/2022.   Allergies  Allergen Reactions   Codeine Shortness Of Breath   Hydrocodone Shortness Of Breath   Iodine Other (See Comments)    Shellfish allergy   Shellfish Allergy Anaphylaxis   Aspirin Hives     Allergies  Allergen Reactions   Codeine Shortness Of Breath   Hydrocodone Shortness Of Breath   Iodine Other (See Comments)    Shellfish allergy   Shellfish Allergy Anaphylaxis   Aspirin Hives    ROS: No f/c, n/v/d, flank pain, hematuria, no URI symptoms (some allergies last week, better now). No vaginal discharge, odor, itch.  Stress incontinence per HPI, chronic.   PHYSICAL EXAM:  BP 100/60   Pulse 72   Temp 98.1 F (36.7 C) (Tympanic)   Ht 5' 6"$  (1.676 m)   Wt 235 lb (106.6 kg)   BMI 37.93 kg/m   Well-appearing, pleasant female in no distress HEENT: conjunctiva and sclera are clear, EOMI Neck: no lymphadenopathy or mass Heart: regular rate and rhythm Lungs: clear bilaterally Back: no spinal or CVA tenderness Abdomen: Mildly tender in suprapubic area, nontender elsewhere, no organomegaly or mass Extremities: no edema Neuro: alert and oriented, normal gait Psych: normal mood, affect, hygiene and grooming  Urine: SG 1.025, trace blood, negative leuks  ASSESSMENT/PLAN:  Urinary frequency - normal u/a, send for culture.  Cut back on caffeine, limit decongestants, increase fluids. Consider urologist if persistent/worsening sx iwth neg cx - Plan: POCT Urinalysis DIP (Proadvantage Device)  Burning with urination - Plan: POCT Urinalysis DIP (Proadvantage  Device), Urine Culture  Allergic rhinitis, unspecified seasonality, unspecified trigger - meds she took last week are more for colds; reviewed recs for allergies (antihistamines, nasal steroids)  Stress incontinence in female - recommended Kegel's regularly  Urine culture sent, prefers to wait for  results rather than treat presumptively (since appears to have a normal urine dip).  May use AZO for 2 days if needed.  I spent 30 minutes dedicated to the care of this patient, including pre-visit review of records, face to face time, post-visit ordering of testing and documentation.   Your urine today really wasn't consistent with an infection. We will send it for culture to know for sure. If it shows an infection, we will send in an antibiotic. If it is negative, diagnosis can include overactive bladder, or spasms or potentially interstitial cystitis.  This is usually diagnosed and treated by a urologist. Certain foods and medications can also irritate your bladder.  Possibly the phenylephrine you took last week. Please try cutting back on your caffeine intake.  You did mention some symptoms related to your pelvic floor musculature (the leakage of urine). Doing regular Kegel's exercises might help.

## 2022-03-04 NOTE — Therapy (Signed)
OUTPATIENT PHYSICAL THERAPY TREATMENT   Patient Name: Betty Leonard MRN: [id number] DOB:03-Nov-1980, 42 y.o., female Today's Date: 02/26/2022  END OF SESSION:  PT End of Session - 02/25/22 1544     Visit Number 5    Number of Visits 12    Date for PT Re-Evaluation 03/24/22    PT Start Time 1300    PT Stop Time [id number]    PT Time Calculation (min) 43 min    Activity Tolerance Patient tolerated treatment well    Behavior During Therapy [facility] for tasks assessed/performed              Past Medical History:  Diagnosis Date   Anemia    iron infusion sept 2020   Chicken pox    Depression    Esophagitis 12/24/2011   GERD (gastroesophageal reflux disease)    History of wheezing 06/06/2018   Menorrhagia    with irregular cycles   Migraines    Pneumonia 07/2018   Seasonal allergies    Sinusitis 11/12/2021   UTI (urinary tract infection) finished antibiotic 04-11-2019   Past Surgical History:  Procedure Laterality Date   ESOPHAGOGASTRODUODENOSCOPY  12/24/2011   Procedure: ESOPHAGOGASTRODUODENOSCOPY (EGD);  Surgeon: Inda Castle, MD;  Location: [facility];  Service: Endoscopy;  Laterality: N/A;   FOOT SURGERY Right 2009   repair remotely after a fracture   FOOT SURGERY Right 1'2020   HYSTEROSCOPY WITH NOVASURE N/A 04/25/2019   Procedure: HYSTEROSCOPY WITH NOVASURE ABLATION;  Surgeon: Megan Salon, MD;  Location: 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 Deatra Ina and GI 12/16/2011    PCP: Emeterio Reeve Early   REFERRING PROVIDER: Dr Vanetta Mulders   REFERRING DIAG:G54.0 (ICD-10-CM) - Thoracic outlet syndrome    THERAPY DIAG:  Acute pain of left shoulder  Weakness  Rationale for Evaluation and Treatment: Rehabilitation  ONSET DATE:   SUBJECTIVE:  SUBJECTIVE STATEMENT: The patient reports that the numbness in her hand is back. She is also having significant pain in her hypothenar area.  PERTINENT HISTORY: Migrans; anemia   PAIN:  Are you having pain? Yes: NPRS scale: 5-6 /10 Pain location: starts int he hand and can go up  Pain description: aching/ numbness/ burning  Aggravating factors: all the time since last night  Relieving factors: very hot shower   PRECAUTIONS: Charo Malformation   WEIGHT BEARING RESTRICTIONS: No  FALLS:  Has patient fallen in last 6 months? No  LIVING ENVIRONMENT:  OCCUPATION: Accountant   Recreation: Nothing particular    PLOF: Independent  PATIENT GOALS: Less pain   NEXT MD VISIT:   OBJECTIVE:   DIAGNOSTIC FINDINGS:  IMPRESSION: 1. Small central disc protrusion with uncovertebral spurring at C6-7 with resultant mild spinal stenosis, with mild to moderate left C7 foraminal narrowing. 2. Mild disc bulging with tiny central disc protrusion at C5-6 without significant stenosis or impingement. 3. 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 No syrinx.  PATIENT SURVEYS :  FOTO 64; predicted 74  COGNITION: Overall cognitive status: Within functional  limits for tasks assessed     SENSATION: Numbness into the hand and shooting pain down the arm  POSTURE: Rounded shoulder and forward head   UPPER EXTREMITY ROM:   Active ROM Right eval Left eval  Shoulder flexion  Causes numbness  Shoulder extension    Shoulder abduction    Shoulder adduction    Shoulder internal rotation  Causes numbness   Shoulder external rotation  Causes numbness    Elbow flexion    Elbow extension    Wrist flexion    Wrist extension    Wrist ulnar deviation    Wrist radial deviation    Wrist pronation    Wrist supination    (Blank rows = not tested)  Grip:  L 5 lbs  R 40 lbs    UPPER EXTREMITY MMT:  MMT Right eval Left eval  Shoulder flexion 5 4  Shoulder extension    Shoulder abduction 5 4  Shoulder adduction    Shoulder internal rotation 5 4  Shoulder external rotation    Middle trapezius    Lower trapezius    Elbow flexion    Elbow extension    Wrist flexion    Wrist extension    Wrist ulnar deviation    Wrist radial deviation    Wrist pronation    Wrist supination    Grip strength (lbs)    (Blank rows = not tested)  SHOULDER SPECIAL TESTS: did not assess today 1/24   JOINT MOBILITY TESTING: did not assess today 1/24   PALPATION: TTP pec minor, major, deltoid 1/24    TODAY'S TREATMENT:  DATE:  2/14 Manual: trigger point release to pec tendon; trigger point release to upper trap and posterior musculature. Reviewed self soft tissue mobilization using the thera-cane  Wand flexion 2x10 in pain free ranges Scap retraction red x20  Shoulder extension green  x20   Trigger Point Dry-Needling  Treatment instructions: Expect mild to moderate muscle soreness. S/S of pneumothorax if dry needled over a lung field, and to seek immediate medical attention should they occur. Patient verbalized  understanding of these instructions and education.  Patient Consent Given: Yes Education handout provided: Yes Muscles treated: hypothenar on the left 3x with a .25x30 needle  Electrical stimulation performed: No Parameters: N/A Treatment response/outcome: great twitch    2/7 Manual: trigger point release to pec tendon; trigger point release to upper trap and posterior musculature.   Wand flexion 2x5 in pain free ranges Scap retraction red x20  Shoulder extension red x20   Trigger Point Dry-Needling  Treatment instructions: Expect mild to moderate muscle soreness. S/S of pneumothorax if dry needled over a lung field, and to seek immediate medical attention should they occur. Patient verbalized understanding of these instructions and education.  Patient Consent Given: Yes Education handout provided: Yes Muscles treated: hypothenar on the left 3x with a .25x30 needle  Electrical stimulation performed: No Parameters: N/A Treatment response/outcome: great twitch   Reviewed self soft tissue   Manual: trigger point release to pec tendon; trigger point release to upper trap and posterior musculature. PROM of shoulder and abduction   Chest press 2x15  Pulleys 2 min  Row 2x15 yellow          PATIENT EDUCATION: Education details: HEP modifications, TPDN, work Nurse, mental health educated: Patient Education method: Consulting civil engineer, Media planner, Corporate treasurer cues, Verbal cues, and Handouts Education comprehension: verbalized understanding, returned demonstration, verbal cues required, tactile cues required, and needs further education  HOME EXERCISE PROGRAM: Access Code: [email or web address] URL: [email or web address] Date: 02/11/2022 Prepared by: Estill Bamberg April Thurnell Garbe  Exercises - Doorway Pec Stretch at 60 Elevation  - 1 x daily - 7 x weekly - 2 sets - 30 sec hold - Doorway Pec Stretch at 90 Degrees Abduction  - 1 x daily - 7 x weekly - 2 sets - 30 sec hold - Doorway Pec  Stretch at 120 Degrees Abduction  - 1 x daily - 7 x weekly - 2 sets - 30 sec hold - Shoulder External Rotation and Scapular Retraction with Resistance  - 1 x daily - 7 x weekly - 2 sets - 10 reps - 3 sec hold - Shoulder W - External Rotation with Resistance  - 1 x daily - 7 x weekly - 2 sets - 10 reps - 3 sec hold - Standing Shoulder Row with Anchored Resistance  - 1 x daily - 7 x weekly - 2 sets - 10 reps - 3 sec hold  Patient Education - Trigger Point Dry Needling - Office Posture  ASSESSMENT:  CLINICAL IMPRESSION: The patient is making excellent progress. Her grip has improved to 35 pounds on the left. She reported a tight area in her upper trap today. She had mild spasming. We reviewed use of thera-cane for home soft tissue mobilization. It is likely 2nd to increased activity and still guarding. We were able to advance her bands today. We will continue to work on strengthening now that her symptoms are resolving. Her FOTO scores have improved.  OBJECTIVE IMPAIRMENTS: decreased activity tolerance, decreased ROM, decreased strength, impaired UE functional use, postural dysfunction, and pain.  GOALS: Goals reviewed with patient? Yes  SHORT TERM GOALS: Target date: 03/03/2022    Will increase grip strength by 10 pounds on the left Baseline: Goal status: INITIAL  2.  Patient will reach overhead with a 50% reduction in numbness and tingling Baseline:  Goal status: INITIAL  3.  Patient will be independent with basic stretching and exercise program Baseline:    LONG TERM GOALS: Target date: 03/24/2022    Patient will sleep through the night without pain or numbness Baseline:  Goal status: INITIAL  2.  Patient will reach overhead shelf and grab objects without increased numbness Baseline:  Goal status: INITIAL  3.  Patient will increase left grip strength to 40 pounds in order to perform ADLs and IADLs Baseline:  Goal status: INITIAL   PLAN: PT FREQUENCY: 1-2x/week  PT  DURATION: 6 weeks  PLANNED INTERVENTIONS: Therapeutic exercises, Therapeutic activity, Neuromuscular re-education, Patient/Family education, Self Care, Joint mobilization, Aquatic Therapy, Dry Needling, Cryotherapy, Moist heat, Taping, Ultrasound, and Manual therapy  PLAN FOR NEXT SESSION: consider needling of the pec, deltoid, UT. Continue pec stretching as tolerated. Continue posterior chain strengthening    Carolyne Littles PT DPT  02/26/2022, 9:52 AM

## 2022-03-06 LAB — URINE CULTURE

## 2022-03-09 ENCOUNTER — Encounter (HOSPITAL_BASED_OUTPATIENT_CLINIC_OR_DEPARTMENT_OTHER): Payer: Self-pay | Admitting: Physical Therapy

## 2022-03-09 ENCOUNTER — Ambulatory Visit (HOSPITAL_BASED_OUTPATIENT_CLINIC_OR_DEPARTMENT_OTHER): Payer: 59 | Admitting: Physical Therapy

## 2022-03-09 DIAGNOSIS — M25512 Pain in left shoulder: Secondary | ICD-10-CM | POA: Diagnosis not present

## 2022-03-09 DIAGNOSIS — R531 Weakness: Secondary | ICD-10-CM

## 2022-03-09 NOTE — Therapy (Signed)
OUTPATIENT PHYSICAL THERAPY TREATMENT   Patient Name: Betty Leonard MRN: [id number] DOB:06/28/1980, 42 y.o., female Today's Date: 02/26/2022  END OF SESSION:  PT End of Session - 02/25/22 1544     Visit Number 5    Number of Visits 12    Date for PT Re-Evaluation 03/24/22    PT Start Time 1300    PT Stop Time [id number]    PT Time Calculation (min) 43 min    Activity Tolerance Patient tolerated treatment well    Behavior During Therapy [facility] for tasks assessed/performed              Past Medical History:  Diagnosis Date   Anemia    iron infusion sept 2020   Chicken pox    Depression    Esophagitis 12/24/2011   GERD (gastroesophageal reflux disease)    History of wheezing 06/06/2018   Menorrhagia    with irregular cycles   Migraines    Pneumonia 07/2018   Seasonal allergies    Sinusitis 11/12/2021   UTI (urinary tract infection) finished antibiotic 04-11-2019   Past Surgical History:  Procedure Laterality Date   ESOPHAGOGASTRODUODENOSCOPY  12/24/2011   Procedure: ESOPHAGOGASTRODUODENOSCOPY (EGD);  Surgeon: Inda Castle, MD;  Location: [facility];  Service: Endoscopy;  Laterality: N/A;   FOOT SURGERY Right 2009   repair remotely after a fracture   FOOT SURGERY Right 1'2020   HYSTEROSCOPY WITH NOVASURE N/A 04/25/2019   Procedure: HYSTEROSCOPY WITH NOVASURE ABLATION;  Surgeon: Megan Salon, MD;  Location: [facility];  Service: Gynecology;  Laterality: N/A;   KNEE ARTHROSCOPY Left 2018   Patient Active Problem List   Diagnosis Date Noted   Injury of left hand 02/02/2022   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 Deatra Ina and GI 12/16/2011    PCP: Emeterio Reeve Early   REFERRING PROVIDER: Dr Vanetta Mulders   REFERRING DIAG:G54.0 (ICD-10-CM) - Thoracic outlet syndrome    THERAPY DIAG:  Acute pain of left shoulder  Weakness  Rationale for Evaluation and Treatment: Rehabilitation  ONSET DATE:   SUBJECTIVE:  SUBJECTIVE STATEMENT: The patient reports that the numbness in her hand is back. She is also having significant pain in her hypothenar area.  PERTINENT HISTORY: Migrans; anemia   PAIN:  Are you having pain? Yes: NPRS scale: 5-6 /10 Pain location: starts int he hand and can go up  Pain description: aching/ numbness/ burning  Aggravating factors: all the time since last night  Relieving factors: very hot shower   PRECAUTIONS: Charo Malformation   WEIGHT BEARING RESTRICTIONS: No  FALLS:  Has patient fallen in last 6 months? No  LIVING ENVIRONMENT:  OCCUPATION: Accountant   Recreation: Nothing particular    PLOF: Independent  PATIENT GOALS: Less pain   NEXT MD VISIT:   OBJECTIVE:   DIAGNOSTIC FINDINGS:  IMPRESSION: 1. Small central disc protrusion with uncovertebral spurring at C6-7 with resultant mild spinal stenosis, with mild to moderate left C7 foraminal narrowing. 2. Mild disc bulging with tiny central disc protrusion at C5-6 without significant stenosis or impingement. 3. 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 No syrinx.  PATIENT SURVEYS :  FOTO 64; predicted 66  COGNITION: Overall cognitive status: Within functional  limits for tasks assessed     SENSATION: Numbness into the hand and shooting pain down the arm  POSTURE: Rounded shoulder and forward head   UPPER EXTREMITY ROM:   Active ROM Right eval Left eval  Shoulder flexion  Causes numbness  Shoulder extension    Shoulder abduction    Shoulder adduction    Shoulder internal rotation  Causes numbness   Shoulder external rotation  Causes numbness    Elbow flexion    Elbow extension    Wrist flexion    Wrist extension    Wrist ulnar deviation    Wrist radial deviation    Wrist pronation    Wrist supination    (Blank rows = not tested)  Grip:  L 5 lbs  R 40 lbs    UPPER EXTREMITY MMT:  MMT Right eval Left eval  Shoulder flexion 5 4  Shoulder extension    Shoulder abduction 5 4  Shoulder adduction    Shoulder internal rotation 5 4  Shoulder external rotation    Middle trapezius    Lower trapezius    Elbow flexion    Elbow extension    Wrist flexion    Wrist extension    Wrist ulnar deviation    Wrist radial deviation    Wrist pronation    Wrist supination    Grip strength (lbs)    (Blank rows = not tested)  SHOULDER SPECIAL TESTS: did not assess today 1/24   JOINT MOBILITY TESTING: did not assess today 1/24   PALPATION: TTP pec minor, major, deltoid 1/24    TODAY'S TREATMENT:  DATE:  2/19 Manual: trigger point release to pec tendon; trigger point release to upper trap and posterior musculature.  ABC 1lb 2x  Wand press x20  Wand flexion x20   Standing forward flexion 1lb  2x10  Standing scaption 2x10  All with cuing not to go through numbness      2/14 Manual: trigger point release to pec tendon; trigger point release to upper trap and posterior musculature. Reviewed self soft tissue mobilization using the thera-cane  Wand flexion 2x10 in pain free ranges Scap  retraction red x20  Shoulder extension green  x20  Forward   Trigger Point Dry-Needling  Treatment instructions: Expect mild to moderate muscle soreness. S/S of pneumothorax if dry needled over a lung field, and to seek immediate medical attention should they occur. Patient verbalized understanding of these instructions and education.  Patient Consent Given: Yes Education handout provided: Yes Muscles treated: hypothenar on the left 3x with a .25x30 needle  Electrical stimulation performed: No Parameters: N/A Treatment response/outcome: great twitch       PATIENT EDUCATION: Education details: HEP modifications, TPDN, work Nurse, mental health educated: Patient Education method: Consulting civil engineer, Media planner, Corporate treasurer cues, Verbal cues, and Handouts Education comprehension: verbalized understanding, returned demonstration, verbal cues required, tactile cues required, and needs further education  HOME EXERCISE PROGRAM: Access Code: [email or web address] URL: [email or web address] Date: 02/11/2022 Prepared by: Estill Bamberg April Thurnell Garbe  Exercises - Doorway Pec Stretch at 60 Elevation  - 1 x daily - 7 x weekly - 2 sets - 30 sec hold - Doorway Pec Stretch at 90 Degrees Abduction  - 1 x daily - 7 x weekly - 2 sets - 30 sec hold - Doorway Pec Stretch at 120 Degrees Abduction  - 1 x daily - 7 x weekly - 2 sets - 30 sec hold - Shoulder External Rotation and Scapular Retraction with Resistance  - 1 x daily - 7 x weekly - 2 sets - 10 reps - 3 sec hold - Shoulder W - External Rotation with Resistance  - 1 x daily - 7 x weekly - 2 sets - 10 reps - 3 sec hold - Standing Shoulder Row with Anchored Resistance  - 1 x daily - 7 x weekly - 2 sets - 10 reps - 3 sec hold  Patient Education - Trigger Point Dry Needling - Office Posture  ASSESSMENT:  CLINICAL IMPRESSION: The patient continues to make excellent progress. She has had very little pain. Her arm still goes numb at night at times. Her grip  was equal L V R. We worked on standing exercises today with weights. Those are still activity's that can bring on the numbness. She still had some spasming in her pec. She was advised to continue with self stretching at home. She has one more visit. She will likely discharge/ be put on hold next visit depending on how she is doing.    OBJECTIVE IMPAIRMENTS: decreased activity tolerance, decreased ROM, decreased strength, impaired UE functional use, postural dysfunction, and pain.    GOALS: Goals reviewed with patient? Yes  SHORT TERM GOALS: Target date: 03/03/2022    Will increase grip strength by 10 pounds on the left Baseline: Goal status: INITIAL  2.  Patient will reach overhead with a 50% reduction in numbness and tingling Baseline:  Goal status: INITIAL  3.  Patient will be independent with basic stretching and exercise program Baseline:    LONG TERM GOALS: Target date: 03/24/2022    Patient will sleep through the night without pain  or numbness Baseline:  Goal status: INITIAL  2.  Patient will reach overhead shelf and grab objects without increased numbness Baseline:  Goal status: INITIAL  3.  Patient will increase left grip strength to 40 pounds in order to perform ADLs and IADLs Baseline:  Goal status: INITIAL   PLAN: PT FREQUENCY: 1-2x/week  PT DURATION: 6 weeks  PLANNED INTERVENTIONS: Therapeutic exercises, Therapeutic activity, Neuromuscular re-education, Patient/Family education, Self Care, Joint mobilization, Aquatic Therapy, Dry Needling, Cryotherapy, Moist heat, Taping, Ultrasound, and Manual therapy  PLAN FOR NEXT SESSION: consider needling of the pec, deltoid, UT. Continue pec stretching as tolerated. Continue posterior chain strengthening    Carolyne Littles PT DPT  02/26/2022, 9:52 AM

## 2022-03-09 NOTE — Progress Notes (Unsigned)
MEDICAL GENETICS NEW PATIENT EVALUATION  Patient name: Betty Leonard DOB: 07-13-1980 Age: 42 y.o. MRN: [id number]  Referring Provider/Specialty: Jacolyn Reedy, NP / [place] Date of Evaluation: 03/12/2022 Chief Complaint/Reason for Referral: Glucocerebrosidase deficiency  HPI: Betty Leonard is a 42 y.o. 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 She is unaccompanied at today's visit.  Betty Leonard has a history of multiple health concerns. She has had anemia for 10+ years. In 2021 she began receiving iron infusions every 3 mos but had to stop in 2022 because her ferritin became too high. She also has had an ablation. Betty Leonard's vitamin D is low despite taking 50,000 units every week. She experienced hair loss in 2020 suddenly and receives infusions (Kenalog) through dermatology. She never had any allergies and then 10 years ago she experienced anaphylactic shock in a restaurant while eating shrimp. She was diagnosed with a shellfish allergy and now carries an epipen with her everywhere.  Susen reports orthopedic concerns. She reports her bones break frequently- she has had 3 foot reconstructions and her knee ligaments redone. She reports joint/bone pain and fatigue. Betty Leonard follows with orthopedics for possible thoracic outlet syndrome. Peripheral EMG nerve conduction test was normal. She has not had imaging/DEXA scan. She has been receiving PT for the past 2 months. She is not hypermobile.   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 Betty Leonard's paternal grandfather had Gaucher disease and received weekly infusions [facility]. Betty Leonard has been reading about Gaucher disease and feels her symptoms align with those seen in affected individuals. She underwent glucocerebrosidase enzyme testing through her PCP, which was below the normal range (4.248, ref range 7.5-14.5 nanomoles/hr/mg). She is interested in genetic testing to determine if she has Gaucher disease or is a carrier. She does not have any known liver, spleen, or lung concerns, and platelet levels are normal.   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 Deatra Ina and GI 12/16/2011    Past Surgical History:  Past Surgical History:  Procedure Laterality Date   ESOPHAGOGASTRODUODENOSCOPY  12/24/2011   Procedure:  ESOPHAGOGASTRODUODENOSCOPY (EGD);  Surgeon: Inda Castle, MD;  Location: [facility];  Service: Endoscopy;  Laterality: N/A;   FOOT SURGERY Right 2009   repair remotely after a fracture   FOOT SURGERY Right 1'2020   HYSTEROSCOPY WITH NOVASURE N/A 04/25/2019   Procedure: HYSTEROSCOPY WITH NOVASURE ABLATION;  Surgeon: Megan Salon, MD;  Location: [facility];  Service: Gynecology;  Laterality: N/A;   KNEE ARTHROSCOPY Left 2018    Social History: Social History   Social History Narrative   Work or Database administrator, [employer]      Home Situation: lives with roomate      Spiritual Beliefs: Christian      Lifestyle: CV exercise (12mnutes) 3 days per week; diet is poor                Medications: Current Outpatient Medications on File Prior to Visit  Medication Sig Dispense Refill   sertraline (ZOLOFT) 50 MG tablet Take 1/2 tab by mouth at bedtime for the first 4 days then increase to full tab at bedtime. 30 tablet 3   Vitamin D, Ergocalciferol, (DRISDOL) 1.25 MG (50000 UNIT) CAPS capsule Take 1 capsule (50,000 Units total) by mouth every 7 (seven) days. Take for 12 total doses(weeks) than can transition to 1000 units OTC supplement daily 12 capsule 3   EPINEPHrine 0.3 mg/0.3 mL IJ SOAJ injection Inject 0.3 mg into the muscle as needed for anaphylaxis. (Patient not taking: Reported on 03/04/2022)     hydrOXYzine (ATARAX) 50 MG tablet Take 0.5-1 tablets (25-50 mg total) by mouth at bedtime as needed. For anxiety and sleep. (Patient not taking: Reported on 03/04/2022) 30 tablet 3   ibuprofen (ADVIL) 800 MG tablet Take 1 tablet (800 mg total) by mouth 3 (three) times daily. (Patient not taking: Reported on 03/04/2022) 90 tablet 1   No current facility-administered medications on file prior to visit.    Allergies:  Allergies  Allergen Reactions   Codeine Shortness Of Breath   Hydrocodone Shortness Of Breath   Iodine Other (See Comments)    Shellfish allergy   Shellfish  Allergy Anaphylaxis   Aspirin Hives    Immunizations: up to date  Review of Systems: General: elevated BMI- difficulty losing weight. Eyes/vision: glasses recently- can't see distance. Ears/hearing: no concerns. Dental: no concerns.  Respiratory: no concerns. Cardiovascular: no concerns. Gastrointestinal: random vomiting- will last a few days, vomit after meals. Certain food groups seem to make it worse- mostly dairy, acidic sauces, spicy foods. Genitourinary: no concerns. Endocrine: endometriosis. Ablation. Low vit D. In past was told to put some kind of cream behind knees after hormonal testing.  Hematologic: anemia- received infusions but now has elevated ferritin. Normal platelets. Immunologic: frequently sick/sick easily. Neurological: migraines for many years, went away for a bit, but coming back in the past year (2-3 times a year). Psychiatric: anxiety (on zoloft).  Musculoskeletal: joint pain. Not flexible.  Skin, Hair, Nails: no concerns. Alopecia- Hair fell out in 2021, was growing back but then stopped. Was on injections but not able to do recently due to anemia.  Family History: See pedigree below obtained during today's visit: ***  Notable family history: KDorcieis one of two children between her parents. Her brother ((71yo) had an extra  bone in his knee that was removed but is otherwise healthy. He did testing through [employer] that showed he was at least a carrier of Gaucher disease ([employer] only tests for 3 specific variants within the gene). The brother has two daughters, one of whom (25 yo) has low iron. Betty Leonard's mother has schizophrenia and bipolar disease; there is otherwise limited information regarding her health. Mairen's father died suddenly at 80 yo. Autopsy did not identify a specific cause of death. He had diabetes and degenerative disk disease. Paternal grandfather had Gaucher disease (treated with infusions) and multiple cancers (lung, possibly leukemia, and either  prostate or pancreatic). Paternal grandmother had multiple myeloma.  Mother's ethnicity: White Father's ethnicity: White, Ashkenazi Jewish Consanguinity: Denies  Physical Examination: Weight: *** (***%) Height: *** (***%); mid-parental ***% Head circumference: *** (***%)  Ht 5' 6.58" (1.691 m)   Wt 236 lb (107 kg)   HC 55 cm (21.65")   BMI 37.44 kg/m   General: ***Alert, interactive Head: ***Normocephalic Eyes: ***[place]et, ***Normal lids, lashes, brows, ICD *** cm, OCD *** cm, Calculated***/Measured*** IPD *** cm (***%) Nose: *** Lips/Mouth/Teeth: *** Ears: ***Normoset and normally formed, no pits, tags or creases Neck: ***Normal appearance Chest: ***No pectus deformities, nipples appear normally spaced and formed, IND *** cm, CC *** cm, IND/CC ratio *** (***%) Heart: ***Warm and well perfused Lungs: ***No increased work of breathing Abdomen: ***Soft, non-distended, no masses, no hepatosplenomegaly, no hernias Genitalia: *** Skin: ***No axillary or inguinal freckling Hair: ***Normal anterior and posterior hairline, ***normal texture Neurologic: ***Normal gross motor by observation, no abnormal movements Psych: *** Back/spine: ***No scoliosis, ***no sacral dimple Extremities: ***Symmetric and proportionate Hands/Feet: ***Normal hands, fingers and nails, ***2 palmar creases bilaterally, ***Normal feet, toes and nails, ***No clinodactyly, syndactyly or polydactyly  ***Photo of patient in Epic (parental verbal consent obtained)  Prior Genetic testing: ***  Pertinent Labs: ***  Pertinent Imaging/Studies: ***  Assessment: ADALEIGH CLENDENEN is a 42 y.o. female with ***. Growth parameters show ***. Development ***. Physical examination notable for ***. Family history is ***.  Gaucher disease is an autosomal recessive condition caused by pathogenic variants in both copies of the GBA gene (also known as GBA1 gene). The GBA gene codes for the beta-glucocerebrosidase enzyme,  which helps break down glucocerebroside in the body. When the GBA gene is not working properly and sufficient enzyme is not produced, glucocerebroside and other toxic substances can build up and cause damage to the organs. This leads to the symptoms of Gaucher disease. There are multiple types of Gaucher disease ranging in severity and onset. Type 1 is associated with later onset (childhood to adulthood) and symptoms can include hepatosplenomegaly, anemia, thrombocytopenia, lung disease, and bone disease. Types 2 and 3 are typically more severe with neurological features and have an earlier onset with decreased lifespan.  Individuals who have a single variant in only one copy of the GBA gene are considered carriers of the Gaucher disease. Carriers typically do not have symptoms (though some research suggests there may be an increased risk of Parkinson disease). Certain populations, such as those of Ashkenazi Jewish descent, are more likely to be carriers of Gaucher disease.  Individuals who are affected with Gaucher disease and carriers can have low levels on glucocerebrosidase enzyme testing. Therefore, genetic testing is recommended to determine if someone is affected or a carrier. While variants can occur throughout the GBA gene, there are some variants that are more commonly seen in the general population or within specific population groups. [facility] offers  a test that looks for the 19 most common variants in the GBA gene. There is also a sponsored study through [employer] called the [employer], which does full gene sequencing and assesses for del/dups. We will start with the [facility] panel and may pursue testing through the [facility] if appropriate/desired. If it is determined that Damien has Gaucher disease, then referral can be made to the appropriate clinics for treatment options. If she is a carrier and not affected, she would not require treatment for Gaucher disease, but should continue to  work with her doctors to determine other potential causes of her symptoms.  Recommendations: ***  A ***blood/saliva/buccal sample was obtained during today's visit for the above genetic testing and sent to ***. Results are anticipated in ***4-6 weeks. We will contact the family to discuss results once available and arrange follow-up as needed.    Heidi Dach, MS, [facility] Certified Genetic Counselor  Artist Pais, D.O. Attending Physician, [facility] Date: 03/12/2022 Time: ***   Total time spent: *** Time spent includes face to face and non-face to face care for the patient on the date of this encounter (history and physical, genetic counseling, coordination of care, data gathering and/or documentation as outlined)

## 2022-03-12 ENCOUNTER — Encounter (INDEPENDENT_AMBULATORY_CARE_PROVIDER_SITE_OTHER): Payer: Self-pay | Admitting: Pediatric Genetics

## 2022-03-12 ENCOUNTER — Ambulatory Visit (INDEPENDENT_AMBULATORY_CARE_PROVIDER_SITE_OTHER): Payer: 59 | Admitting: Pediatric Genetics

## 2022-03-12 VITALS — Ht 66.58 in | Wt 236.0 lb

## 2022-03-12 DIAGNOSIS — Z8349 Family history of other endocrine, nutritional and metabolic diseases: Secondary | ICD-10-CM

## 2022-03-12 DIAGNOSIS — E7522 Gaucher disease: Secondary | ICD-10-CM

## 2022-03-18 ENCOUNTER — Encounter (HOSPITAL_BASED_OUTPATIENT_CLINIC_OR_DEPARTMENT_OTHER): Payer: Self-pay | Admitting: Physical Therapy

## 2022-03-18 ENCOUNTER — Ambulatory Visit (HOSPITAL_BASED_OUTPATIENT_CLINIC_OR_DEPARTMENT_OTHER): Payer: 59 | Admitting: Physical Therapy

## 2022-03-18 DIAGNOSIS — R531 Weakness: Secondary | ICD-10-CM

## 2022-03-18 DIAGNOSIS — M25512 Pain in left shoulder: Secondary | ICD-10-CM

## 2022-03-18 NOTE — Therapy (Signed)
OUTPATIENT PHYSICAL THERAPY TREATMENT/Discharge    Patient Name: Betty Leonard MRN: [id number] DOB:13-Dec-1980, 42 y.o., female Today's Date: 03/18/2022  END OF SESSION:  PT End of Session - 03/18/22 1318     Visit Number 8    Number of Visits 12    Date for PT Re-Evaluation 03/24/22    PT Start Time 1300    PT Stop Time 1340    PT Time Calculation (min) 40 min    Activity Tolerance Patient tolerated treatment well    Behavior During Therapy [facility] for tasks assessed/performed              Past Medical History:  Diagnosis Date   Anemia    iron infusion sept 2020   Chicken pox    Depression    Esophagitis 12/24/2011   GERD (gastroesophageal reflux disease)    History of wheezing 06/06/2018   Menorrhagia    with irregular cycles   Migraines    Pneumonia 07/2018   Seasonal allergies    Sinusitis 11/12/2021   UTI (urinary tract infection) finished antibiotic 04-11-2019   Past Surgical History:  Procedure Laterality Date   ESOPHAGOGASTRODUODENOSCOPY  12/24/2011   Procedure: ESOPHAGOGASTRODUODENOSCOPY (EGD);  Surgeon: Inda Castle, MD;  Location: [facility];  Service: Endoscopy;  Laterality: N/A;   FOOT SURGERY Right 2009   repair remotely after a fracture   FOOT SURGERY Right 1'2020   HYSTEROSCOPY WITH NOVASURE N/A 04/25/2019   Procedure: HYSTEROSCOPY WITH NOVASURE ABLATION;  Surgeon: Megan Salon, MD;  Location: [facility];  Service: Gynecology;  Laterality: N/A;   KNEE ARTHROSCOPY Left 2018   Patient Active Problem List   Diagnosis Date Noted   Injury of left hand 02/02/2022   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 Deatra Ina and GI 12/16/2011    PCP: Emeterio Reeve Early   REFERRING PROVIDER: Dr Vanetta Mulders   REFERRING DIAG:G54.0 (ICD-10-CM) - Thoracic outlet syndrome    THERAPY DIAG:  Acute pain of left shoulder  Weakness  Rationale for Evaluation and Treatment: Rehabilitation  ONSET DATE:   SUBJECTIVE:  SUBJECTIVE STATEMENT: The patient reports her pain has resolved. She has  PERTINENT HISTORY: Migrans; anemia   PAIN:  Are you having pain? Yes: NPRS scale: 5-6 /10 Pain location: starts int he hand and can go up  Pain description: aching/ numbness/ burning  Aggravating factors: all the time since last night  Relieving factors: very hot shower   PRECAUTIONS: Charo Malformation   WEIGHT BEARING RESTRICTIONS: No  FALLS:  Has patient fallen in last 6 months? No  LIVING ENVIRONMENT:  OCCUPATION: Accountant   Recreation: Nothing particular    PLOF: Independent  PATIENT GOALS: Less pain   NEXT MD VISIT:   OBJECTIVE:   DIAGNOSTIC FINDINGS:  IMPRESSION: 1. Small central disc protrusion with uncovertebral spurring at C6-7 with resultant mild spinal stenosis, with mild to moderate left C7 foraminal narrowing. 2. Mild disc bulging with tiny central disc protrusion at C5-6 without significant stenosis or impingement. 3. 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 No syrinx.  PATIENT SURVEYS :  FOTO 64; predicted 21  COGNITION: Overall cognitive status: Within functional limits for tasks assessed     SENSATION: Numbness into the  hand and shooting pain down the arm  POSTURE: Rounded shoulder and forward head   UPPER EXTREMITY ROM:   Active ROM Right eval Left eval  Shoulder flexion  Causes numbness  Shoulder extension    Shoulder abduction    Shoulder adduction    Shoulder internal rotation  Causes numbness   Shoulder external rotation  Causes numbness    Elbow flexion    Elbow extension    Wrist flexion    Wrist extension    Wrist ulnar deviation    Wrist radial deviation    Wrist pronation    Wrist supination    (Blank rows = not tested)  Grip:  L 5 lbs  R 40 lbs    UPPER EXTREMITY MMT:  MMT Right eval Left eval  Shoulder flexion 5 4  Shoulder extension    Shoulder abduction 5 4  Shoulder adduction    Shoulder internal rotation 5 4  Shoulder external rotation    Middle trapezius    Lower trapezius    Elbow flexion    Elbow extension    Wrist flexion    Wrist extension    Wrist ulnar deviation    Wrist radial deviation    Wrist pronation    Wrist supination    Grip strength (lbs)    (Blank rows = not tested)  SHOULDER SPECIAL TESTS: did not assess today 1/24   JOINT MOBILITY TESTING: did not assess today 1/24   PALPATION: TTP pec minor, major, deltoid 1/24    TODAY'S TREATMENT:  DATE:  2/28 Manual: trigger point release to pec tendon; trigger point release to upper trap and posterior musculature.  ABC 1lb 3x  Wand press x20  Wand flexion x20   Wand IR stretch   Row green x35  Shoulder extension to neutral x35 green    2/19 Manual: trigger point release to pec tendon; trigger point release to upper trap and posterior musculature.  ABC 1lb 2x  Wand press x20  Wand flexion x20   Standing forward flexion 1lb  2x10  Standing scaption 2x10  All with cuing not to go through numbness      2/14 Manual: trigger point release  to pec tendon; trigger point release to upper trap and posterior musculature. Reviewed self soft tissue mobilization using the thera-cane  Wand flexion 2x10 in pain free ranges Scap retraction red x20  Shoulder extension green  x20  Forward   Trigger Point Dry-Needling  Treatment instructions: Expect mild to moderate muscle soreness. S/S of pneumothorax if dry needled over a lung field, and to seek immediate medical attention should they occur. Patient verbalized understanding of these instructions and education.  Patient Consent Given: Yes Education handout provided: Yes Muscles treated: hypothenar on the left 3x with a .25x30 needle  Electrical stimulation performed: No Parameters: N/A Treatment response/outcome: great twitch       PATIENT EDUCATION: Education details: HEP modifications, TPDN, work Nurse, mental health educated: Patient Education method: Consulting civil engineer, Media planner, Corporate treasurer cues, Verbal cues, and Handouts Education comprehension: verbalized understanding, returned demonstration, verbal cues required, tactile cues required, and needs further education  HOME EXERCISE PROGRAM: Access Code: [email or web address] URL: [email or web address] Date: 02/11/2022 Prepared by: Estill Bamberg April Thurnell Garbe  Exercises - Doorway Pec Stretch at 60 Elevation  - 1 x daily - 7 x weekly - 2 sets - 30 sec hold - Doorway Pec Stretch at 90 Degrees Abduction  - 1 x daily - 7 x weekly - 2 sets - 30 sec hold - Doorway Pec Stretch at 120 Degrees Abduction  - 1 x daily - 7 x weekly - 2 sets - 30 sec hold - Shoulder External Rotation and Scapular Retraction with Resistance  - 1 x daily - 7 x weekly - 2 sets - 10 reps - 3 sec hold - Shoulder W - External Rotation with Resistance  - 1 x daily - 7 x weekly - 2 sets - 10 reps - 3 sec hold - Standing Shoulder Row with Anchored Resistance  - 1 x daily - 7 x weekly - 2 sets - 10 reps - 3 sec hold  Patient Education - Trigger Point Dry Needling -  Office Posture  ASSESSMENT:  CLINICAL IMPRESSION: The patient continues to make progress. She has had some incidences of sharp pain when reaching back. She has spasming when it happens then it release. She feels like overall she is making progress. Sh was given stretching for behind her back motion. She was also given a heavier band. Overall she has a good plan for going forward. She will D/C at this time to HEP. See below for goal specific progress.   OBJECTIVE IMPAIRMENTS: decreased activity tolerance, decreased ROM, decreased strength, impaired UE functional use, postural dysfunction, and pain.    GOALS: Goals reviewed with patient? Yes  SHORT TERM GOALS: Target date: 03/03/2022    Will increase grip strength by 10 pounds on the left Baseline: Goal status full achieved 2/28  2.  Patient will reach overhead with a 50% reduction in numbness and tingling Baseline:  Goal status: very little achieved 2/28  3.  Patient will be independent with basic stretching and exercise program Baseline: achieved 2/28    LONG TERM GOALS: Target date: 03/24/2022    Patient will sleep through the night without pain or numbness Baseline:  Goal status:  2.  Patient will reach overhead shelf and grab objects without increased numbness Baseline:  Goal status:  achieved 2/28  3.  Patient will increase left grip strength to 40 pounds in order to perform ADLs and IADLs Baseline: equal left and right 2/28     PLAN: PT FREQUENCY: 1-2x/week  PT DURATION: 6 weeks  PLANNED INTERVENTIONS: Therapeutic exercises, Therapeutic activity, Neuromuscular re-education, Patient/Family education, Self Care, Joint mobilization, Aquatic Therapy, Dry Needling, Cryotherapy, Moist heat, Taping, Ultrasound, and Manual therapy  PLAN FOR NEXT SESSION: consider needling of the pec, deltoid, UT. Continue pec stretching as tolerated. Continue posterior chain strengthening    Carolyne Littles PT DPT  03/18/2022, 1:51 PM

## 2022-03-19 ENCOUNTER — Encounter (INDEPENDENT_AMBULATORY_CARE_PROVIDER_SITE_OTHER): Payer: Self-pay | Admitting: Pediatric Genetics

## 2022-03-19 ENCOUNTER — Telehealth (INDEPENDENT_AMBULATORY_CARE_PROVIDER_SITE_OTHER): Payer: Self-pay | Admitting: Pediatric Genetics

## 2022-03-19 NOTE — Telephone Encounter (Signed)
Who's calling (name and relationship to patient) : Dyann Brandell; self  Best contact number: [phone]  Provider they see: Dr.  Retta Mac  Reason for call: Malaiya has called in stating that she is needing a confirmation code for the [facility] that was ordered. She also wanted to know if her meds was posted to Mychart she is not able to see them.   Call ID:      PRESCRIPTION REFILL ONLY  Name of prescription:  Pharmacy:

## 2022-03-19 NOTE — Patient Instructions (Addendum)
At [facility], we are committed to providing exceptional care. You will receive a patient satisfaction survey through text or email regarding your visit today. Your opinion is important to me. Comments are appreciated.  Recommendations: [facility] Gaucher Common Variants Test A buccal sample was obtained during today's visit for the above genetic testing and sent to [facility]. Results are anticipated in 4-6 weeks. We will contact the patient to discuss results once available and arrange follow-up as needed.  Betty Leonard is also interested in completing testing through [facility] which is sponsored testing and sequences the entire gene + assesses for deletions + duplications rather than only looking for common variants. This will require a blood sample, which I will work to arrange. Through this test, the lab will run a glucocerebrosidase enzyme level first and if abnormal, will then proceed with the GBA gene studies.   If genetic testing is inconclusive, we could check levels of accumulated substrates next (Chito, lyso-GL1, etc).

## 2022-04-08 ENCOUNTER — Encounter: Payer: Self-pay | Admitting: Vascular Surgery

## 2022-04-08 ENCOUNTER — Ambulatory Visit (INDEPENDENT_AMBULATORY_CARE_PROVIDER_SITE_OTHER): Payer: 59 | Admitting: Orthopaedic Surgery

## 2022-04-08 ENCOUNTER — Ambulatory Visit (INDEPENDENT_AMBULATORY_CARE_PROVIDER_SITE_OTHER): Payer: 59 | Admitting: Vascular Surgery

## 2022-04-08 VITALS — BP 114/81 | HR 92 | Temp 97.8°F | Resp 20 | Ht 66.0 in | Wt 237.8 lb

## 2022-04-08 DIAGNOSIS — G540 Brachial plexus disorders: Secondary | ICD-10-CM

## 2022-04-08 NOTE — Progress Notes (Signed)
Chief Complaint: Left hand pain and numbness     History of Present Illness:   04/08/2022: Presents today for follow-up of her left arm.  She is overall dramatically improved with physical therapy.  At this time she is really not having any residual symptoms.  They have done some dry needling as well.  Betty Leonard is a 42 y.o. female left-hand-dominant female presents today with persistent ongoing numbness in the small finger with occasional shooting up and down the forearm.  She states that this came on quite suddenly 1 week ago.  She has been on gabapentin and Celebrex from Dr. Ninfa Linden which did not help her at all.  She feels like the hand is getting weak and is having a hard time opening jars.  She is experiencing numbness in the small 2 fingers.  This is worse at night.  She works in Press photographer    Surgical History:   None  PMH/PSH/Family History/Social History/Meds/Allergies:    Past Medical History:  Diagnosis Date   Anemia    iron infusion sept 2020   Chicken pox    Depression    Esophagitis 12/24/2011   GERD (gastroesophageal reflux disease)    History of wheezing 06/06/2018   Menorrhagia    with irregular cycles   Migraines    Pneumonia 07/2018   Seasonal allergies    Sinusitis 11/12/2021   UTI (urinary tract infection) finished antibiotic 04-11-2019   Past Surgical History:  Procedure Laterality Date   ESOPHAGOGASTRODUODENOSCOPY  12/24/2011   Procedure: ESOPHAGOGASTRODUODENOSCOPY (EGD);  Surgeon: Inda Castle, MD;  Location: [facility];  Service: Endoscopy;  Laterality: N/A;   FOOT SURGERY Right 2009   repair remotely after a fracture   FOOT SURGERY Right 1'2020   HYSTEROSCOPY WITH NOVASURE N/A 04/25/2019   Procedure: HYSTEROSCOPY WITH NOVASURE ABLATION;  Surgeon: Megan Salon, MD;  Location: [facility];  Service: Gynecology;  Laterality: N/A;   KNEE ARTHROSCOPY Left 2018   Social History    Socioeconomic History   Marital status: Single    Spouse name: Not on file   Number of children: Not on file   Years of education: Not on file   Highest education level: Not on file  Occupational History   Not on file  Tobacco Use   Smoking status: Former    Packs/day: 1.00    Years: 5.00    Additional pack years: 0.00    Total pack years: 5.00    Types: Cigarettes    Quit date: 12/29/2018    Years since quitting: 3.2   Smokeless tobacco: Never   Tobacco comments:    stopped on 12/29/2018  Vaping Use   Vaping Use: Never used  Substance and Sexual Activity   Alcohol use: Not Currently    Alcohol/week: 0.0 standard drinks of alcohol   Drug use: No   Sexual activity: Not Currently    Birth control/protection: Abstinence  Other Topics Concern   Not on file  Social History Narrative   Work or Database administrator, [employer]      Home Situation: lives with roomate      Spiritual Beliefs: Christian      Lifestyle: CV exercise (80minutes) 3 days per week; diet is poor  Social Determinants of Health   Financial Resource Strain: Not on file  Food Insecurity: No Food Insecurity (12/06/2019)   Hunger Vital Sign    Worried About Running Out of Food in the Last Year: Never true    Ran Out of Food in the Last Year: Never true  Transportation Needs: Not on file  Physical Activity: Not on file  Stress: Not on file  Social Connections: Not on file   Family History  Problem Relation Age of Onset   Leukemia Paternal Grandfather    Lung cancer Paternal Grandfather    Arthritis Paternal Grandfather    Diabetes Paternal Grandfather    Heart disease Paternal Grandfather    Hypertension Paternal Grandfather    Kidney disease Paternal Grandfather    Prostate cancer Paternal Grandfather    Gaucher's disease Paternal 67    Sudden death Father        age 22, nothing found on autopsy   Diabetes Father    Hyperlipidemia Father    Hypertension Father    Mental  illness Mother        bipolar, schizophrenia   Alcohol abuse Maternal Grandfather    Stroke Paternal Grandmother    Colon cancer Neg Hx    Allergies  Allergen Reactions   Codeine Shortness Of Breath   Hydrocodone Shortness Of Breath   Iodine Other (See Comments)    Shellfish allergy   Shellfish Allergy Anaphylaxis   Aspirin Hives   Current Outpatient Medications  Medication Sig Dispense Refill   EPINEPHrine 0.3 mg/0.3 mL IJ SOAJ injection Inject 0.3 mg into the muscle as needed for anaphylaxis. (Patient not taking: Reported on 03/04/2022)     hydrOXYzine (ATARAX) 50 MG tablet Take 0.5-1 tablets (25-50 mg total) by mouth at bedtime as needed. For anxiety and sleep. (Patient not taking: Reported on 03/04/2022) 30 tablet 3   ibuprofen (ADVIL) 800 MG tablet Take 1 tablet (800 mg total) by mouth 3 (three) times daily. (Patient not taking: Reported on 03/04/2022) 90 tablet 1   sertraline (ZOLOFT) 50 MG tablet Take 1/2 tab by mouth at bedtime for the first 4 days then increase to full tab at bedtime. 30 tablet 3   Vitamin D, Ergocalciferol, (DRISDOL) 1.25 MG (50000 UNIT) CAPS capsule Take 1 capsule (50,000 Units total) by mouth every 7 (seven) days. Take for 12 total doses(weeks) than can transition to 1000 units OTC supplement daily 12 capsule 3   No current facility-administered medications for this visit.   No results found.  Review of Systems:   A ROS was performed including pertinent positives and negatives as documented in the HPI.  Physical Exam :   Constitutional: NAD and appears stated age Neurological: Alert and oriented Psych: Appropriate affect and cooperative There were no vitals taken for this visit.   Comprehensive Musculoskeletal Exam:    Positive Tinel with flexion of the elbow.  Numbness subjectively in the small digit.  There is some flexion of the small digit is consistent with intrinsic weakness  Significant tenderness to palpation about the ECU tendon of the left  wrist.  Imaging:     I personally reviewed and interpreted the radiographs.   Assessment:   42 y.o. female with acute left hand pain and weakness in the ulnar distribution.  Her EMG nerve conduction test was not consistent with any type of neuropathy.  Given that I do believe that this may more likely represent a thoracic outlet syndrome given the fact that she is pain and  numbness with overhead activity.  I being said she has improved completely with anterior chain mobilization of the left shoulder.  To that effect I will plan to see her back as needed Plan :    -Return to clinic as needed   I personally saw and evaluated the patient, and participated in the management and treatment plan.  Vanetta Mulders, MD Attending Physician, Orthopedic Surgery  This document was dictated using Dragon voice recognition software. A reasonable attempt at proof reading has been made to minimize errors.

## 2022-04-08 NOTE — Progress Notes (Signed)
Patient ID: Betty Leonard, female   DOB: 08-29-1980, 42 y.o.   MRN: [id number]  Reason for Consult: Follow-up   Referred by Early, Coralee Pesa, NP  Subjective:     HPI:  Betty Leonard is a 42 y.o. female has a history concerning for left upper extremity thoracic outlet syndrome.  She has been evaluated by orthopedic surgery has also had EMG testing in the past.  She has undergone physical therapy completed 2 weeks ago and states that now her symptoms are much better.  This was done in conjunction with dry needling and really at this time she only has complaints and shooting pain when she reaches behind her which she has been able to avoid.  She does not have any wounds on her hand currently does not have limitation.  Previously she was having numbness in her hand distally with raising her hand up she is also experiencing pins-and-needles and was also having significant issues with dropping items she was holding which was difficult for her given that she is left-handed.  She denies any previous injuries to the left upper extremity or neck.  Past Medical History:  Diagnosis Date   Anemia    iron infusion sept 2020   Chicken pox    Depression    Esophagitis 12/24/2011   GERD (gastroesophageal reflux disease)    History of wheezing 06/06/2018   Menorrhagia    with irregular cycles   Migraines    Pneumonia 07/2018   Seasonal allergies    Sinusitis 11/12/2021   UTI (urinary tract infection) finished antibiotic 04-11-2019   Family History  Problem Relation Age of Onset   Leukemia Paternal Grandfather    Lung cancer Paternal Grandfather    Arthritis Paternal Grandfather    Diabetes Paternal Grandfather    Heart disease Paternal Grandfather    Hypertension Paternal Grandfather    Kidney disease Paternal Grandfather    Prostate cancer Paternal Grandfather    Gaucher's disease Paternal 77    Sudden death Father        age 34, nothing found on autopsy   Diabetes Father     Hyperlipidemia Father    Hypertension Father    Mental illness Mother        bipolar, schizophrenia   Alcohol abuse Maternal Grandfather    Stroke Paternal Grandmother    Colon cancer Neg Hx    Past Surgical History:  Procedure Laterality Date   ESOPHAGOGASTRODUODENOSCOPY  12/24/2011   Procedure: ESOPHAGOGASTRODUODENOSCOPY (EGD);  Surgeon: Inda Castle, MD;  Location: [facility];  Service: Endoscopy;  Laterality: N/A;   FOOT SURGERY Right 2009   repair remotely after a fracture   FOOT SURGERY Right 1'2020   HYSTEROSCOPY WITH NOVASURE N/A 04/25/2019   Procedure: HYSTEROSCOPY WITH NOVASURE ABLATION;  Surgeon: Megan Salon, MD;  Location: [facility];  Service: Gynecology;  Laterality: N/A;   KNEE ARTHROSCOPY Left 2018    Short Social History:  Social History   Tobacco Use   Smoking status: Former    Packs/day: 1.00    Years: 5.00    Additional pack years: 0.00    Total pack years: 5.00    Types: Cigarettes    Quit date: 12/29/2018    Years since quitting: 3.2   Smokeless tobacco: Never   Tobacco comments:    stopped on 12/29/2018  Substance Use Topics   Alcohol use: Not Currently    Alcohol/week: 0.0 standard drinks of alcohol    Allergies  Allergen Reactions   Codeine Shortness Of Breath   Hydrocodone Shortness Of Breath   Iodine Other (See Comments)    Shellfish allergy   Shellfish Allergy Anaphylaxis   Aspirin Hives    Current Outpatient Medications  Medication Sig Dispense Refill   sertraline (ZOLOFT) 50 MG tablet Take 1/2 tab by mouth at bedtime for the first 4 days then increase to full tab at bedtime. 30 tablet 3   Vitamin D, Ergocalciferol, (DRISDOL) 1.25 MG (50000 UNIT) CAPS capsule Take 1 capsule (50,000 Units total) by mouth every 7 (seven) days. Take for 12 total doses(weeks) than can transition to 1000 units OTC supplement daily 12 capsule 3   EPINEPHrine 0.3 mg/0.3 mL IJ SOAJ injection Inject 0.3 mg into the muscle as needed for  anaphylaxis. (Patient not taking: Reported on 03/04/2022)     hydrOXYzine (ATARAX) 50 MG tablet Take 0.5-1 tablets (25-50 mg total) by mouth at bedtime as needed. For anxiety and sleep. (Patient not taking: Reported on 03/04/2022) 30 tablet 3   ibuprofen (ADVIL) 800 MG tablet Take 1 tablet (800 mg total) by mouth 3 (three) times daily. (Patient not taking: Reported on 03/04/2022) 90 tablet 1   No current facility-administered medications for this visit.    Review of Systems  Constitutional:  Constitutional negative. HENT: HENT negative.  Eyes: Eyes negative.  Cardiovascular: Cardiovascular negative.  GI: Gastrointestinal negative.  Musculoskeletal: Musculoskeletal negative.  Skin: Skin negative.  Neurological:       Improve left hand numbness only has shooting pains when reaching behind her Hematologic: Hematologic/lymphatic negative.  Psychiatric: Psychiatric negative.        Objective:  Objective   Vitals:   04/08/22 1100  BP: 114/81  Pulse: 92  Resp: 20  Temp: 97.8 F (36.6 C)  SpO2: 96%  Weight: 237 lb 12.8 oz (107.9 kg)  Height: 5\' 6"  (1.676 m)   Body mass index is 38.38 kg/m.  Physical Exam HENT:     Head: Normocephalic.     Nose: Nose normal.     Mouth/Throat:     Mouth: Mucous membranes are moist.  Eyes:     Pupils: Pupils are equal, round, and reactive to light.  Neck:     Comments: She does have supraclavicular tenderness to palpation on the left only Cardiovascular:     Rate and Rhythm: Normal rate.     Pulses:          Radial pulses are 2+ on the right side and 2+ on the left side.  Abdominal:     General: Abdomen is flat.     Palpations: Abdomen is soft.  Musculoskeletal:        General: Normal range of motion.     Right lower leg: No edema.     Left lower leg: No edema.  Skin:    General: Skin is warm.     Capillary Refill: Capillary refill takes less than 2 seconds.  Neurological:     General: No focal deficit present.     Mental Status:  She is alert.  Psychiatric:        Mood and Affect: Mood normal.        Thought Content: Thought content normal.        Judgment: Judgment normal.     Data: No new studies     Assessment/Plan:    42 year old female with history that is convincing for left upper extremity thoracic outlet syndrome.  She thankfully has had dramatic improvement of her  symptoms with physical therapy and dry needling at this time does not need any surgical intervention.  We have discussed that if her symptoms return the neck step would be return to physical therapy and scalene block on the left.  She would likely benefit from peripheral resection and pectoralis minor release if she requires surgery in the future.  She will otherwise see [place] on an as-needed basis.    Waynetta Sandy MD [facility]

## 2022-04-09 ENCOUNTER — Encounter: Payer: Self-pay | Admitting: Nurse Practitioner

## 2022-04-09 DIAGNOSIS — E7522 Gaucher disease: Secondary | ICD-10-CM

## 2022-04-09 DIAGNOSIS — R7989 Other specified abnormal findings of blood chemistry: Secondary | ICD-10-CM

## 2022-04-09 DIAGNOSIS — Z8349 Family history of other endocrine, nutritional and metabolic diseases: Secondary | ICD-10-CM

## 2022-04-09 DIAGNOSIS — L659 Nonscarring hair loss, unspecified: Secondary | ICD-10-CM

## 2022-04-10 ENCOUNTER — Ambulatory Visit (HOSPITAL_BASED_OUTPATIENT_CLINIC_OR_DEPARTMENT_OTHER): Payer: 59 | Admitting: Orthopaedic Surgery

## 2022-04-21 ENCOUNTER — Telehealth: Payer: Self-pay

## 2022-04-21 NOTE — Telephone Encounter (Signed)
Caller: Patient  Concern: with symptoms of TOS, all symptoms returned  Treatments:  PT  Consulted: B. Donzetta Matters, MD via staff msg, awaiting reply for next step, scalene block discussed at last visit on 04/08/22

## 2022-04-28 ENCOUNTER — Encounter: Payer: Self-pay | Admitting: Nurse Practitioner

## 2022-04-28 ENCOUNTER — Ambulatory Visit (INDEPENDENT_AMBULATORY_CARE_PROVIDER_SITE_OTHER): Payer: 59 | Admitting: Nurse Practitioner

## 2022-04-28 VITALS — BP 122/80 | HR 89 | Wt 240.6 lb

## 2022-04-28 DIAGNOSIS — H66004 Acute suppurative otitis media without spontaneous rupture of ear drum, recurrent, right ear: Secondary | ICD-10-CM | POA: Diagnosis not present

## 2022-04-28 MED ORDER — CEFTRIAXONE SODIUM 1 G IJ SOLR
1.0000 g | Freq: Once | INTRAMUSCULAR | Status: AC
  Administered 2022-04-28: 1 g via INTRAMUSCULAR

## 2022-04-28 MED ORDER — KETOROLAC TROMETHAMINE 60 MG/2ML IM SOLN
60.0000 mg | Freq: Once | INTRAMUSCULAR | Status: AC
  Administered 2022-04-28: 60 mg via INTRAMUSCULAR

## 2022-04-28 MED ORDER — METHYLPREDNISOLONE ACETATE 80 MG/ML IJ SUSP
80.0000 mg | Freq: Once | INTRAMUSCULAR | Status: AC
  Administered 2022-04-28: 60 mg via INTRAMUSCULAR

## 2022-04-28 NOTE — Progress Notes (Signed)
  Tollie Eth, DNP, AGNP-c [facility] [address] [place], [place] [address] [phone]  Subjective:   Betty Leonard is a 42 y.o. female presents to day for evaluation of: Difficulty maintaining sleep and waking up feeling unrested have been reported by the patient. Plan: - Sleep hygiene measures are recommended, including maintaining a consistent sleep schedule, establishing a relaxing bedtime routine, and ensuring the bedroom is cool and dark. - A referral to a sleep specialist may be considered if sleep disturbances persist.  PMH, Medications, and Allergies reviewed and updated in chart as appropriate.   ROS negative except for what is listed in HPI. Objective:  BP 122/80   Pulse 89   Wt 240 lb 9.6 oz (109.1 kg)   BMI 38.83 kg/m  Physical Exam Vitals and nursing note reviewed.  Constitutional:      Appearance: Normal appearance.  HENT:     Head: Normocephalic and atraumatic.     Right Ear: Swelling and tenderness present. A middle ear effusion is present. Tympanic membrane is bulging.     Left Ear: Tenderness present. A middle ear effusion is present.     Nose: Mucosal edema and congestion present.     Mouth/Throat:     Mouth: Mucous membranes are moist.     Pharynx: Posterior oropharyngeal erythema present.  Cardiovascular:     Rate and Rhythm: Normal rate and regular rhythm.     Pulses: Normal pulses.     Heart sounds: Normal heart sounds.  Pulmonary:     Effort: Pulmonary effort is normal.     Breath sounds: Normal breath sounds.  Musculoskeletal:     Cervical back: Normal range of motion.  Lymphadenopathy:     Cervical: Cervical adenopathy present.  Skin:    General: Skin is warm and dry.     Capillary Refill: Capillary refill takes less than 2 seconds.  Neurological:     General: No focal deficit present.     Mental Status: She is alert.  Psychiatric:        Mood and Affect: Mood normal.           Assessment & Plan:    Problem List Items Addressed This Visit     Recurrent acute suppurative otitis media of right ear without spontaneous rupture of tympanic membrane - Primary    The patient reports a history of recurrent ear infections and is currently experiencing pressure and discomfort in the ear. Current examination shows bulging TM on the right with bilateral effusions present.  Given that she will be flying in the near future, treatment with IM Rocephin today. Plan: -IM rocephin and methylprednisolone today - Advise the patient to take the medication with her during her trip. - Consider referral to an ENT specialist if the ear infections continue to recur.      Relevant Orders   Ambulatory referral to ENT      Tollie Eth, DNP, AGNP-c 05/04/2022  8:25 PM    History, Medications, Surgery, SDOH, and Family History reviewed and updated as appropriate.

## 2022-04-29 ENCOUNTER — Encounter: Payer: Self-pay | Admitting: Nurse Practitioner

## 2022-04-29 ENCOUNTER — Ambulatory Visit (INDEPENDENT_AMBULATORY_CARE_PROVIDER_SITE_OTHER): Payer: 59 | Admitting: Vascular Surgery

## 2022-04-29 DIAGNOSIS — G540 Brachial plexus disorders: Secondary | ICD-10-CM | POA: Diagnosis not present

## 2022-04-29 NOTE — Progress Notes (Signed)
       Virtual Visit via Telephone Note   I connected with Dara Lords on 04/29/2022 using the [email or web address] by telephone and verified that I was speaking with the correct person using two identifiers.   Chief Complaint: Left upper extremity pain and weakness  History of Present Illness: BRITNI PLETT is a 42 y.o. female with Initially evaluated for numbness of her left hand with pain shooting down the forearm with normal EMG testing and concern for thoracic outlet syndrome.  She has completed physical therapy and also dry needling and at last visit her symptoms had mostly resolved.  She has been evaluated by orthopedic surgery with thoughts that her pain is secondary to thoracic outlet syndrome and is also had MRI of her neck which did not demonstrate any abnormalities to explain her weakness and pins-and-needles symptoms.  Since she had no complaints at her last visit we elected for follow-up on an as-needed basis.  Unfortunately she states that after returning home she very quickly developed new symptoms consistent with weakness and numbness without any notable injuries.  She is now interested in pursuing the next steps.  Past Medical History:  Diagnosis Date   Anemia    iron infusion sept 2020   Chicken pox    Depression    Esophagitis 12/24/2011   GERD (gastroesophageal reflux disease)    History of wheezing 06/06/2018   Menorrhagia    with irregular cycles   Migraines    Pneumonia 07/2018   Seasonal allergies    Sinusitis 11/12/2021   UTI (urinary tract infection) finished antibiotic 04-11-2019    Past Surgical History:  Procedure Laterality Date   ESOPHAGOGASTRODUODENOSCOPY  12/24/2011   Procedure: ESOPHAGOGASTRODUODENOSCOPY (EGD);  Surgeon: Louis Meckel, MD;  Location: [facility];  Service: Endoscopy;  Laterality: N/A;   FOOT SURGERY Right 2009   repair remotely after a fracture   FOOT SURGERY Right 1'2020   HYSTEROSCOPY WITH NOVASURE N/A 04/25/2019   Procedure:  HYSTEROSCOPY WITH NOVASURE ABLATION;  Surgeon: Jerene Bears, MD;  Location: [facility];  Service: Gynecology;  Laterality: N/A;   KNEE ARTHROSCOPY Left 2018    No outpatient medications have been marked as taking for the 04/29/22 encounter (Appointment) with Maeola Harman, MD.    12 system ROS was negative unless otherwise noted in HPI   Observations/Objective: She demonstrates good understanding of our conversation today  Assessment and Plan: 42 year old female with symptoms consistent with left sided neurogenic thoracic outlet syndrome.  Symptoms had initially resolved with physical therapy.  I recommended return to physical therapy and we will set her up for left anterior scalene block.  I discussed with the patient that although her symptoms are certainly possibly secondary to thoracic outlet syndrome she also may have multifactorial issues in the left upper extremity that would not be fully treated with scalene block and certainly not with first rib resection.  We we will have further discussion after scalene block based on results and we can make a plan moving forward.  Follow Up Instructions: after scalene block    I spent 15 minutes with the patient via telephone encounter and in chart review prior to discussion.   Signed, Lemar Livings [facility] [place] Office: [phone]  04/29/2022, 4:20 PM

## 2022-05-04 NOTE — Assessment & Plan Note (Signed)
The patient reports a history of recurrent ear infections and is currently experiencing pressure and discomfort in the ear. Current examination shows bulging TM on the right with bilateral effusions present.  Given that she will be flying in the near future, treatment with IM Rocephin today. Plan: -IM rocephin and methylprednisolone today - Advise the patient to take the medication with her during her trip. - Consider referral to an ENT specialist if the ear infections continue to recur.

## 2022-05-06 ENCOUNTER — Other Ambulatory Visit: Payer: Self-pay

## 2022-05-06 DIAGNOSIS — G540 Brachial plexus disorders: Secondary | ICD-10-CM

## 2022-05-07 ENCOUNTER — Encounter: Payer: Self-pay | Admitting: Vascular Surgery

## 2022-05-07 ENCOUNTER — Telehealth: Payer: Self-pay | Admitting: Vascular Surgery

## 2022-05-18 ENCOUNTER — Ambulatory Visit (INDEPENDENT_AMBULATORY_CARE_PROVIDER_SITE_OTHER): Payer: 59 | Admitting: Obstetrics & Gynecology

## 2022-05-18 ENCOUNTER — Encounter (HOSPITAL_BASED_OUTPATIENT_CLINIC_OR_DEPARTMENT_OTHER): Payer: Self-pay | Admitting: Obstetrics & Gynecology

## 2022-05-18 ENCOUNTER — Other Ambulatory Visit (HOSPITAL_COMMUNITY)
Admission: RE | Admit: 2022-05-18 | Discharge: 2022-05-18 | Disposition: A | Discharge status: Home / Self Care | Payer: 59 | Source: Ambulatory Visit | Attending: Obstetrics & Gynecology | Admitting: Obstetrics & Gynecology

## 2022-05-18 VITALS — BP 110/79 | HR 89 | Ht 66.0 in | Wt 234.6 lb

## 2022-05-18 DIAGNOSIS — Z124 Encounter for screening for malignant neoplasm of cervix: Secondary | ICD-10-CM | POA: Insufficient documentation

## 2022-05-18 DIAGNOSIS — N3090 Cystitis, unspecified without hematuria: Secondary | ICD-10-CM

## 2022-05-18 DIAGNOSIS — F419 Anxiety disorder, unspecified: Secondary | ICD-10-CM

## 2022-05-18 DIAGNOSIS — F32A Depression, unspecified: Secondary | ICD-10-CM

## 2022-05-18 DIAGNOSIS — Z01419 Encounter for gynecological examination (general) (routine) without abnormal findings: Secondary | ICD-10-CM

## 2022-05-18 DIAGNOSIS — Z8742 Personal history of other diseases of the female genital tract: Secondary | ICD-10-CM | POA: Diagnosis not present

## 2022-05-18 DIAGNOSIS — Z1231 Encounter for screening mammogram for malignant neoplasm of breast: Secondary | ICD-10-CM

## 2022-05-18 NOTE — Progress Notes (Signed)
42 y.o. G0P0000 Single White or Caucasian female here for annual exam.  H/o iron deficiency anemia.  Was treated with hysteroscopy and novasure ablation.  Really hasn't had any bleeding since then.  Hb last summer and iron levels were normal.  Ferritin was elevated.  Iron infusions were stopped.    No LMP recorded. Patient has had an ablation.          Sexually active: No.  The current method of family planning is none.     Upstream - 05/18/22 1547       Pregnancy Intention Screening   Does the patient want to become pregnant in the next year? No    Does the patient's partner want to become pregnant in the next year? N/A    Would the patient like to discuss contraceptive options today? No      Contraception Wrap Up   Current Method No Contraceptive Precautions            Smoker:  no  Health Maintenance: Pap:  11/26/19 neg History of abnormal Pap:  h/o HR HPV MMG:  guidelines reviewed.  Ordered placed.   Colonoscopy:  guidelines reviewed BMD:   not indicated Screening Labs: done 05/2021 with [facility]    reports that she quit smoking about 3 years ago. Her smoking use included cigarettes. She has a 5.00 pack-year smoking history. She has never used smokeless tobacco. She reports that she does not currently use alcohol. She reports that she does not use drugs.  Past Medical History:  Diagnosis Date   Anemia    iron infusion sept 2020   Chicken pox    Depression    Esophagitis 12/24/2011   GERD (gastroesophageal reflux disease)    History of wheezing 06/06/2018   Menorrhagia    with irregular cycles   Migraines    Pneumonia 07/2018   Seasonal allergies    Sinusitis 11/12/2021   UTI (urinary tract infection) finished antibiotic 04-11-2019    Past Surgical History:  Procedure Laterality Date   ESOPHAGOGASTRODUODENOSCOPY  12/24/2011   Procedure: ESOPHAGOGASTRODUODENOSCOPY (EGD);  Surgeon: Louis Meckel, MD;  Location: [facility];  Service: Endoscopy;  Laterality: N/A;    FOOT SURGERY Right 2009   repair remotely after a fracture   FOOT SURGERY Right 1'2020   FOOT SURGERY Right 2023   HYSTEROSCOPY WITH NOVASURE N/A 04/25/2019   Procedure: HYSTEROSCOPY WITH NOVASURE ABLATION;  Surgeon: Jerene Bears, MD;  Location: [facility];  Service: Gynecology;  Laterality: N/A;   KNEE ARTHROSCOPY Left 2018    Current Outpatient Medications  Medication Sig Dispense Refill   EPINEPHrine 0.3 mg/0.3 mL IJ SOAJ injection Inject 0.3 mg into the muscle as needed for anaphylaxis.     Vitamin D, Ergocalciferol, (DRISDOL) 1.25 MG (50000 UNIT) CAPS capsule Take 1 capsule (50,000 Units total) by mouth every 7 (seven) days. Take for 12 total doses(weeks) than can transition to 1000 units OTC supplement daily 12 capsule 3   hydrOXYzine (ATARAX) 50 MG tablet Take 0.5-1 tablets (25-50 mg total) by mouth at bedtime as needed. For anxiety and sleep. (Patient not taking: Reported on 03/04/2022) 30 tablet 3   ibuprofen (ADVIL) 800 MG tablet Take 1 tablet (800 mg total) by mouth 3 (three) times daily. (Patient not taking: Reported on 03/04/2022) 90 tablet 1   sertraline (ZOLOFT) 50 MG tablet Take 1/2 tab by mouth at bedtime for the first 4 days then increase to full tab at bedtime. (Patient not taking: Reported on  05/18/2022) 30 tablet 3   No current facility-administered medications for this visit.    Family History  Problem Relation Age of Onset   Leukemia Paternal Grandfather    Lung cancer Paternal Grandfather    Arthritis Paternal Grandfather    Diabetes Paternal Grandfather    Heart disease Paternal Grandfather    Hypertension Paternal Grandfather    Kidney disease Paternal Grandfather    Prostate cancer Paternal Grandfather    Gaucher's disease Paternal Grandfather    Sudden death Father        age 42, nothing found on autopsy   Diabetes Father    Hyperlipidemia Father    Hypertension Father    Mental illness Mother        bipolar, schizophrenia   Alcohol  abuse Maternal Grandfather    Stroke Paternal Grandmother    Colon cancer Neg Hx     ROS: Constitutional: negative Genitourinary:negative  Exam:   BP 110/79   Pulse 89   Ht 5\' 6"  (1.676 m)   Wt 234 lb 9.6 oz (106.4 kg)   BMI 37.87 kg/m   Height: 5\' 6"  (167.6 cm)  General appearance: alert, cooperative and appears stated age Head: Normocephalic, without obvious abnormality, atraumatic Neck: no adenopathy, supple, symmetrical, trachea midline and thyroid normal to inspection and palpation Lungs: clear to auscultation bilaterally Breasts: normal appearance, no masses or tenderness Heart: regular rate and rhythm Abdomen: soft, non-tender; bowel sounds normal; no masses,  no organomegaly Extremities: extremities normal, atraumatic, no cyanosis or edema Skin: Skin color, texture, turgor normal. No rashes or lesions Lymph nodes: Cervical, supraclavicular, and axillary nodes normal. No abnormal inguinal nodes palpated Neurologic: Grossly normal   Pelvic: External genitalia:  no lesions              Urethra:  normal appearing urethra with no masses, tenderness or lesions              Bartholins and Skenes: normal                 Vagina: normal appearing vagina with normal color and no discharge, no lesions              Cervix: no lesions              Pap taken: Yes.   Bimanual Exam:  Uterus:  normal size, contour, position, consistency, mobility, non-tender              Adnexa: no mass, fullness, tenderness               Rectovaginal: Confirms               Anus:  normal sphincter tone, no lesions  Chaperone, Raechel Ache, RN, was present for exam.  Assessment/Plan:  1. Well woman exam with routine gynecological exam - Pap smear obtained today - Mammogram order placed.  Guidelines reviewed. - Colonoscopy guidelines reviewed - lab work done with PCP, [facility] Early - vaccines reviewed/updated  2. Cervical cancer screening - Cytology - PAP( [facility])  3. Encounter for  screening mammogram for malignant neoplasm of breast - MM 3D SCREENING MAMMOGRAM BILATERAL BREAST; Future  4. Anxiety and depression - on zoloft but stopped to see if really doing something  5. 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 Recurrent cystitis - has seen Dr. Arita Miss  7. History of menorrhagia - resolved after endometrial ablation

## 2022-05-19 ENCOUNTER — Other Ambulatory Visit (HOSPITAL_BASED_OUTPATIENT_CLINIC_OR_DEPARTMENT_OTHER): Payer: Self-pay

## 2022-05-19 ENCOUNTER — Encounter: Payer: Self-pay | Admitting: Nurse Practitioner

## 2022-05-19 ENCOUNTER — Telehealth (INDEPENDENT_AMBULATORY_CARE_PROVIDER_SITE_OTHER): Payer: 59 | Admitting: Nurse Practitioner

## 2022-05-19 VITALS — Wt 220.0 lb

## 2022-05-19 DIAGNOSIS — Z6837 Body mass index (BMI) 37.0-37.9, adult: Secondary | ICD-10-CM

## 2022-05-19 DIAGNOSIS — F419 Anxiety disorder, unspecified: Secondary | ICD-10-CM | POA: Diagnosis not present

## 2022-05-19 DIAGNOSIS — E669 Obesity, unspecified: Secondary | ICD-10-CM | POA: Diagnosis not present

## 2022-05-19 DIAGNOSIS — D500 Iron deficiency anemia secondary to blood loss (chronic): Secondary | ICD-10-CM | POA: Diagnosis not present

## 2022-05-19 DIAGNOSIS — E559 Vitamin D deficiency, unspecified: Secondary | ICD-10-CM

## 2022-05-19 DIAGNOSIS — F32A Depression, unspecified: Secondary | ICD-10-CM

## 2022-05-19 DIAGNOSIS — Z0000 Encounter for general adult medical examination without abnormal findings: Secondary | ICD-10-CM

## 2022-05-19 DIAGNOSIS — R7989 Other specified abnormal findings of blood chemistry: Secondary | ICD-10-CM

## 2022-05-19 MED ORDER — SERTRALINE HCL 50 MG PO TABS
50.0000 mg | ORAL_TABLET | Freq: Every day | ORAL | 3 refills | Status: DC
  Filled 2022-05-19 – 2022-05-29 (×2): qty 30, 30d supply, fill #0

## 2022-05-19 NOTE — Progress Notes (Signed)
Virtual Visit Encounter mychart visit.   I connected with  Betty Leonard on 05/19/22 at  9:00 AM EDT by secure video and audio telemedicine application. I verified that I am speaking with the correct person using two identifiers.   I introduced myself as a Publishing rights manager with the practice. The limitations of evaluation and management by telemedicine discussed with the patient and the availability of in person appointments. The patient expressed verbal understanding and consent to proceed.  Participating parties in this visit include: Myself and patient  The patient is: Patient Location: Home I am: Provider Location: Office/Clinic Subjective:    CC and HPI: Betty Leonard is a 42 y.o. year old female presenting for follow up of mood.  Betty Leonard has been on sertraline recently for anxiety and depressive symptoms. She reports having good control with the medication. She tells me she made the decision to stop the medication about a week ago after feeling very defeated over not getting needed answers for many symptoms she has been experiencing through the genetic testing. She tells me she wasn't sure if the medication was working and decided to stop.   Since stopping the medication, she tells me she feels as though, "the world is crumbling". She tells me that she is dealing with a range of emotions over not having answers and this has now spread into every day life. She feels that restarting the sertraline will definitely benefit her.   Additionally, Betty Leonard is interested in pursuing weight loss assistance, but is not interested in medication options at this time. She would be interested in seeing healthy weight and wellness for an evaluation.   Betty Leonard also requests routine labs today.     Past medical history, Surgical history, Family history not pertinant except as noted below, Social history, Allergies, and medications have been entered into the medical record, reviewed, and corrections made.    Review of Systems:  All review of systems negative except what is listed in the HPI  Objective:    Alert and oriented x 4 Speaking in clear sentences with no shortness of breath. No distress.  Impression and Recommendations:    Problem List Items Addressed This Visit     Anxiety and depression - Primary    Exacerbation of symptoms with cessation of sertraline about a week ago. She has been experiencing worsening mood and emotional instability. I do recommend restarting the medication at 25mg  and consider increase dose to 50mg  as needed.  Plan: - Sertraline has been sent to the pharmacy - Start at 25mg  and increase if you feel the dose is not enough up to the full tablet of 50mg .  - Please reach out to me if the symptoms are not improved.       Relevant Medications   sertraline (ZOLOFT) 50 MG tablet   Other Relevant Orders   Hemoglobin A1c   CBC with Differential/Platelet   Comprehensive metabolic panel   Lipid panel   TSH   Vitamin B12   VITAMIN D 25 Hydroxy (Vit-D Deficiency, Fractures)   Iron, TIBC and Ferritin Panel   Iron deficiency anemia due to chronic blood loss    Labs pending      Relevant Orders   Hemoglobin A1c   CBC with Differential/Platelet   Comprehensive metabolic panel   Lipid panel   TSH   Vitamin B12   VITAMIN D 25 Hydroxy (Vit-D Deficiency, Fractures)   Iron, TIBC and Ferritin Panel   Obesity    Discussion of  weight management with Merian today with specific diet and exercise recommendations provided on AVS. She is not interested in medication management, but would like help with accountability and making sure her choices are appropriate. We discussed HWW program with [facility] and she is interested in this.  Plan: - Referral made to healthy weight and wellness. They will call to schedule.  - Read through the recommendations on your after visit summary and let me know if you have any questions or concerns.       Relevant Orders   Amb Ref to  Medical Weight Management   Hemoglobin A1c   CBC with Differential/Platelet   Comprehensive metabolic panel   Lipid panel   TSH   Vitamin B12   VITAMIN D 25 Hydroxy (Vit-D Deficiency, Fractures)   Iron, TIBC and Ferritin Panel   Elevated ferritin   Relevant Orders   Hemoglobin A1c   CBC with Differential/Platelet   Comprehensive metabolic panel   Lipid panel   TSH   Vitamin B12   VITAMIN D 25 Hydroxy (Vit-D Deficiency, Fractures)   Iron, TIBC and Ferritin Panel   Other Visit Diagnoses     Health care maintenance       Relevant Orders   Hemoglobin A1c   CBC with Differential/Platelet   Comprehensive metabolic panel   Lipid panel   TSH   Vitamin B12   VITAMIN D 25 Hydroxy (Vit-D Deficiency, Fractures)   Iron, TIBC and Ferritin Panel   Vitamin D deficiency       Relevant Orders   Hemoglobin A1c   CBC with Differential/Platelet   Comprehensive metabolic panel   Lipid panel   TSH   Vitamin B12   VITAMIN D 25 Hydroxy (Vit-D Deficiency, Fractures)   Iron, TIBC and Ferritin Panel       orders and follow up as documented in EMR I discussed the assessment and treatment plan with the patient. The patient was provided an opportunity to ask questions and all were answered. The patient agreed with the plan and demonstrated an understanding of the instructions.   The patient was advised to call back or seek an in-person evaluation if the symptoms worsen or if the condition fails to improve as anticipated.  Follow-Up: in 6 months  I provided 23 minutes of non-face-to-face interaction with this non face-to-face encounter including intake, same-day documentation, and chart review.   Tollie Eth, NP , DNP, AGNP-c [facility]

## 2022-05-19 NOTE — Assessment & Plan Note (Signed)
Exacerbation of symptoms with cessation of sertraline about a week ago. She has been experiencing worsening mood and emotional instability. I do recommend restarting the medication at 25mg  and consider increase dose to 50mg  as needed.  Plan: - Sertraline has been sent to the pharmacy - Start at 25mg  and increase if you feel the dose is not enough up to the full tablet of 50mg .  - Please reach out to me if the symptoms are not improved.

## 2022-05-19 NOTE — Assessment & Plan Note (Signed)
Labs pending.  

## 2022-05-19 NOTE — Patient Instructions (Signed)
WEIGHT LOSS PLANNING Your progress today shows:     05/19/2022    8:51 AM 05/18/2022    3:47 PM 04/28/2022    3:28 PM  Vitals with BMI  Height  5\' 6"    Weight 220 lbs 234 lbs 10 oz 240 lbs 10 oz  BMI 35.53 37.88 38.85  Systolic  110 122  Diastolic  79 80  Pulse  89 89    For best management of weight, it is vital to balance intake versus output. This means the number of calories burned per day must be less than the calories you take in with food and drink.   I recommend trying to follow a diet with the following: Calories: 1200-1500 calories per day Carbohydrates: 150-180 grams of carbohydrates per day  Why: Gives your body enough "quick fuel" for cells to maintain normal function without sending them into starvation mode.  Protein: At least 90 grams of protein per day- 30 grams with each meal Why: Protein takes longer and uses more energy than carbohydrates to break down for fuel. The carbohydrates in your meals serves as quick energy sources and proteins help use some of that extra quick energy to break down to produce long term energy. This helps you not feel hungry as quickly and protein breakdown burns calories.  Water: Drink AT LEAST 64 ounces of water per day  Why: Water is essential to healthy metabolism. Water helps to fill the stomach and keep you fuller longer. Water is required for healthy digestion and filtering of waste in the body.  Fat: Limit fats in your diet- when choosing fats, choose foods with lower fats content such as lean meats (chicken, fish, [place]).  Why: Increased fat intake leads to storage "for later". Once you burn your carbohydrate energy, your body goes into fat and protein breakdown mode to help you loose weight.  Cholesterol: Fats and oils that are LIQUID at room temperature are best. Choose vegetable oils (olive oil, avocado oil, nuts). Avoid fats that are SOLID at room temperature (animal fats, processed meats). Healthy fats are often found in whole  grains, beans, nuts, seeds, and berries.  Why: Elevated cholesterol levels lead to build up of cholesterol on the inside of your blood vessels. This will eventually cause the blood vessels to become hard and can lead to high blood pressure and damage to your organs. When the blood flow is reduced, but the pressure is high from cholesterol buildup, parts of the cholesterol can break off and form clots that can go to the brain or heart leading to a stroke or heart attack.  Fiber: Increase amount of SOLUBLE the fiber in your diet. This helps to fill you up, lowers cholesterol, and helps with digestion. Some foods high in soluble fiber are oats, peas, beans, apples, carrots, barley, and citrus fruits.   Why: Fiber fills you up, helps remove excess cholesterol, and aids in healthy digestion which are all very important in weight management.   I recommend the following as a minimum activity routine: Purposeful walk or other physical activity at least 20 minutes every single day. This means purposefully taking a walk, jog, bike, swim, treadmill, elliptical, dance, etc.  This activity should be ABOVE your normal daily activities, such as walking at work. Goal exercise should be at least 150 minutes a week- work your way up to this.   Heart Rate: Your maximum exercise heart rate should be 220 - Your Age in Years. When exercising, get your heart rate  up, but avoid going over the maximum targeted heart rate.  60-70% of your maximum heart rate is where you tend to burn the most fat. To find this number:  220 - Age In Years= Max HR  Max HR x 0.6 (or 0.7) = Fat Burning HR The Fat Burning HR is your goal heart rate while working out to burn the most fat.  NEVER exercise to the point your feel lightheaded, weak, nauseated, dizzy. If you experience ANY of these symptoms- STOP exercise! Allow yourself to cool down and your heart rate to come down. Then restart slower next time.  If at [place] you feel chest pain  or chest pressure during exercise, STOP IMMEDIATELY and seek medical attention.

## 2022-05-19 NOTE — Assessment & Plan Note (Signed)
Discussion of weight management with [name] today with specific diet and exercise recommendations provided on AVS. She is not interested in medication management, but would like help with accountability and making sure her choices are appropriate. We discussed HWW program with [facility] and she is interested in this.  Plan: - Referral made to healthy weight and wellness. They will call to schedule.  - Read through the recommendations on your after visit summary and let me know if you have any questions or concerns.

## 2022-05-20 ENCOUNTER — Other Ambulatory Visit: Payer: 59

## 2022-05-20 DIAGNOSIS — R7989 Other specified abnormal findings of blood chemistry: Secondary | ICD-10-CM

## 2022-05-20 DIAGNOSIS — F32A Depression, unspecified: Secondary | ICD-10-CM

## 2022-05-20 DIAGNOSIS — E669 Obesity, unspecified: Secondary | ICD-10-CM

## 2022-05-20 DIAGNOSIS — Z0000 Encounter for general adult medical examination without abnormal findings: Secondary | ICD-10-CM

## 2022-05-20 DIAGNOSIS — E559 Vitamin D deficiency, unspecified: Secondary | ICD-10-CM

## 2022-05-20 DIAGNOSIS — D500 Iron deficiency anemia secondary to blood loss (chronic): Secondary | ICD-10-CM

## 2022-05-20 LAB — COMPREHENSIVE METABOLIC PANEL

## 2022-05-20 LAB — LIPID PANEL

## 2022-05-20 LAB — CYTOLOGY - PAP
Comment: NEGATIVE
Diagnosis: NEGATIVE
High risk HPV: NEGATIVE

## 2022-05-20 LAB — HEMOGLOBIN A1C

## 2022-05-20 LAB — CBC WITH DIFFERENTIAL/PLATELET
Basophils Absolute: 0 10*3/uL (ref 0.0–0.2)
EOS (ABSOLUTE): 0.1 10*3/uL (ref 0.0–0.4)
Lymphocytes Absolute: 2.5 10*3/uL (ref 0.7–3.1)
Monocytes: 7 %
RBC: 5.21 x10E6/uL (ref 3.77–5.28)

## 2022-05-21 LAB — COMPREHENSIVE METABOLIC PANEL
ALT: 9 IU/L (ref 0–32)
Albumin: 4.4 g/dL (ref 3.9–4.9)
BUN/Creatinine Ratio: 20 (ref 9–23)
BUN: 14 mg/dL (ref 6–24)
Calcium: 9.8 mg/dL (ref 8.7–10.2)
Globulin, Total: 2.6 g/dL (ref 1.5–4.5)
Total Protein: 7 g/dL (ref 6.0–8.5)
eGFR: 112 mL/min/{1.73_m2} (ref >59)

## 2022-05-21 LAB — LIPID PANEL
Chol/HDL Ratio: 3.7 ratio (ref 0.0–4.4)
Cholesterol, Total: 191 mg/dL (ref 100–199)
HDL: 51 mg/dL (ref >39)
LDL Chol Calc (NIH): 117 mg/dL — ABNORMAL HIGH (ref 0–99)
Triglycerides: 127 mg/dL (ref 0–149)

## 2022-05-21 LAB — VITAMIN B12: Vitamin B-12: 496 pg/mL (ref 232–1245)

## 2022-05-21 LAB — IRON,TIBC AND FERRITIN PANEL
Ferritin: 299 ng/mL — ABNORMAL HIGH (ref 15–150)
Iron Saturation: 27 % (ref 15–55)
Iron: 71 ug/dL (ref 27–159)
Total Iron Binding Capacity: 260 ug/dL (ref 250–450)
UIBC: 189 ug/dL (ref 131–425)

## 2022-05-21 LAB — CBC WITH DIFFERENTIAL/PLATELET
Basos: 0 %
Eos: 1 %
Hematocrit: 44 % (ref 34.0–46.6)
Hemoglobin: 14.3 g/dL (ref 11.1–15.9)
Immature Grans (Abs): 0 10*3/uL (ref 0.0–0.1)
Immature Granulocytes: 0 %
Lymphs: 27 %
MCH: 27.4 pg (ref 26.6–33.0)
MCHC: 32.5 g/dL (ref 31.5–35.7)
MCV: 85 fL (ref 79–97)
Monocytes Absolute: 0.7 10*3/uL (ref 0.1–0.9)
Neutrophils Absolute: 5.9 10*3/uL (ref 1.4–7.0)
Neutrophils: 65 %
Platelets: 390 10*3/uL (ref 150–450)
RDW: 12.9 % (ref 11.7–15.4)
WBC: 9.2 10*3/uL (ref 3.4–10.8)

## 2022-05-21 LAB — TSH: TSH: 0.849 u[IU]/mL (ref 0.450–4.500)

## 2022-05-21 LAB — VITAMIN D 25 HYDROXY (VIT D DEFICIENCY, FRACTURES): Vit D, 25-Hydroxy: 13.7 ng/mL — ABNORMAL LOW (ref 30.0–100.0)

## 2022-05-25 ENCOUNTER — Other Ambulatory Visit: Payer: 59

## 2022-05-25 ENCOUNTER — Encounter (HOSPITAL_BASED_OUTPATIENT_CLINIC_OR_DEPARTMENT_OTHER): Payer: Self-pay | Admitting: Obstetrics & Gynecology

## 2022-05-25 ENCOUNTER — Other Ambulatory Visit: Payer: Self-pay

## 2022-05-25 DIAGNOSIS — E559 Vitamin D deficiency, unspecified: Secondary | ICD-10-CM

## 2022-05-25 DIAGNOSIS — E669 Obesity, unspecified: Secondary | ICD-10-CM

## 2022-05-26 LAB — PARATHYROID HORMONE, INTACT (NO CA): PTH: 25 pg/mL (ref 15–65)

## 2022-05-27 ENCOUNTER — Other Ambulatory Visit: Payer: Self-pay | Admitting: Nurse Practitioner

## 2022-05-27 ENCOUNTER — Other Ambulatory Visit (HOSPITAL_BASED_OUTPATIENT_CLINIC_OR_DEPARTMENT_OTHER): Payer: Self-pay

## 2022-05-27 DIAGNOSIS — E559 Vitamin D deficiency, unspecified: Secondary | ICD-10-CM

## 2022-05-27 MED ORDER — VITAMIN D (ERGOCALCIFEROL) 1.25 MG (50000 UNIT) PO CAPS
ORAL_CAPSULE | ORAL | 3 refills | Status: DC
Start: 2022-05-27 — End: 2023-04-13
  Filled 2022-05-27: qty 8, 24d supply, fill #0
  Filled 2022-05-29: qty 8, 28d supply, fill #0
  Filled 2022-07-08: qty 8, 28d supply, fill #1
  Filled 2023-03-08 (×2): qty 8, 28d supply, fill #2

## 2022-05-27 MED ORDER — VITAMIN D (ERGOCALCIFEROL) 1.25 MG (50000 UNIT) PO CAPS
ORAL_CAPSULE | ORAL | 3 refills | Status: DC
Start: 2022-05-27 — End: 2022-05-27
  Filled 2022-05-27: qty 24, fill #0

## 2022-05-29 ENCOUNTER — Other Ambulatory Visit (HOSPITAL_BASED_OUTPATIENT_CLINIC_OR_DEPARTMENT_OTHER): Payer: Self-pay

## 2022-05-30 ENCOUNTER — Other Ambulatory Visit (HOSPITAL_BASED_OUTPATIENT_CLINIC_OR_DEPARTMENT_OTHER): Payer: Self-pay

## 2022-05-31 ENCOUNTER — Ambulatory Visit (HOSPITAL_BASED_OUTPATIENT_CLINIC_OR_DEPARTMENT_OTHER)
Admission: RE | Admit: 2022-05-31 | Discharge: 2022-05-31 | Disposition: A | Discharge status: Home / Self Care | Payer: 59 | Source: Ambulatory Visit | Attending: Obstetrics & Gynecology | Admitting: Obstetrics & Gynecology

## 2022-05-31 ENCOUNTER — Other Ambulatory Visit (HOSPITAL_BASED_OUTPATIENT_CLINIC_OR_DEPARTMENT_OTHER): Payer: Self-pay

## 2022-05-31 DIAGNOSIS — Z1231 Encounter for screening mammogram for malignant neoplasm of breast: Secondary | ICD-10-CM

## 2022-06-12 ENCOUNTER — Ambulatory Visit (HOSPITAL_BASED_OUTPATIENT_CLINIC_OR_DEPARTMENT_OTHER): Payer: 59

## 2022-06-12 ENCOUNTER — Ambulatory Visit (INDEPENDENT_AMBULATORY_CARE_PROVIDER_SITE_OTHER): Payer: 59 | Admitting: Student

## 2022-06-12 ENCOUNTER — Encounter (HOSPITAL_BASED_OUTPATIENT_CLINIC_OR_DEPARTMENT_OTHER): Payer: Self-pay | Admitting: Student

## 2022-06-12 ENCOUNTER — Other Ambulatory Visit (HOSPITAL_BASED_OUTPATIENT_CLINIC_OR_DEPARTMENT_OTHER): Payer: Self-pay

## 2022-06-12 DIAGNOSIS — M25551 Pain in right hip: Secondary | ICD-10-CM

## 2022-06-12 MED ORDER — METHYLPREDNISOLONE 4 MG PO TBPK
ORAL_TABLET | ORAL | 0 refills | Status: DC
  Filled 2022-06-12: qty 21, 6d supply, fill #0

## 2022-06-12 NOTE — Progress Notes (Signed)
Chief Complaint: Right hip pain     History of Present Illness:    Betty Leonard is a 42 y.o. female presents today for acute right hip pain.  This began yesterday while bending forward to pull up her left pant leg.  At this point she felt what she describes as an explosion in her right hip that was immediately painful.  Pain has become severe and she has difficulty with walking and weightbearing.  Most of her pain is located in the lateral hip and occasionally radiates down to the knee or towards her back.  She reports that she is most comfortable in the supine position.  Has tried heating pad as well as Advil.  She is here today for further evaluation.   Surgical History:   None  PMH/PSH/Family History/Social History/Meds/Allergies:    Past Medical History:  Diagnosis Date   Anemia    iron infusion sept 2020   Chicken pox    Depression    Esophagitis 12/24/2011   GERD (gastroesophageal reflux disease)    History of wheezing 06/06/2018   Menorrhagia    with irregular cycles   Migraines    Pneumonia 07/2018   Recurrent acute suppurative otitis media of right ear without spontaneous rupture of tympanic membrane 08/05/2021   Seasonal allergies    Sinusitis 11/12/2021   UTI (urinary tract infection) finished antibiotic 04-11-2019   Vertigo 10/05/2021   Past Surgical History:  Procedure Laterality Date   ESOPHAGOGASTRODUODENOSCOPY  12/24/2011   Procedure: ESOPHAGOGASTRODUODENOSCOPY (EGD);  Surgeon: Louis Meckel, MD;  Location: [facility];  Service: Endoscopy;  Laterality: N/A;   FOOT SURGERY Right 2009   repair remotely after a fracture   FOOT SURGERY Right 1'2020   FOOT SURGERY Right 2023   HYSTEROSCOPY WITH NOVASURE N/A 04/25/2019   Procedure: HYSTEROSCOPY WITH NOVASURE ABLATION;  Surgeon: Jerene Bears, MD;  Location: [facility];  Service: Gynecology;  Laterality: N/A;   KNEE ARTHROSCOPY Left 2018   Social History    Socioeconomic History   Marital status: Single    Spouse name: Not on file   Number of children: Not on file   Years of education: Not on file   Highest education level: Not on file  Occupational History   Not on file  Tobacco Use   Smoking status: Former    Packs/day: 1.00    Years: 5.00    Additional pack years: 0.00    Total pack years: 5.00    Types: Cigarettes    Quit date: 12/29/2018    Years since quitting: 3.4   Smokeless tobacco: Never   Tobacco comments:    stopped on 12/29/2018  Vaping Use   Vaping Use: Never used  Substance and Sexual Activity   Alcohol use: Not Currently    Alcohol/week: 0.0 standard drinks of alcohol   Drug use: No   Sexual activity: Not Currently    Birth control/protection: Abstinence  Other Topics Concern   Not on file  Social History Narrative   Work or Holiday representative, AG[employer]      Home Situation: lives with roomate      Spiritual Beliefs: Christian      Lifestyle: CV exercise ( ) 3 days per week; diet is poor  Social Determinants of Health   Financial Resource Strain: Not on file  Food Insecurity: No Food Insecurity (12/06/2019)   Hunger Vital Sign    Worried About Running Out of Food in the Last Year: Never true    Ran Out of Food in the Last Year: Never true  Transportation Needs: Not on file  Physical Activity: Not on file  Stress: Not on file  Social Connections: Not on file   Family History  Problem Relation Age of Onset   Leukemia Paternal Grandfather    Lung cancer Paternal Grandfather    Arthritis Paternal Grandfather    Diabetes Paternal Grandfather    Heart disease Paternal Grandfather    Hypertension Paternal Grandfather    Kidney disease Paternal Grandfather    Prostate cancer Paternal Grandfather    Gaucher's disease Paternal Grandfather    Sudden death Father        age 42, nothing found on autopsy   Diabetes Father    Hyperlipidemia Father    Hypertension Father    Mental  illness Mother        bipolar, schizophrenia   Alcohol abuse Maternal Grandfather    Stroke Paternal Grandmother    Colon cancer Neg Hx    Allergies  Allergen Reactions   Codeine Shortness Of Breath   Hydrocodone Shortness Of Breath   Iodine Other (See Comments)    Shellfish allergy   Shellfish Allergy Anaphylaxis   Aspirin Hives   Current Outpatient Medications  Medication Sig Dispense Refill   methylPREDNISolone (MEDROL DOSEPAK) 4 MG TBPK tablet Take per packet instructions 21 tablet 0   EPINEPHrine 0.3 mg/0.3 mL IJ SOAJ injection Inject 0.3 mg into the muscle as needed for anaphylaxis.     hydrOXYzine (ATARAX) 50 MG tablet Take 0.5-1 tablets (25-50 mg total) by mouth at bedtime as needed. For anxiety and sleep. (Patient not taking: Reported on 05/19/2022) 30 tablet 3   sertraline (ZOLOFT) 50 MG tablet Take 1 tablet (50 mg total) by mouth at bedtime. 90 tablet 3   Vitamin D, Ergocalciferol, (DRISDOL) 1.25 MG (50000 UNIT) CAPS capsule Take 1 capsule twice a week for vitamin D deficiency. 24 capsule 3   No current facility-administered medications for this visit.   No results found.  Review of Systems:   A ROS was performed including pertinent positives and negatives as documented in the HPI.  Physical Exam :   Constitutional: NAD and appears stated age Neurological: Alert and oriented Psych: Appropriate affect and cooperative There were no vitals taken for this visit.   Comprehensive Musculoskeletal Exam:    Patient is seated rather uncomfortably with her slightly internally rotated.  Tenderness palpation over the greater trochanter.  Passive range of motion of the right hip to 90 degrees flexion and 10 degrees of external and internal rotation.  This is compared to 120 degrees flexion 30 degrees external rotation and 20 degrees internal rotation of the left hip.  There is weakness with resisted abduction.  Imaging:   Xray (right hip 4 views): Negative   I personally  reviewed and interpreted the radiographs.   Assessment:   42 y.o. female with acute right hip pain that began yesterday.  She does have decreased motion on exam with flexion and most notably with external rotation.  Based on her presentation I would be suspicious of a gluteal tendon injury as opposed to labral tear or trochanteric bursitis.  After discussing different treatment options, patient would like to proceed with Medrol Dosepak for  symptomatic relief and would like to assess progress over the weekend.  She will let me know next week whether she would like to proceed with MRI for further investigation or would like to try physical therapy which I think are both reasonable options at this time.  Plan :    -Return to clinic as needed     I personally saw and evaluated the patient, and participated in the management and treatment plan.  Hazle Nordmann, PA-C Orthopedics  This document was dictated using Conservation officer, historic buildings. A reasonable attempt at proof reading has been made to minimize errors.

## 2022-06-15 ENCOUNTER — Encounter (HOSPITAL_BASED_OUTPATIENT_CLINIC_OR_DEPARTMENT_OTHER): Payer: Self-pay

## 2022-06-15 DIAGNOSIS — M25551 Pain in right hip: Secondary | ICD-10-CM

## 2022-06-16 NOTE — Telephone Encounter (Signed)
Mri ordered 

## 2022-06-17 ENCOUNTER — Ambulatory Visit (HOSPITAL_COMMUNITY)
Admission: RE | Admit: 2022-06-17 | Discharge: 2022-06-17 | Disposition: A | Discharge status: Home / Self Care | Payer: 59 | Source: Ambulatory Visit | Attending: Student | Admitting: Student

## 2022-06-17 DIAGNOSIS — M25551 Pain in right hip: Secondary | ICD-10-CM | POA: Diagnosis present

## 2022-06-24 ENCOUNTER — Encounter (HOSPITAL_BASED_OUTPATIENT_CLINIC_OR_DEPARTMENT_OTHER): Payer: Self-pay | Admitting: Orthopaedic Surgery

## 2022-06-24 ENCOUNTER — Encounter: Payer: Self-pay | Admitting: Physical Therapy

## 2022-06-24 ENCOUNTER — Ambulatory Visit (HOSPITAL_COMMUNITY): Admission: RE | Admit: 2022-06-24 | Payer: 59 | Source: Ambulatory Visit

## 2022-06-24 ENCOUNTER — Ambulatory Visit (INDEPENDENT_AMBULATORY_CARE_PROVIDER_SITE_OTHER): Payer: 59 | Admitting: Orthopaedic Surgery

## 2022-06-24 ENCOUNTER — Ambulatory Visit (INDEPENDENT_AMBULATORY_CARE_PROVIDER_SITE_OTHER): Payer: 59 | Admitting: Physical Therapy

## 2022-06-24 DIAGNOSIS — M25551 Pain in right hip: Secondary | ICD-10-CM

## 2022-06-24 DIAGNOSIS — M67951 Unspecified disorder of synovium and tendon, right thigh: Secondary | ICD-10-CM | POA: Diagnosis not present

## 2022-06-24 NOTE — Progress Notes (Signed)
Chief Complaint: Left hand pain and numbness     History of Present Illness:   06/24/2022: Presents today for follow-up of her right hip for MRI discussion.  She is status post reaching down to show her toe to feeling a pop in the right hip.  Betty Leonard is a 42 y.o. female left-hand-dominant female presents today with persistent ongoing numbness in the small finger with occasional shooting up and down the forearm.  She states that this came on quite suddenly 1 week ago.  She has been on gabapentin and Celebrex from Dr. Magnus Ivan which did not help her at all.  She feels like the hand is getting weak and is having a hard time opening jars.  She is experiencing numbness in the small 2 fingers.  This is worse at night.  She works in Audiological scientist    Surgical History:   None  PMH/PSH/Family History/Social History/Meds/Allergies:    Past Medical History:  Diagnosis Date   Anemia    iron infusion sept 2020   Chicken pox    Depression    Esophagitis 12/24/2011   GERD (gastroesophageal reflux disease)    History of wheezing 06/06/2018   Menorrhagia    with irregular cycles   Migraines    Pneumonia 07/2018   Recurrent acute suppurative otitis media of right ear without spontaneous rupture of tympanic membrane 08/05/2021   Seasonal allergies    Sinusitis 11/12/2021   UTI (urinary tract infection) finished antibiotic 04-11-2019   Vertigo 10/05/2021   Past Surgical History:  Procedure Laterality Date   ESOPHAGOGASTRODUODENOSCOPY  12/24/2011   Procedure: ESOPHAGOGASTRODUODENOSCOPY (EGD);  Surgeon: Louis Meckel, MD;  Location: [facility];  Service: Endoscopy;  Laterality: N/A;   FOOT SURGERY Right 2009   repair remotely after a fracture   FOOT SURGERY Right 1'2020   FOOT SURGERY Right 2023   HYSTEROSCOPY WITH NOVASURE N/A 04/25/2019   Procedure: HYSTEROSCOPY WITH NOVASURE ABLATION;  Surgeon: Jerene Bears, MD;  [facility];   Service: Gynecology;  Laterality: N/A;   KNEE ARTHROSCOPY Left 2018   Social History   Socioeconomic History   Marital status: Single    Spouse name: Not on file   Number of children: Not on file   Years of education: Not on file   Highest education level: Not on file  Occupational History   Not on file  Tobacco Use   Smoking status: Former    Packs/day: 1.00    Years: 5.00    Additional pack years: 0.00    Total pack years: 5.00    Types: Cigarettes    Quit date: 12/29/2018    Years since quitting: 3.4   Smokeless tobacco: Never   Tobacco comments:    stopped on 12/29/2018  Vaping Use   Vaping Use: Never used  Substance and Sexual Activity   Alcohol use: Not Currently    Alcohol/week: 0.0 standard drinks of alcohol   Drug use: No   Sexual activity: Not Currently    Birth control/protection: Abstinence  Other Topics Concern   Not on file  Social History Narrative   Work or Holiday representative, [employer]      Home Situation: lives with roomate      Spiritual Beliefs: Christian      Lifestyle: CV exercise ( ) 3 days per  week; diet is poor               Social Determinants of Corporate investment banker Strain: Not on file  Food Insecurity: No Food Insecurity (12/06/2019)   Hunger Vital Sign    Worried About Running Out of Food in the Last Year: Never true    Ran Out of Food in the Last Year: Never true  Transportation Needs: Not on file  Physical Activity: Not on file  Stress: Not on file  Social Connections: Not on file   Family History  Problem Relation Age of Onset   Leukemia Paternal Grandfather    Lung cancer Paternal Grandfather    Arthritis Paternal Grandfather    Diabetes Paternal Grandfather    Heart disease Paternal Grandfather    Hypertension Paternal Grandfather    Kidney disease Paternal Grandfather    Prostate cancer Paternal Grandfather    Gaucher's disease Paternal Grandfather    Sudden death Father        age 61, nothing found on  autopsy   Diabetes Father    Hyperlipidemia Father    Hypertension Father    Mental illness Mother        bipolar, schizophrenia   Alcohol abuse Maternal Grandfather    Stroke Paternal Grandmother    Colon cancer Neg Hx    Allergies  Allergen Reactions   Codeine Shortness Of Breath   Hydrocodone Shortness Of Breath   Iodine Other (See Comments)    Shellfish allergy   Shellfish Allergy Anaphylaxis   Aspirin Hives   Current Outpatient Medications  Medication Sig Dispense Refill   EPINEPHrine 0.3 mg/0.3 mL IJ SOAJ injection Inject 0.3 mg into the muscle as needed for anaphylaxis.     hydrOXYzine (ATARAX) 50 MG tablet Take 0.5-1 tablets (25-50 mg total) by mouth at bedtime as needed. For anxiety and sleep. (Patient not taking: Reported on 05/19/2022) 30 tablet 3   methylPREDNISolone (MEDROL DOSEPAK) 4 MG TBPK tablet Take per packet instructions 21 tablet 0   sertraline (ZOLOFT) 50 MG tablet Take 1 tablet (50 mg total) by mouth at bedtime. 90 tablet 3   Vitamin D, Ergocalciferol, (DRISDOL) 1.25 MG (50000 UNIT) CAPS capsule Take 1 capsule twice a week for vitamin D deficiency. 24 capsule 3   No current facility-administered medications for this visit.   No results found.  Review of Systems:   A ROS was performed including pertinent positives and negatives as documented in the HPI.  Physical Exam :   Constitutional: NAD and appears stated age Neurological: Alert and oriented Psych: Appropriate affect and cooperative There were no vitals taken for this visit.   Comprehensive Musculoskeletal Exam:    Right hip with tenderness palpation over the lateral trochanter.  There is weakness with resisted abduction.  Range of motion is 2 degrees internal/external rotation of the right hip  Imaging:    MRI right hip: There does appear to be an acute tear of the gluteus medius insertion I personally reviewed and interpreted the radiographs.   Assessment:   42 y.o. female with right  lateral trochanteric pain consistent with gluteus acute tear.  I did describe that overall would begin initially with a steroid injection in the trochanteric region as well as physical therapy for the right hip.  Will plan to proceed with this Plan :    -Return to clinic 2 months for reassessment  I personally saw and evaluated the patient, and participated in the management and treatment plan.  Huel Cote, MD Attending Physician, Orthopedic Surgery  This document was dictated using Dragon voice recognition software. A reasonable attempt at proof reading has been made to minimize errors.

## 2022-06-24 NOTE — Therapy (Signed)
  OUTPATIENT PHYSICAL THERAPY SCREEN @[address]   Patient Name: Betty Leonard MRN: [id number] DOB:05-08-1980, 42 y.o., female Today's Date: 06/24/2022  END OF SESSION:  PT End of Session - 06/24/22 1636     Visit Number 1             Past Medical History:  Diagnosis Date   Anemia    iron infusion sept 2020   Chicken pox    Depression    Esophagitis 12/24/2011   GERD (gastroesophageal reflux disease)    History of wheezing 06/06/2018   Menorrhagia    with irregular cycles   Migraines    Pneumonia 07/2018   Recurrent acute suppurative otitis media of right ear without spontaneous rupture of tympanic membrane 08/05/2021   Seasonal allergies    Sinusitis 11/12/2021   UTI (urinary tract infection) finished antibiotic 04-11-2019   Vertigo 10/05/2021   Past Surgical History:  Procedure Laterality Date   ESOPHAGOGASTRODUODENOSCOPY  12/24/2011   Procedure: ESOPHAGOGASTRODUODENOSCOPY (EGD);  Surgeon: Louis Meckel, MD;  Location: [facility];  Service: Endoscopy;  Laterality: N/A;   FOOT SURGERY Right 2009   repair remotely after a fracture   FOOT SURGERY Right 1'2020   FOOT SURGERY Right 2023   HYSTEROSCOPY WITH NOVASURE N/A 04/25/2019   Procedure: HYSTEROSCOPY WITH NOVASURE ABLATION;  Surgeon: Jerene Bears, MD;  Location: [facility];  Service: Gynecology;  Laterality: N/A;   KNEE ARTHROSCOPY Left 2018   Patient Active Problem List   Diagnosis Date Noted   Injury of left hand 02/02/2022   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 Arlyce Dice and GI  12/16/2011     THERAPY DIAG:  Pain in right hip  Goal of screen:  This patient was referred to Physical Therapy specialty screen by Huel Cote, MD for education for preparation of rehabilitation POC.   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 Gentle correction techniques applied today but right after injection was very tender. Will benefit from a PT POC for continued improvements as she hopes to avoid surgery.    Army Fossa PT, DPT 06/24/2022, 4:36 PM  [address] [place], [place] [address] [phone]   Note: charges not applied for screen.

## 2022-06-25 ENCOUNTER — Telehealth (HOSPITAL_BASED_OUTPATIENT_CLINIC_OR_DEPARTMENT_OTHER): Payer: Self-pay

## 2022-06-25 NOTE — Telephone Encounter (Signed)
It doesn't look like she has ever been scheduled at the [address] location maybe that could be an option .

## 2022-06-25 NOTE — Telephone Encounter (Signed)
Patient called the office stating that there is nothing open for PT until the middle of July.  Is there anything else that she can do. She said something about [place] ?  Or schedule surgery

## 2022-06-29 ENCOUNTER — Encounter: Payer: Self-pay | Admitting: Physical Therapy

## 2022-06-29 ENCOUNTER — Ambulatory Visit: Payer: 59 | Attending: Orthopaedic Surgery | Admitting: Physical Therapy

## 2022-06-29 ENCOUNTER — Other Ambulatory Visit: Payer: Self-pay

## 2022-06-29 DIAGNOSIS — M25551 Pain in right hip: Secondary | ICD-10-CM | POA: Diagnosis present

## 2022-06-29 DIAGNOSIS — R2689 Other abnormalities of gait and mobility: Secondary | ICD-10-CM | POA: Insufficient documentation

## 2022-06-29 DIAGNOSIS — R531 Weakness: Secondary | ICD-10-CM | POA: Insufficient documentation

## 2022-06-29 DIAGNOSIS — M67951 Unspecified disorder of synovium and tendon, right thigh: Secondary | ICD-10-CM | POA: Insufficient documentation

## 2022-06-29 NOTE — Therapy (Unsigned)
OUTPATIENT PHYSICAL THERAPY LOWER EXTREMITY EVALUATION   Patient Name: Betty Leonard MRN: [id number] DOB:[birth year], 42 y.o., female Today's Date: 06/30/2022  END OF SESSION:  PT End of Session - 06/29/22 1622     Visit Number 1    Number of Visits 8    Date for PT Re-Evaluation 08/25/22    Authorization Type [employer]    PT Start Time 1620    PT Stop Time 1700    PT Time Calculation (min) 40 min    Activity Tolerance Patient tolerated treatment well             Past Medical History:  Diagnosis Date   Anemia    iron infusion sept 2020   Chicken pox    Depression    Esophagitis 12/24/2011   GERD (gastroesophageal reflux disease)    History of wheezing 06/06/2018   Menorrhagia    with irregular cycles   Migraines    Pneumonia 07/2018   Recurrent acute suppurative otitis media of right ear without spontaneous rupture of tympanic membrane 08/05/2021   Seasonal allergies    Sinusitis 11/12/2021   UTI (urinary tract infection) finished antibiotic 04-11-2019   Vertigo 10/05/2021   Past Surgical History:  Procedure Laterality Date   ESOPHAGOGASTRODUODENOSCOPY  12/24/2011   Procedure: ESOPHAGOGASTRODUODENOSCOPY (EGD);  Surgeon: Louis Meckel, MD;  Location: [facility];  Service: Endoscopy;  Laterality: N/A;   FOOT SURGERY Right 2009   repair remotely after a fracture   FOOT SURGERY Right 1'2020   FOOT SURGERY Right 2023   HYSTEROSCOPY WITH NOVASURE N/A 04/25/2019   Procedure: HYSTEROSCOPY WITH NOVASURE ABLATION;  Surgeon: Jerene Bears, MD;  Location: [facility];  Service: Gynecology;  Laterality: N/A;   KNEE ARTHROSCOPY Left 2018   Patient Active Problem List   Diagnosis Date Noted   Injury of left hand 02/02/2022   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 Arlyce Dice and GI 12/16/2011    PCP: Tollie Eth, NP  REFERRING PROVIDER: Huel Cote, MD  REFERRING DIAG: [id number] (ICD-10-CM) - Tendinopathy of right gluteus medius  THERAPY DIAG:  Pain in right hip  Other abnormalities of gait and mobility  Weakness  Tendinopathy of right gluteus medius  Rationale for Evaluation and Treatment: Rehabilitation  ONSET DATE: ~2 weeks ago  SUBJECTIVE:   SUBJECTIVE STATEMENT: Pt states she was pulling up her pant leg showing her tattoo and felt her R hip snap. Was barely able to move by the end of the week. Pt states everything sucks. Wants conservative therapy as much as possible to avoid surgery. Pt has had steroids and injections. Has been doing exercises given to her from [facility].   PERTINENT HISTORY: 3 foot reconstructions, L shoulder thoracic outlet syndrome  PAIN:  Are you having pain? Yes: NPRS scale: 2 or 3 currently; at most 10/10 Pain location: R side of hip Pain description: Can radiate to groin occasionally Aggravating factors: going up/down stairs, getting out of bed, stepping over, laying flat, can only sit for ~1 hr Relieving factors: Pt has done stretches  PRECAUTIONS: None  WEIGHT  BEARING RESTRICTIONS: No  FALLS:  Has patient fallen in last 6 months? No  LIVING ENVIRONMENT: Lives with: lives alone Lives in: House/apartment Stairs: Yes: External: 20 steps; none Has following equipment at home: None  OCCUPATION: Accounting -- sitting desk work  PLOF: Independent  PATIENT GOALS: Improve pain with mobility  NEXT MD VISIT: Dr. Steward Drone 08/28/22  OBJECTIVE:   DIAGNOSTIC FINDINGS: R hip MRI 06/17/22 IMPRESSION: 1. No hip fracture, dislocation or avascular necrosis. 2. Right anterior labral  tear. Undersurface detachment of the right posterior superior labrum. 3. Mild stenosis of the right gluteus minimus tendon.  PATIENT SURVEYS:  LEFS: 61.3%  COGNITION: Overall cognitive status: Within functional limits for tasks assessed     SENSATION: WFL  EDEMA:  None noted  MUSCLE LENGTH: Hamstrings: Right 90 deg; Left 90 deg Thomas test: Did not assess  POSTURE: left pelvic obliquity and weight shift left  PALPATION: TTP R glute med and TFL  LOWER EXTREMITY ROM:  Active ROM Right eval Left eval  Hip flexion    Hip extension    Hip abduction    Hip adduction    Hip internal rotation    Hip external rotation    Knee flexion    Knee extension    Ankle dorsiflexion    Ankle plantarflexion    Ankle inversion    Ankle eversion     (Blank rows = not tested)  LOWER EXTREMITY MMT:  MMT Right eval Left eval  Hip flexion 5* 5  Hip extension 5 5  Hip abduction 4* 5  Hip adduction    Hip internal rotation 4 5  Hip external rotation 4+ 5  Knee flexion 5 5  Knee extension 5 5  Ankle dorsiflexion    Ankle plantarflexion    Ankle inversion    Ankle eversion     (Blank rows = not tested) * = concordant pain  LOWER EXTREMITY SPECIAL TESTS:  Hip special tests: Luisa Hart (FABER) test: positive , Hip scouring test: positive , and Anterior hip impingement test: positive   FUNCTIONAL TESTS:  Did not assess  GAIT: Distance walked: 100' Assistive device utilized: None Level of assistance: Complete Independence Comments: WFL   TODAY'S TREATMENT:                                                                                                                              DATE: 06/29/22 See HEP below    PATIENT EDUCATION:  Education details: Exam findings, POC, modify HEP Person educated: Patient Education method: Explanation, Demonstration, and Handouts Education comprehension: verbalized understanding, returned demonstration, and needs further  education  HOME EXERCISE PROGRAM: Access Code: [email or web address] URL: [email or web address] Date: 06/29/2022 Prepared by: Vernon Prey April Kirstie Peri  Exercises - Seated Long Arc Quad  - 5 x daily - 7 x weekly - 1 sets - 5 reps - Prone Heel Squeeze  - 1 x daily - 7 x weekly - 2 sets - 10 reps -  3 sec hold - Prone Hip Internal Rotation AROM  - 1 x daily - 7 x weekly - 2 sets - 10 reps - Hooklying Bilateral Isometric Clamshell  - 1 x daily - 7 x weekly - 2 sets - 10 reps - 3 sec hold - Prone Hip Extension - One Pillow  - 1 x daily - 7 x weekly - 2 sets - 10 reps - 3 sec hold - Supine Posterior Pelvic Tilt  - 1 x daily - 7 x weekly - 2 sets - 10 reps  ASSESSMENT:  CLINICAL IMPRESSION: Patient is a 42 y.o. F who was seen today for physical therapy evaluation and treatment for R hip pain. MRI indicates R hip labral tear. Assessment significant for R glute, hip IR and ER weakness/instability with increased groin pain noted. Pt would benefit from PT to trial conservative therapy before consideration of surgery. Initiated gentle isometric exercises as tolerated today.   OBJECTIVE IMPAIRMENTS: decreased activity tolerance, decreased balance, decreased endurance, decreased mobility, decreased ROM, decreased strength, increased fascial restrictions, increased muscle spasms, impaired flexibility, improper body mechanics, postural dysfunction, and pain.   ACTIVITY LIMITATIONS: carrying, lifting, bending, sitting, squatting, stairs, transfers, bed mobility, bathing, toileting, hygiene/grooming, and locomotion level  PARTICIPATION LIMITATIONS: cleaning, laundry, driving, shopping, community activity, occupation, and yard work  PERSONAL FACTORS: Fitness, Past/current experiences, and Time since onset of injury/illness/exacerbation are also affecting patient's functional outcome.   REHAB POTENTIAL: Good  CLINICAL DECISION MAKING: Stable/uncomplicated  EVALUATION COMPLEXITY: Low   GOALS: Goals  reviewed with patient? Yes  SHORT TERM GOALS: Target date: 07/27/2022  Pt will be ind with initial HEP Baseline: Goal status: INITIAL  2.  Pt will be able to perform bed transfers with </=2/10 pain Baseline:  Goal status: INITIAL    LONG TERM GOALS: Target date: 08/25/2022   Pt will be ind with management and progression of HEP Baseline:  Goal status: INITIAL  2.  Pt will be able to ascend/descend stairs holding on to at least 50# to carry her dog with </=2/10 pain Baseline:  Goal status: INITIAL  3.  Pt will be able to tolerate sitting >1 hour for her work tasks Baseline:  Goal status: INITIAL  4.  Pt will have improved LEFS to >/=71.3% to demo MCID Baseline:  Goal status: INITIAL    PLAN:  PT FREQUENCY: 1x/week  PT DURATION: 8 weeks  PLANNED INTERVENTIONS: Therapeutic exercises, Therapeutic activity, Neuromuscular re-education, Balance training, Gait training, Patient/Family education, Self Care, Joint mobilization, Stair training, Aquatic Therapy, Dry Needling, Electrical stimulation, Taping, Vasopneumatic device, Ionotophoresis 4mg /ml Dexamethasone, Manual therapy, and Re-evaluation  PLAN FOR NEXT SESSION: Assess response to HEP. Continue isometric strengthening and progress as tolerated. Manual work if indicated on glute med   Quasim Doyon April Ma L [place], PT 06/30/2022, 7:59 AM

## 2022-07-01 DIAGNOSIS — Z0289 Encounter for other administrative examinations: Secondary | ICD-10-CM

## 2022-07-02 ENCOUNTER — Encounter (INDEPENDENT_AMBULATORY_CARE_PROVIDER_SITE_OTHER): Payer: Self-pay | Admitting: Family Medicine

## 2022-07-02 ENCOUNTER — Ambulatory Visit (INDEPENDENT_AMBULATORY_CARE_PROVIDER_SITE_OTHER): Payer: 59 | Admitting: Family Medicine

## 2022-07-02 VITALS — BP 104/68 | HR 79 | Temp 98.3°F | Ht 66.0 in | Wt 231.0 lb

## 2022-07-02 DIAGNOSIS — F419 Anxiety disorder, unspecified: Secondary | ICD-10-CM | POA: Diagnosis not present

## 2022-07-02 DIAGNOSIS — G8929 Other chronic pain: Secondary | ICD-10-CM | POA: Insufficient documentation

## 2022-07-02 DIAGNOSIS — Z6837 Body mass index (BMI) 37.0-37.9, adult: Secondary | ICD-10-CM

## 2022-07-02 DIAGNOSIS — F509 Eating disorder, unspecified: Secondary | ICD-10-CM | POA: Insufficient documentation

## 2022-07-02 DIAGNOSIS — M79671 Pain in right foot: Secondary | ICD-10-CM | POA: Diagnosis not present

## 2022-07-02 DIAGNOSIS — F32A Depression, unspecified: Secondary | ICD-10-CM | POA: Diagnosis not present

## 2022-07-02 DIAGNOSIS — E669 Obesity, unspecified: Secondary | ICD-10-CM

## 2022-07-02 NOTE — Assessment & Plan Note (Signed)
Patient has a long history of restrictive eating habits.  She was a chronic diet or using very low calorie diets.  She no longer has good hunger or full cues.  She is over consuming coffee.  We discussed obtaining metabolic testing at her next visit to see if her restrictive eating habits and chronic dieting have hindered her metabolic rate.  We discussed the importance of increasing metabolic rate by improving sleep, eating on a schedule with protein intake and finding ways to get in more physical activity.  Will be working on improving her relationship with food and using food as fuel.

## 2022-07-02 NOTE — Assessment & Plan Note (Signed)
Reviewed her history of previous right foot metatarsal fractures.  She has had 2 metatarsal bones removed and this has affected her ability to ambulate well.  Her lack of regular exercise has contributed further to weight gain.  She has upcoming visits with orthopedics for right hip pain.  She is scheduled for physical therapy.  Consider alternative exercise options to walking including yoga, weight training and things that cause less impact on her right foot.

## 2022-07-02 NOTE — Assessment & Plan Note (Signed)
Reviewed program information and current bioimpedance results.  Patient is motivated to attain a healthier weight and improve her overall health.  Will obtain a fasting IC, EKG and labs next visit.  Avoid use of phentermine which has previously caused mental health problems.

## 2022-07-02 NOTE — Assessment & Plan Note (Signed)
Patient has a notable history of anxiety and depression with a poor support system at home.  She is currently dealing with chronic pain.  She is currently on sertraline 50 mg at bedtime with hydroxyzine 50 mg at bedtime.  She lacks adequate sleep at night.  She may be a good candidate to add in cognitive behavioral therapy with Dr. Dewaine Conger.  Continue current medications and work on stress reduction.

## 2022-07-02 NOTE — Progress Notes (Signed)
Office: [phone]  /  Fax: [phone]   Initial Visit  Betty Leonard was seen in clinic today to evaluate for obesity. She is interested in losing weight to improve overall health and reduce the risk of weight related complications. She presents today to review program treatment options, initial physical assessment, and evaluation.     She was referred by: PCP  When asked what else they would like to accomplish? She states: Adopt healthier eating patterns, Improve energy levels and physical activity, Improve quality of life, and Improve appearance  Weight history: she started to gain weight teen years 175- 230 lb goes up and down and no diets do not seem to work any more.  She has been on a lot of restrictive diets.  Does not get hunger or full cues.  She drinks 'a lot' of coffee each day.  She puts almond milk creamer in her coffee.    When asked how has your weight affected you? She states: Contributed to orthopedic problems or mobility issues, Having fatigue, Having poor endurance, and Problems with eating patterns  Some associated conditions: Arthritis:feet  Contributing factors: Disruption of circadian rhythm, Nutritional, Stress, and Reduced physical activity  Weight promoting medications identified: None  Current nutrition plan: None  Current level of physical activity: Limited due to chronic pain or orthopedic problems  Current or previous pharmacotherapy: Phentermine  Response to medication: Had side effects so it was discontinued   Past medical history includes:   Past Medical History:  Diagnosis Date   Anemia    iron infusion sept 2020   Chicken pox    Depression    Esophagitis 12/24/2011   GERD (gastroesophageal reflux disease)    History of wheezing 06/06/2018   Menorrhagia    with irregular cycles   Migraines    Pneumonia 07/2018   Recurrent acute suppurative otitis media of right ear without spontaneous rupture of tympanic membrane 08/05/2021    Seasonal allergies    Sinusitis 11/12/2021   UTI (urinary tract infection) finished antibiotic 04-11-2019   Vertigo 10/05/2021     Objective:   BP 104/68   Pulse 79   Temp 98.3 F (36.8 C)   Ht 5\' 6"  (1.676 m)   Wt 231 lb (104.8 kg)   SpO2 97%   BMI 37.28 kg/m  She was weighed on the bioimpedance scale: Body mass index is 37.28 kg/m.  Peak Weight:231 lb , Body Fat%:46.9, Visceral Fat Rating:12, Weight trend over the last 12 months: Increasing  General:  Alert, oriented and cooperative. Patient is in no acute distress.  Respiratory: Normal respiratory effort, no problems with respiration noted   Gait: able to ambulate independently  Mental Status: Normal mood and affect. Normal behavior. Normal judgment and thought content.   DIAGNOSTIC DATA REVIEWED:  BMET    Component Value Date/Time   NA 141 05/20/2022 1424   K 4.5 05/20/2022 1424   CL 105 05/20/2022 1424   CO2 20 05/20/2022 1424   GLUCOSE 87 05/20/2022 1424   GLUCOSE 95 08/14/2020 1159   BUN 14 05/20/2022 1424   CREATININE 0.69 05/20/2022 1424   CREATININE 0.69 08/14/2020 1159   CALCIUM 9.8 05/20/2022 1424   GFRNONAA >60 08/14/2020 1159   GFRAA >60 10/04/2019 1435   Lab Results  Component Value Date   HGBA1C 5.4 05/20/2022   HGBA1C 5.5 07/28/2012   No results found for: "INSULIN" CBC    Component Value Date/Time   WBC 9.2 05/20/2022 1424   WBC 6.9 08/14/2020  1159   WBC 7.6 04/25/2019 0755   RBC 5.21 05/20/2022 1424   RBC 4.98 08/14/2020 1159   RBC 4.94 08/14/2020 1159   HGB 14.3 05/20/2022 1424   HCT 44.0 05/20/2022 1424   PLT 390 05/20/2022 1424   MCV 85 05/20/2022 1424   MCH 27.4 05/20/2022 1424   MCH 27.5 08/14/2020 1159   MCHC 32.5 05/20/2022 1424   MCHC 33.7 08/14/2020 1159   RDW 12.9 05/20/2022 1424   Iron/TIBC/Ferritin/ %Sat    Component Value Date/Time   IRON 71 05/20/2022 1424   TIBC 260 05/20/2022 1424   FERRITIN 299 (H) 05/20/2022 1424   IRONPCTSAT 27 05/20/2022 1424   Lipid  Panel     Component Value Date/Time   CHOL 191 05/20/2022 1424   TRIG 127 05/20/2022 1424   HDL 51 05/20/2022 1424   CHOLHDL 3.7 05/20/2022 1424   CHOLHDL 4 07/28/2012 0900   VLDL 19.6 07/28/2012 0900   LDLCALC 117 (H) 05/20/2022 1424   Hepatic Function Panel     Component Value Date/Time   PROT 7.0 05/20/2022 1424   ALBUMIN 4.4 05/20/2022 1424   AST 10 05/20/2022 1424   AST 24 08/14/2020 1159   ALT 9 05/20/2022 1424   ALT 36 08/14/2020 1159   ALKPHOS 69 05/20/2022 1424   BILITOT 0.5 05/20/2022 1424   BILITOT 0.4 08/14/2020 1159      Component Value Date/Time   TSH 0.849 05/20/2022 1424     Assessment and Plan:   Anxiety and depression Assessment & Plan: Patient has a notable history of anxiety and depression with a poor support system at home.  She is currently dealing with chronic pain.  She is currently on sertraline 50 mg at bedtime with hydroxyzine 50 mg at bedtime.  She lacks adequate sleep at night.  She may be a good candidate to add in cognitive behavioral therapy with Dr. Dewaine Conger.  Continue current medications and work on stress reduction.   Obesity, Class II, BMI 35-39.9 Assessment & Plan: Reviewed program information and current bioimpedance results.  Patient is motivated to attain a healthier weight and improve her overall health.  Will obtain a fasting IC, EKG and labs next visit.  Avoid use of phentermine which has previously caused mental health problems.     Chronic foot pain, right Assessment & Plan: Reviewed her history of previous right foot metatarsal fractures.  She has had 2 metatarsal bones removed and this has affected her ability to ambulate well.  Her lack of regular exercise has contributed further to weight gain.  She has upcoming visits with orthopedics for right hip pain.  She is scheduled for physical therapy.  Consider alternative exercise options to walking including yoga, weight training and things that cause less impact on her right  foot.   Eating disorder, unspecified type Assessment & Plan: Patient has a long history of restrictive eating habits.  She was a chronic diet or using very low calorie diets.  She no longer has good hunger or full cues.  She is over consuming coffee.  We discussed obtaining metabolic testing at her next visit to see if her restrictive eating habits and chronic dieting have hindered her metabolic rate.  We discussed the importance of increasing metabolic rate by improving sleep, eating on a schedule with protein intake and finding ways to get in more physical activity.  Will be working on improving her relationship with food and using food as fuel.  Obesity Treatment / Action Plan:  Patient will work on garnering support from family and friends to begin weight loss journey. Will work on eliminating or reducing the presence of highly palatable, calorie dense foods in the home. Will complete provided nutritional and psychosocial assessment questionnaire before the next appointment. Will be scheduled for indirect calorimetry to determine resting energy expenditure in a fasting state.  This will allow [place] to create a reduced calorie, high-protein meal plan to promote loss of fat mass while preserving muscle mass. Will think about ideas on how to incorporate physical activity into their daily routine. Will work on managing stress via relaxation methods as this may result in unhealthy eating patterns. Counseled on the health benefits of losing 5%-15% of total body weight. Was counseled on nutritional approaches to weight loss and benefits of reducing processed foods and consuming plant-based foods and high quality protein as part of nutritional weight management. Was counseled on pharmacotherapy and role as an adjunct in weight management.   Obesity Education Performed Today:  She was weighed on the bioimpedance scale and results were discussed and documented in the synopsis.  We  discussed obesity as a disease and the importance of a more detailed evaluation of all the factors contributing to the disease.  We discussed the importance of long term lifestyle changes which include nutrition, exercise and behavioral modifications as well as the importance of customizing this to her specific health and social needs.  We discussed the benefits of reaching a healthier weight to alleviate the symptoms of existing conditions and reduce the risks of the biomechanical, metabolic and psychological effects of obesity.  ZYLIAH FORTES appears to be in the action stage of change and states they are ready to start intensive lifestyle modifications and behavioral modifications.  30 minutes was spent today on this visit including the above counseling, pre-visit chart review, and post-visit documentation.  Reviewed by clinician on day of visit: allergies, medications, problem list, medical history, surgical history, family history, social history, and previous encounter notes pertinent to obesity diagnosis.    Seymour Bars, D.O. DABFM, DABOM [facility] [phone] [place], [place] [address] [phone]

## 2022-07-07 ENCOUNTER — Ambulatory Visit (HOSPITAL_BASED_OUTPATIENT_CLINIC_OR_DEPARTMENT_OTHER): Payer: 59 | Attending: Orthopaedic Surgery | Admitting: Physical Therapy

## 2022-07-07 ENCOUNTER — Encounter (HOSPITAL_BASED_OUTPATIENT_CLINIC_OR_DEPARTMENT_OTHER): Payer: Self-pay | Admitting: Physical Therapy

## 2022-07-07 DIAGNOSIS — M25512 Pain in left shoulder: Secondary | ICD-10-CM | POA: Diagnosis present

## 2022-07-07 DIAGNOSIS — R531 Weakness: Secondary | ICD-10-CM | POA: Insufficient documentation

## 2022-07-07 DIAGNOSIS — R2689 Other abnormalities of gait and mobility: Secondary | ICD-10-CM | POA: Diagnosis present

## 2022-07-07 DIAGNOSIS — M67951 Unspecified disorder of synovium and tendon, right thigh: Secondary | ICD-10-CM | POA: Insufficient documentation

## 2022-07-07 DIAGNOSIS — M25551 Pain in right hip: Secondary | ICD-10-CM | POA: Diagnosis present

## 2022-07-07 NOTE — Therapy (Signed)
OUTPATIENT PHYSICAL THERAPY LOWER EXTREMITY EVALUATION   Patient Name: Betty Leonard MRN: [id number] DOB:1980/02/02, 42 y.o., female Today's Date: 06/30/2022  END OF SESSION:  PT End of Session - 06/29/22 1622     Visit Number 1    Number of Visits 8    Date for PT Re-Evaluation 08/25/22    Authorization Type [employer]    PT Start Time 1620    PT Stop Time 1700    PT Time Calculation (min) 40 min    Activity Tolerance Patient tolerated treatment well             Past Medical History:  Diagnosis Date   Anemia    iron infusion sept 2020   Chicken pox    Depression    Esophagitis 12/24/2011   GERD (gastroesophageal reflux disease)    History of wheezing 06/06/2018   Menorrhagia    with irregular cycles   Migraines    Pneumonia 07/2018   Recurrent acute suppurative otitis media of right ear without spontaneous rupture of tympanic membrane 08/05/2021   Seasonal allergies    Sinusitis 11/12/2021   UTI (urinary tract infection) finished antibiotic 04-11-2019   Vertigo 10/05/2021   Past Surgical History:  Procedure Laterality Date   ESOPHAGOGASTRODUODENOSCOPY  12/24/2011   Procedure: ESOPHAGOGASTRODUODENOSCOPY (EGD);  Surgeon: Louis Meckel, MD;  Location: [facility];  Service: Endoscopy;  Laterality: N/A;   FOOT SURGERY Right 2009   repair remotely after a fracture   FOOT SURGERY Right 1'2020   FOOT SURGERY Right 2023   HYSTEROSCOPY WITH NOVASURE N/A 04/25/2019   Procedure: HYSTEROSCOPY WITH NOVASURE ABLATION;  Surgeon: Jerene Bears, MD;  Location: [facility];  Service: Gynecology;  Laterality: N/A;   KNEE ARTHROSCOPY Left 2018   Patient Active Problem List   Diagnosis Date Noted   Injury of left hand 02/02/2022   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 Arlyce Dice and GI 12/16/2011    PCP: Tollie Eth, NP  REFERRING PROVIDER: Huel Cote, MD  REFERRING DIAG: [phone] (ICD-10-CM) - Tendinopathy of right gluteus medius  THERAPY DIAG:  Pain in right hip  Other abnormalities of gait and mobility  Weakness  Tendinopathy of right gluteus medius  Rationale for Evaluation and Treatment: Rehabilitation  ONSET DATE: ~2 weeks ago  SUBJECTIVE:   SUBJECTIVE STATEMENT: The patient reports significant pain still at this time. She has not been able to i PERTINENT HISTORY: 3 foot reconstructions, L shoulder thoracic outlet syndrome  PAIN:  Are you having pain? Yes: NPRS scale: 2 or 3 currently; at most 10/10 Pain location: R side of hip Pain description: Can radiate to groin occasionally Aggravating factors: going up/down stairs, getting out of bed, stepping over, laying flat, can only sit for ~1 hr Relieving factors: Pt has done stretches  PRECAUTIONS: None  WEIGHT BEARING RESTRICTIONS: No  FALLS:  Has patient fallen in last 6 months? No  LIVING ENVIRONMENT: Lives with: lives alone Lives in: House/apartment Stairs: Yes: External: 20 steps; none Has following equipment at home: None  OCCUPATION: Accounting -- sitting desk work  PLOF:  Independent  PATIENT GOALS: Improve pain with mobility  NEXT MD VISIT: Dr. Steward Drone 08/28/22  OBJECTIVE:   DIAGNOSTIC FINDINGS: R hip MRI 06/17/22 IMPRESSION: 1. No hip fracture, dislocation or avascular necrosis. 2. Right anterior labral tear. Undersurface detachment of the right posterior superior labrum. 3. Mild stenosis of the right gluteus minimus tendon.  PATIENT SURVEYS:  LEFS: 61.3%  COGNITION: Overall cognitive status: Within functional limits for tasks  assessed     SENSATION: WFL  EDEMA:  None noted  MUSCLE LENGTH: Hamstrings: Right 90 deg; Left 90 deg Thomas test: Did not assess  POSTURE: left pelvic obliquity and weight shift left  PALPATION: TTP R glute med and TFL  LOWER EXTREMITY ROM:  Active ROM Right eval Left eval  Hip flexion    Hip extension    Hip abduction    Hip adduction    Hip internal rotation    Hip external rotation    Knee flexion    Knee extension    Ankle dorsiflexion    Ankle plantarflexion    Ankle inversion    Ankle eversion     (Blank rows = not tested)  LOWER EXTREMITY MMT:  MMT Right eval Left eval  Hip flexion 5* 5  Hip extension 5 5  Hip abduction 4* 5  Hip adduction    Hip internal rotation 4 5  Hip external rotation 4+ 5  Knee flexion 5 5  Knee extension 5 5  Ankle dorsiflexion    Ankle plantarflexion    Ankle inversion    Ankle eversion     (Blank rows = not tested) * = concordant pain  LOWER EXTREMITY SPECIAL TESTS:  Hip special tests: Luisa Hart (FABER) test: positive , Hip scouring test: positive , and Anterior hip impingement test: positive   FUNCTIONAL TESTS:  Did not assess  GAIT: Distance walked: 100' Assistive device utilized: None Level of assistance: Complete Independence Comments: WFL   TODAY'S TREATMENT:                                                                                                                              DATE: 06/29/22 6/19 Manual: trigger point release to lateral gluteal in side lying; grade I lad with oscillations for inflammation. Improved pain noted. R Trigger point release to anterior hip. Gentre PROM into flexion ER and IR avoiding pain  Reviewed how to go up and down the steps to reduce inflammation.   PPT x20  LTR in low range x20  Standing weight shifts x20   Reviewed and updated HEP     Eval: See HEP below    PATIENT EDUCATION:  Education details: Exam findings, POC, modify HEP Person educated:  Patient Education method: Explanation, Demonstration, and Handouts Education comprehension: verbalized understanding, returned demonstration, and needs further education  HOME EXERCISE PROGRAM: Access Code: [email or web address] URL: [email or web address] Date: 06/29/2022 Prepared by: Vernon Prey April Kirstie Peri  Exercises - Seated Long Arc Quad  - 5 x  daily - 7 x weekly - 1 sets - 5 reps - Prone Heel Squeeze  - 1 x daily - 7 x weekly - 2 sets - 10 reps - 3 sec hold - Prone Hip Internal Rotation AROM  - 1 x daily - 7 x weekly - 2 sets - 10 reps - Hooklying Bilateral Isometric Clamshell  - 1 x daily - 7 x weekly - 2 sets - 10 reps - 3 sec hold - Prone Hip Extension - One Pillow  - 1 x daily - 7 x weekly - 2 sets - 10 reps - 3 sec hold - Supine Posterior Pelvic Tilt  - 1 x daily - 7 x weekly - 2 sets - 10 reps  ASSESSMENT:  CLINICAL IMPRESSION: Patient remains in significant pain. She had a significant trigger point in her anterior hip and gluteal. She has not been able to do many of her exercises because of the pain. We focused on reducing the acute spasming that she is having in multiple area. We will consider needling but she had a negative experience the last time. She reported increased knee pain after the treatment but her hip was maybe a little better. She has been having to go up and down the stairs several times a day. She was shown how to go up and down to reduce inflammation in the hip. We will see her again a few times over the next week to see if we can control her acute pain.    Eval: Patient is a 42 y.o. F who was seen today for physical therapy evaluation and treatment for R hip pain. MRI indicates R hip labral tear. Assessment significant for R glute, hip IR and ER weakness/instability with increased groin pain noted. Pt would benefit from PT to trial conservative therapy before consideration of surgery. Initiated gentle isometric exercises as tolerated today.   OBJECTIVE  IMPAIRMENTS: decreased activity tolerance, decreased balance, decreased endurance, decreased mobility, decreased ROM, decreased strength, increased fascial restrictions, increased muscle spasms, impaired flexibility, improper body mechanics, postural dysfunction, and pain.   ACTIVITY LIMITATIONS: carrying, lifting, bending, sitting, squatting, stairs, transfers, bed mobility, bathing, toileting, hygiene/grooming, and locomotion level  PARTICIPATION LIMITATIONS: cleaning, laundry, driving, shopping, community activity, occupation, and yard work  PERSONAL FACTORS: Fitness, Past/current experiences, and Time since onset of injury/illness/exacerbation are also affecting patient's functional outcome.   REHAB POTENTIAL: Good  CLINICAL DECISION MAKING: Stable/uncomplicated  EVALUATION COMPLEXITY: Low   GOALS: Goals reviewed with patient? Yes  SHORT TERM GOALS: Target date: 07/27/2022  Pt will be ind with initial HEP Baseline: Goal status: INITIAL  2.  Pt will be able to perform bed transfers with </=2/10 pain Baseline:  Goal status: INITIAL    LONG TERM GOALS: Target date: 08/25/2022   Pt will be ind with management and progression of HEP Baseline:  Goal status: INITIAL  2.  Pt will be able to ascend/descend stairs holding on to at least 50# to carry her dog with </=2/10 pain Baseline:  Goal status: INITIAL  3.  Pt will be able to tolerate sitting >1 hour for her work tasks Baseline:  Goal status: INITIAL  4.  Pt will have improved LEFS to >/=71.3% to demo MCID Baseline:  Goal status: INITIAL    PLAN:  PT FREQUENCY: 1x/week  PT DURATION: 8 weeks  PLANNED INTERVENTIONS: Therapeutic exercises, Therapeutic activity, Neuromuscular re-education, Balance training, Gait training, Patient/Family education, Self Care, Joint mobilization, Stair training, Aquatic Therapy, Dry Needling, Electrical stimulation, Taping, Vasopneumatic device,  Ionotophoresis 4mg /ml Dexamethasone, Manual  therapy, and Re-evaluation  PLAN FOR NEXT SESSION: Assess response to HEP. Continue isometric strengthening and progress as tolerated. Manual work if indicated on glute med  Lorayne Bender PT DPT 06/30/2022, 7:59 AM

## 2022-07-08 ENCOUNTER — Encounter (HOSPITAL_BASED_OUTPATIENT_CLINIC_OR_DEPARTMENT_OTHER): Payer: Self-pay | Admitting: Physical Therapy

## 2022-07-08 ENCOUNTER — Ambulatory Visit (HOSPITAL_BASED_OUTPATIENT_CLINIC_OR_DEPARTMENT_OTHER): Payer: 59 | Admitting: Physical Therapy

## 2022-07-08 DIAGNOSIS — R2689 Other abnormalities of gait and mobility: Secondary | ICD-10-CM

## 2022-07-08 DIAGNOSIS — R531 Weakness: Secondary | ICD-10-CM

## 2022-07-08 DIAGNOSIS — M67951 Unspecified disorder of synovium and tendon, right thigh: Secondary | ICD-10-CM

## 2022-07-08 DIAGNOSIS — M25551 Pain in right hip: Secondary | ICD-10-CM

## 2022-07-08 NOTE — Therapy (Signed)
OUTPATIENT PHYSICAL THERAPY LOWER EXTREMITY EVALUATION   Patient Name: Betty Leonard MRN: [id number] DOB:01-Nov-1980, 42 y.o., female Today's Date: 06/30/2022  END OF SESSION:  PT End of Session - 06/29/22 1622     Visit Number 1    Number of Visits 8    Date for PT Re-Evaluation 08/25/22    Authorization Type [employer]    PT Start Time 1620    PT Stop Time 1700    PT Time Calculation (min) 40 min    Activity Tolerance Patient tolerated treatment well             Past Medical History:  Diagnosis Date   Anemia    iron infusion sept 2020   Chicken pox    Depression    Esophagitis 12/24/2011   GERD (gastroesophageal reflux disease)    History of wheezing 06/06/2018   Menorrhagia    with irregular cycles   Migraines    Pneumonia 07/2018   Recurrent acute suppurative otitis media of right ear without spontaneous rupture of tympanic membrane 08/05/2021   Seasonal allergies    Sinusitis 11/12/2021   UTI (urinary tract infection) finished antibiotic 04-11-2019   Vertigo 10/05/2021   Past Surgical History:  Procedure Laterality Date   ESOPHAGOGASTRODUODENOSCOPY  12/24/2011   Procedure: ESOPHAGOGASTRODUODENOSCOPY (EGD);  Surgeon: Louis Meckel, MD;  Location: [facility];  Service: Endoscopy;  Laterality: N/A;   FOOT SURGERY Right 2009   repair remotely after a fracture   FOOT SURGERY Right 1'2020   FOOT SURGERY Right 2023   HYSTEROSCOPY WITH NOVASURE N/A 04/25/2019   Procedure: HYSTEROSCOPY WITH NOVASURE ABLATION;  Surgeon: Jerene Bears, MD;  Location: [facility];  Service: Gynecology;  Laterality: N/A;   KNEE ARTHROSCOPY Left 2018   Patient Active Problem List   Diagnosis Date Noted   Injury of left hand 02/02/2022   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 Arlyce Dice and GI 12/16/2011    PCP: Tollie Eth, NP  REFERRING PROVIDER: Huel Cote, MD  REFERRING DIAG: [phone] (ICD-10-CM) - Tendinopathy of right gluteus medius  THERAPY DIAG:  Pain in right hip  Other abnormalities of gait and mobility  Weakness  Tendinopathy of right gluteus medius  Rationale for Evaluation and Treatment: Rehabilitation  ONSET DATE: ~2 weeks ago  SUBJECTIVE:   SUBJECTIVE STATEMENT: The patient reports it may be a little better. She is still having significant pain.  PERTINENT HISTORY: 3 foot reconstructions, L shoulder thoracic outlet syndrome  PAIN:  Are you having pain? Yes: NPRS scale: 3/10  currently; at most 10/10 Pain location: R side of hip Pain description: Can radiate to groin occasionally Aggravating factors: going up/down stairs, getting out of bed, stepping over, laying flat, can only sit for ~1 hr Relieving factors: Pt has done stretches  PRECAUTIONS: None  WEIGHT BEARING RESTRICTIONS: No  FALLS:  Has patient fallen in last 6 months? No  LIVING ENVIRONMENT: Lives with: lives alone Lives in: House/apartment Stairs: Yes: External: 20 steps; none Has following equipment at home: None  OCCUPATION: Accounting -- sitting desk work  PLOF: Independent  PATIENT GOALS: Improve pain with mobility  NEXT MD VISIT: Dr. Steward Drone 08/28/22  OBJECTIVE:   DIAGNOSTIC FINDINGS: R hip MRI 06/17/22 IMPRESSION: 1. No hip fracture, dislocation or avascular necrosis. 2. Right anterior labral tear. Undersurface detachment of the right posterior superior labrum. 3. Mild stenosis of the right gluteus minimus tendon.  PATIENT SURVEYS:  LEFS: 61.3%  COGNITION: Overall cognitive status: Within functional limits for tasks  assessed     SENSATION: WFL  EDEMA:  None noted  MUSCLE LENGTH: Hamstrings: Right 90 deg; Left 90 deg Thomas test: Did not assess  POSTURE: left pelvic obliquity and weight shift left  PALPATION: TTP R glute med and TFL  LOWER EXTREMITY ROM:  Active ROM Right eval Left eval  Hip flexion    Hip extension    Hip abduction    Hip adduction    Hip internal rotation    Hip external rotation    Knee flexion    Knee extension    Ankle dorsiflexion    Ankle plantarflexion    Ankle inversion    Ankle eversion     (Blank rows = not tested)  LOWER EXTREMITY MMT:  MMT Right eval Left eval  Hip flexion 5* 5  Hip extension 5 5  Hip abduction 4* 5  Hip adduction    Hip internal rotation 4 5  Hip external rotation 4+ 5  Knee flexion 5 5  Knee extension 5 5  Ankle dorsiflexion    Ankle plantarflexion    Ankle inversion    Ankle eversion     (Blank rows = not tested) * = concordant pain  LOWER EXTREMITY SPECIAL TESTS:  Hip special tests: Luisa Hart (FABER) test: positive , Hip scouring test: positive , and Anterior hip impingement test: positive   FUNCTIONAL TESTS:  Did not assess  GAIT: Distance walked: 100' Assistive device utilized: None Level of assistance: Complete Independence Comments: WFL   TODAY'S TREATMENT:                                                                                                                              DATE: 06/29/22 6/19 Manual: trigger point release to lateral gluteal in side lying; grade I lad with oscillations for inflammation. Improved pain noted. R Trigger point release to anterior hip. Gentre PROM into flexion ER and IR avoiding pain  Bent knee fallout 2x10 PPT x20  Prone lying 2 min  Prone hamstring curl x20   Standing anterior hip stretch 1x 10 sec had increased pain so held    6/18  Manual: trigger point release to lateral gluteal in side lying; grade I lad with oscillations for inflammation. Improved pain  noted. R Trigger point release to anterior hip. Gentre PROM into flexion ER and IR avoiding pain  Reviewed how to go up and down the steps to reduce inflammation.   PPT x20  LTR in low range x20  Standing weight shifts x20   Reviewed and  updated HEP     Eval: See HEP below    PATIENT EDUCATION:  Education details: Exam findings, POC, modify HEP Person educated: Patient Education method: Explanation, Demonstration, and Handouts Education comprehension: verbalized understanding, returned demonstration, and needs further education  HOME EXERCISE PROGRAM: Access Code: [email or web address] URL: [email or web address] Date: 06/29/2022 Prepared by: Vernon Prey April Kirstie Peri  Exercises - Seated Long Arc Quad  - 5 x daily - 7 x weekly - 1 sets - 5 reps - Prone Heel Squeeze  - 1 x daily - 7 x weekly - 2 sets - 10 reps - 3 sec hold - Prone Hip Internal Rotation AROM  - 1 x daily - 7 x weekly - 2 sets - 10 reps - Hooklying Bilateral Isometric Clamshell  - 1 x daily - 7 x weekly - 2 sets - 10 reps - 3 sec hold - Prone Hip Extension - One Pillow  - 1 x daily - 7 x weekly - 2 sets - 10 reps - 3 sec hold - Supine Posterior Pelvic Tilt  - 1 x daily - 7 x weekly - 2 sets - 10 reps  ASSESSMENT:  CLINICAL IMPRESSION: The patient did better today. She was able to tolerate improved pressure in her trigger points. We reviewed anterior stretching from a prone position. She reports it felt pretty good. She tried from a standing position and had increased pain. She is walking with less single leg instability. Therapy will continue to progress as tolerated.  Eval: Patient is a 42 y.o. F who was seen today for physical therapy evaluation and treatment for R hip pain. MRI indicates R hip labral tear. Assessment significant for R glute, hip IR and ER weakness/instability with increased groin pain noted. Pt would benefit from PT to trial conservative therapy before consideration of surgery. Initiated  gentle isometric exercises as tolerated today.   OBJECTIVE IMPAIRMENTS: decreased activity tolerance, decreased balance, decreased endurance, decreased mobility, decreased ROM, decreased strength, increased fascial restrictions, increased muscle spasms, impaired flexibility, improper body mechanics, postural dysfunction, and pain.   ACTIVITY LIMITATIONS: carrying, lifting, bending, sitting, squatting, stairs, transfers, bed mobility, bathing, toileting, hygiene/grooming, and locomotion level  PARTICIPATION LIMITATIONS: cleaning, laundry, driving, shopping, community activity, occupation, and yard work  PERSONAL FACTORS: Fitness, Past/current experiences, and Time since onset of injury/illness/exacerbation are also affecting patient's functional outcome.   REHAB POTENTIAL: Good  CLINICAL DECISION MAKING: Stable/uncomplicated  EVALUATION COMPLEXITY: Low   GOALS: Goals reviewed with patient? Yes  SHORT TERM GOALS: Target date: 07/27/2022  Pt will be ind with initial HEP Baseline: Goal status: INITIAL  2.  Pt will be able to perform bed transfers with </=2/10 pain Baseline:  Goal status: INITIAL    LONG TERM GOALS: Target date: 08/25/2022   Pt will be ind with management and progression of HEP Baseline:  Goal status: INITIAL  2.  Pt will be able to ascend/descend stairs holding on to at least 50# to carry her dog with </=2/10 pain Baseline:  Goal status: INITIAL  3.  Pt will be able to tolerate sitting >1 hour for her work tasks Baseline:  Goal status: INITIAL  4.  Pt will have improved LEFS to >/=71.3% to demo MCID Baseline:  Goal status: INITIAL    PLAN:  PT FREQUENCY: 1x/week  PT DURATION: 8 weeks  PLANNED INTERVENTIONS: Therapeutic exercises, Therapeutic activity, Neuromuscular re-education, Balance training, Gait training, Patient/Family education, Self Care, Joint mobilization, Stair training, Aquatic Therapy, Dry Needling, Electrical stimulation, Taping,  Vasopneumatic device, Ionotophoresis 4mg /ml  Dexamethasone, Manual therapy, and Re-evaluation  PLAN FOR NEXT SESSION: Assess response to HEP. Continue isometric strengthening and progress as tolerated. Manual work if indicated on glute med  Lorayne Bender PT DPT 06/30/2022, 7:59 AM

## 2022-07-13 ENCOUNTER — Other Ambulatory Visit (HOSPITAL_BASED_OUTPATIENT_CLINIC_OR_DEPARTMENT_OTHER): Payer: Self-pay

## 2022-07-13 ENCOUNTER — Ambulatory Visit (HOSPITAL_BASED_OUTPATIENT_CLINIC_OR_DEPARTMENT_OTHER): Payer: 59 | Admitting: Physical Therapy

## 2022-07-13 DIAGNOSIS — M25512 Pain in left shoulder: Secondary | ICD-10-CM

## 2022-07-13 DIAGNOSIS — R531 Weakness: Secondary | ICD-10-CM

## 2022-07-13 DIAGNOSIS — R2689 Other abnormalities of gait and mobility: Secondary | ICD-10-CM

## 2022-07-13 DIAGNOSIS — M25551 Pain in right hip: Secondary | ICD-10-CM

## 2022-07-13 DIAGNOSIS — M67951 Unspecified disorder of synovium and tendon, right thigh: Secondary | ICD-10-CM

## 2022-07-13 MED ORDER — FLUCONAZOLE 150 MG PO TABS
150.0000 mg | ORAL_TABLET | Freq: Once | ORAL | 0 refills | Status: AC
  Filled 2022-07-13: qty 1, 1d supply, fill #0

## 2022-07-13 MED ORDER — DOXYCYCLINE HYCLATE 100 MG PO TABS
ORAL_TABLET | ORAL | 0 refills | Status: DC
  Filled 2022-07-13: qty 20, 10d supply, fill #0

## 2022-07-13 NOTE — Therapy (Signed)
OUTPATIENT PHYSICAL THERAPY LOWER EXTREMITY EVALUATION   Patient Name: Betty Leonard MRN: [id number] DOB:08/03/1980, 42 y.o., female Today's Date: 06/30/2022  END OF SESSION:  PT End of Session - 06/29/22 1622     Visit Number 1    Number of Visits 8    Date for PT Re-Evaluation 08/25/22    Authorization Type [employer]    PT Start Time 1620    PT Stop Time 1700    PT Time Calculation (min) 40 min    Activity Tolerance Patient tolerated treatment well             Past Medical History:  Diagnosis Date   Anemia    iron infusion sept 2020   Chicken pox    Depression    Esophagitis 12/24/2011   GERD (gastroesophageal reflux disease)    History of wheezing 06/06/2018   Menorrhagia    with irregular cycles   Migraines    Pneumonia 07/2018   Recurrent acute suppurative otitis media of right ear without spontaneous rupture of tympanic membrane 08/05/2021   Seasonal allergies    Sinusitis 11/12/2021   UTI (urinary tract infection) finished antibiotic 04-11-2019   Vertigo 10/05/2021   Past Surgical History:  Procedure Laterality Date   ESOPHAGOGASTRODUODENOSCOPY  12/24/2011   Procedure: ESOPHAGOGASTRODUODENOSCOPY (EGD);  Surgeon: Robert D Kaplan, MD;  Location: [facility];  Service: Endoscopy;  Laterality: N/A;   FOOT SURGERY Right 2009   repair remotely after a fracture   FOOT SURGERY Right 1'2020   FOOT SURGERY Right 2023   HYSTEROSCOPY WITH NOVASURE N/A 04/25/2019   Procedure: HYSTEROSCOPY WITH NOVASURE ABLATION;  Surgeon: Miller, Mary S, MD;  [facility];  Service: Gynecology;  Laterality: N/A;   KNEE ARTHROSCOPY Left 2018   Patient Active Problem List   Diagnosis Date Noted   Injury of left hand 02/02/2022   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 Kaplan and GI 12/16/2011    PCP: Early, Sara E, NP  REFERRING PROVIDER: Bokshan, Steven, MD  REFERRING DIAG: M67.951 (ICD-10-CM) - Tendinopathy of right gluteus medius  THERAPY DIAG:  Pain in right hip  Other abnormalities of gait and mobility  Weakness  Tendinopathy of right gluteus medius  Rationale for Evaluation and Treatment: Rehabilitation  ONSET DATE: ~2 weeks ago  SUBJECTIVE:   SUBJECTIVE STATEMENT: The patient reports it may be a little better. She is still having significant pain.  PERTINENT HISTORY: 3 foot reconstructions, L shoulder thoracic outlet syndrome  PAIN:  Are you having pain? Yes: NPRS scale: 3/10  currently; at most 10/10 Pain location: R side of hip Pain description: Can radiate to groin occasionally Aggravating factors: going up/down stairs, getting out of bed, stepping over, laying flat, can only sit for ~1 hr Relieving factors: Pt has done stretches  PRECAUTIONS: None  WEIGHT BEARING RESTRICTIONS: No  FALLS:  Has patient fallen in last 6 months? No  LIVING ENVIRONMENT: Lives with: lives alone Lives in: House/apartment Stairs: Yes: External: 20 steps; none Has following equipment at home: None  OCCUPATION: Accounting -- sitting desk work  PLOF: Independent    PATIENT GOALS: Improve pain with mobility  NEXT MD VISIT: Dr. Bokshan 08/28/22  OBJECTIVE:   DIAGNOSTIC FINDINGS: R hip MRI 06/17/22 IMPRESSION: 1. No hip fracture, dislocation or avascular necrosis. 2. Right anterior labral tear. Undersurface detachment of the right posterior superior labrum. 3. Mild stenosis of the right gluteus minimus tendon.  PATIENT SURVEYS:  LEFS: 61.3%  COGNITION: Overall cognitive status: Within functional limits for tasks  assessed     SENSATION: WFL  EDEMA:  None noted  MUSCLE LENGTH: Hamstrings: Right 90 deg; Left 90 deg Thomas test: Did not assess  POSTURE: left pelvic obliquity and weight shift left  PALPATION: TTP R glute med and TFL  LOWER EXTREMITY ROM:  Active ROM Right eval Left eval  Hip flexion    Hip extension    Hip abduction    Hip adduction    Hip internal rotation    Hip external rotation    Knee flexion    Knee extension    Ankle dorsiflexion    Ankle plantarflexion    Ankle inversion    Ankle eversion     (Blank rows = not tested)  LOWER EXTREMITY MMT:  MMT Right eval Left eval  Hip flexion 5* 5  Hip extension 5 5  Hip abduction 4* 5  Hip adduction    Hip internal rotation 4 5  Hip external rotation 4+ 5  Knee flexion 5 5  Knee extension 5 5  Ankle dorsiflexion    Ankle plantarflexion    Ankle inversion    Ankle eversion     (Blank rows = not tested) * = concordant pain  LOWER EXTREMITY SPECIAL TESTS:  Hip special tests: Patrick (FABER) test: positive , Hip scouring test: positive , and Anterior hip impingement test: positive   FUNCTIONAL TESTS:  Did not assess  GAIT: Distance walked: 100' Assistive device utilized: None Level of assistance: Complete Independence Comments: WFL   TODAY'S TREATMENT:                                                                                                                              DATE: 06/29/22 6/19 Manual: trigger point release to lateral gluteal in side lying; grade I lad with oscillations for inflammation. Improved pain noted. R Trigger point release to anterior hip. Gentre PROM into flexion ER and IR avoiding pain  Bent knee fallout 2x10 PPT x20  Prone lying 2 min  Prone hamstring curl x20   Standing anterior hip stretch 1x 10 sec had increased pain so held    6/18  Manual: trigger point release to lateral gluteal in side lying; grade I lad with oscillations for inflammation. Improved pain  noted. R Trigger point release to anterior hip. Gentre PROM into flexion ER and IR avoiding pain  Reviewed how to go up and down the steps to reduce inflammation.   PPT x20  LTR in low range x20  Standing weight shifts x20   Reviewed and   updated HEP     Eval: See HEP below    PATIENT EDUCATION:  Education details: Exam findings, POC, modify HEP Person educated: Patient Education method: Explanation, Demonstration, and Handouts Education comprehension: verbalized understanding, returned demonstration, and needs further education  HOME EXERCISE PROGRAM: Access Code: [email or web address] URL: [email or web address] Date: 06/29/2022 Prepared by: Gellen April Marie Nonato  Exercises - Seated Long Arc Quad  - 5 x daily - 7 x weekly - 1 sets - 5 reps - Prone Heel Squeeze  - 1 x daily - 7 x weekly - 2 sets - 10 reps - 3 sec hold - Prone Hip Internal Rotation AROM  - 1 x daily - 7 x weekly - 2 sets - 10 reps - Hooklying Bilateral Isometric Clamshell  - 1 x daily - 7 x weekly - 2 sets - 10 reps - 3 sec hold - Prone Hip Extension - One Pillow  - 1 x daily - 7 x weekly - 2 sets - 10 reps - 3 sec hold - Supine Posterior Pelvic Tilt  - 1 x daily - 7 x weekly - 2 sets - 10 reps  ASSESSMENT:  CLINICAL IMPRESSION: The patient did better today. She was able to tolerate improved pressure in her trigger points. We reviewed anterior stretching from a prone position. She reports it felt pretty good. She tried from a standing position and had increased pain. She is walking with less single leg instability. Therapy will continue to progress as tolerated.  Eval: Patient is a 42 y.o. F who was seen today for physical therapy evaluation and treatment for R hip pain. MRI indicates R hip labral tear. Assessment significant for R glute, hip IR and ER weakness/instability with increased groin pain noted. Pt would benefit from PT to trial conservative therapy before consideration of surgery. Initiated  gentle isometric exercises as tolerated today.   OBJECTIVE IMPAIRMENTS: decreased activity tolerance, decreased balance, decreased endurance, decreased mobility, decreased ROM, decreased strength, increased fascial restrictions, increased muscle spasms, impaired flexibility, improper body mechanics, postural dysfunction, and pain.   ACTIVITY LIMITATIONS: carrying, lifting, bending, sitting, squatting, stairs, transfers, bed mobility, bathing, toileting, hygiene/grooming, and locomotion level  PARTICIPATION LIMITATIONS: cleaning, laundry, driving, shopping, community activity, occupation, and yard work  PERSONAL FACTORS: Fitness, Past/current experiences, and Time since onset of injury/illness/exacerbation are also affecting patient's functional outcome.   REHAB POTENTIAL: Good  CLINICAL DECISION MAKING: Stable/uncomplicated  EVALUATION COMPLEXITY: Low   GOALS: Goals reviewed with patient? Yes  SHORT TERM GOALS: Target date: 07/27/2022  Pt will be ind with initial HEP Baseline: Goal status: INITIAL  2.  Pt will be able to perform bed transfers with </=2/10 pain Baseline:  Goal status: INITIAL    LONG TERM GOALS: Target date: 08/25/2022   Pt will be ind with management and progression of HEP Baseline:  Goal status: INITIAL  2.  Pt will be able to ascend/descend stairs holding on to at least 50# to carry her dog with </=2/10 pain Baseline:  Goal status: INITIAL  3.  Pt will be able to tolerate sitting >1 hour for her work tasks Baseline:  Goal status: INITIAL  4.  Pt will have improved LEFS to >/=71.3% to demo MCID Baseline:  Goal status: INITIAL    PLAN:  PT FREQUENCY: 1x/week  PT DURATION: 8 weeks  PLANNED INTERVENTIONS: Therapeutic exercises, Therapeutic activity, Neuromuscular re-education, Balance training, Gait training, Patient/Family education, Self Care, Joint mobilization, Stair training, Aquatic Therapy, Dry Needling, Electrical stimulation, Taping,  Vasopneumatic device, Ionotophoresis 4mg/ml   Dexamethasone, Manual therapy, and Re-evaluation  PLAN FOR NEXT SESSION: Assess response to HEP. Continue isometric strengthening and progress as tolerated. Manual work if indicated on glute med  Shuntel Fishburn PT DPT 06/30/2022, 7:59 AM  

## 2022-07-14 ENCOUNTER — Encounter (HOSPITAL_BASED_OUTPATIENT_CLINIC_OR_DEPARTMENT_OTHER): Payer: Self-pay | Admitting: Physical Therapy

## 2022-07-15 ENCOUNTER — Other Ambulatory Visit (HOSPITAL_BASED_OUTPATIENT_CLINIC_OR_DEPARTMENT_OTHER): Payer: Self-pay

## 2022-07-15 MED ORDER — ONDANSETRON 4 MG PO TBDP
ORAL_TABLET | ORAL | 0 refills | Status: DC
  Filled 2022-07-15: qty 9, 3d supply, fill #0

## 2022-07-25 ENCOUNTER — Other Ambulatory Visit (HOSPITAL_BASED_OUTPATIENT_CLINIC_OR_DEPARTMENT_OTHER): Payer: Self-pay

## 2022-07-27 ENCOUNTER — Telehealth: Payer: Self-pay | Admitting: Orthopaedic Surgery

## 2022-07-27 NOTE — Telephone Encounter (Signed)
LMOM for patient to return call.

## 2022-07-27 NOTE — Telephone Encounter (Signed)
Pt has a few a question about PT please advise would like a call

## 2022-07-29 ENCOUNTER — Ambulatory Visit (INDEPENDENT_AMBULATORY_CARE_PROVIDER_SITE_OTHER): Payer: 59 | Admitting: Family Medicine

## 2022-07-29 ENCOUNTER — Encounter (INDEPENDENT_AMBULATORY_CARE_PROVIDER_SITE_OTHER): Payer: Self-pay | Admitting: Family Medicine

## 2022-07-29 VITALS — BP 115/83 | HR 83 | Temp 97.7°F | Ht 66.0 in | Wt 232.0 lb

## 2022-07-29 DIAGNOSIS — F419 Anxiety disorder, unspecified: Secondary | ICD-10-CM

## 2022-07-29 DIAGNOSIS — E7849 Other hyperlipidemia: Secondary | ICD-10-CM | POA: Diagnosis not present

## 2022-07-29 DIAGNOSIS — F32A Depression, unspecified: Secondary | ICD-10-CM

## 2022-07-29 DIAGNOSIS — R0602 Shortness of breath: Secondary | ICD-10-CM

## 2022-07-29 DIAGNOSIS — R5383 Other fatigue: Secondary | ICD-10-CM | POA: Diagnosis not present

## 2022-07-29 DIAGNOSIS — E669 Obesity, unspecified: Secondary | ICD-10-CM | POA: Insufficient documentation

## 2022-07-29 DIAGNOSIS — E559 Vitamin D deficiency, unspecified: Secondary | ICD-10-CM | POA: Diagnosis not present

## 2022-07-29 DIAGNOSIS — Z1331 Encounter for screening for depression: Secondary | ICD-10-CM | POA: Insufficient documentation

## 2022-07-29 DIAGNOSIS — Z6837 Body mass index (BMI) 37.0-37.9, adult: Secondary | ICD-10-CM | POA: Insufficient documentation

## 2022-07-30 ENCOUNTER — Encounter (INDEPENDENT_AMBULATORY_CARE_PROVIDER_SITE_OTHER): Payer: Self-pay

## 2022-07-30 LAB — INSULIN, RANDOM: INSULIN: 14.4 u[IU]/mL (ref 2.6–24.9)

## 2022-07-30 LAB — FOLATE: Folate: 4.2 ng/mL (ref >3.0)

## 2022-07-30 LAB — CHLORIDE: Chloride: 104 mmol/L (ref 96–106)

## 2022-07-30 LAB — VITAMIN D 25 HYDROXY (VIT D DEFICIENCY, FRACTURES): Vit D, 25-Hydroxy: 16.2 ng/mL — ABNORMAL LOW (ref 30.0–100.0)

## 2022-07-31 ENCOUNTER — Ambulatory Visit (HOSPITAL_BASED_OUTPATIENT_CLINIC_OR_DEPARTMENT_OTHER): Payer: 59 | Attending: Orthopaedic Surgery | Admitting: Physical Therapy

## 2022-07-31 ENCOUNTER — Encounter (HOSPITAL_BASED_OUTPATIENT_CLINIC_OR_DEPARTMENT_OTHER): Payer: Self-pay | Admitting: Physical Therapy

## 2022-07-31 DIAGNOSIS — M67951 Unspecified disorder of synovium and tendon, right thigh: Secondary | ICD-10-CM | POA: Diagnosis present

## 2022-07-31 DIAGNOSIS — M25551 Pain in right hip: Secondary | ICD-10-CM | POA: Diagnosis present

## 2022-07-31 DIAGNOSIS — R2689 Other abnormalities of gait and mobility: Secondary | ICD-10-CM | POA: Insufficient documentation

## 2022-07-31 DIAGNOSIS — R531 Weakness: Secondary | ICD-10-CM | POA: Insufficient documentation

## 2022-07-31 NOTE — Therapy (Signed)
OUTPATIENT PHYSICAL THERAPY LOWER EXTREMITY EVALUATION   Patient Name: Betty Leonard MRN: [id number] DOB:[birth year], 42 y.o., female Today's Date: 07/31/2022  END OF SESSION:  PT End of Session - 07/31/22 1555     Visit Number 5    Number of Visits 8    Date for PT Re-Evaluation 08/25/22    Authorization Type [employer]    PT Start Time 1550    PT Stop Time 1628    PT Time Calculation (min) 38 min    Activity Tolerance Patient limited by pain    Behavior During Therapy [facility] for tasks assessed/performed             Past Medical History:  Diagnosis Date   Anemia    iron infusion sept 2020   Anxiety    Chicken pox    Depression    Esophagitis 12/24/2011   GERD (gastroesophageal reflux disease)    History of wheezing 06/06/2018   Joint pain    Menorrhagia    with irregular cycles   Migraines    Multiple food allergies    Pneumonia 07/2018   Recurrent acute suppurative otitis media of right ear without spontaneous rupture of tympanic membrane 08/05/2021   Seasonal allergies    Sinusitis 11/12/2021   UTI (urinary tract infection) finished antibiotic 04-11-2019   Vertigo 10/05/2021   Vitamin D deficiency    Past Surgical History:  Procedure Laterality Date   ESOPHAGOGASTRODUODENOSCOPY  12/24/2011   Procedure: ESOPHAGOGASTRODUODENOSCOPY (EGD);  Surgeon: Louis Meckel, MD;  Location: [facility];  Service: Endoscopy;  Laterality: N/A;   FOOT SURGERY Right 2009   repair remotely after a fracture   FOOT SURGERY Right 1'2020   FOOT SURGERY Right 2023   HYSTEROSCOPY WITH NOVASURE N/A 04/25/2019   Procedure: HYSTEROSCOPY WITH NOVASURE ABLATION;  Surgeon: Jerene Bears, MD;  Location: [facility];  Service: Gynecology;  Laterality: N/A;   KNEE ARTHROSCOPY Left 2018   Patient Active Problem List   Diagnosis Date Noted   Vitamin D deficiency 07/29/2022   SOBOE (shortness of breath on exertion) 07/29/2022   Other fatigue 07/29/2022   Other hyperlipidemia  07/29/2022   BMI 37.0-37.9, adult 07/29/2022   Generalized obesity with starting BMI 37.5 07/29/2022   Depression screen 07/29/2022   Chronic foot pain, right 07/02/2022   Eating disorder 07/02/2022   Injury of left hand 02/02/2022   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 Arlyce Dice and GI 12/16/2011    PCP: Tollie Eth, NP  REFERRING PROVIDER: Huel Cote, MD  REFERRING DIAG: [phone] (ICD-10-CM) - Tendinopathy of right gluteus medius  THERAPY DIAG:  Pain in right hip  Other abnormalities of gait and mobility  Weakness  Tendinopathy of right gluteus medius  Rationale for Evaluation and Treatment: Rehabilitation  ONSET DATE: ~2 weeks ago  SUBJECTIVE:   SUBJECTIVE STATEMENT: The patient is doing better. She is having less pain. Over the weekend she had pain in the opposite side of her back. She was able to do some walking over the weekend.    PERTINENT HISTORY: 3  foot reconstructions, L shoulder thoracic outlet syndrome  PAIN:  Are you having pain? Yes: NPRS scale: 3/10  currently; at most 10/10 Pain location: R side of hip Pain description: Can radiate to groin occasionally Aggravating factors: going up/down stairs, getting out of bed, stepping over, laying flat, can only sit for ~1 hr Relieving factors: Pt has done stretches  PRECAUTIONS: None  WEIGHT BEARING RESTRICTIONS: No  FALLS:  Has patient fallen in last 6 months? No  LIVING ENVIRONMENT: Lives with: lives alone Lives in: House/apartment Stairs: Yes: External: 20 steps;  none Has following equipment at home: None  OCCUPATION: Accounting -- sitting desk work  PLOF: Independent  PATIENT GOALS: Improve pain with mobility  NEXT MD VISIT: Dr. Steward Drone 08/28/22  OBJECTIVE:   DIAGNOSTIC FINDINGS: R hip MRI 06/17/22 IMPRESSION: 1. No hip fracture, dislocation or avascular necrosis. 2. Right anterior labral tear. Undersurface detachment of the right posterior superior labrum. 3. Mild stenosis of the right gluteus minimus tendon.  PATIENT SURVEYS:  LEFS: 61.3%  COGNITION: Overall cognitive status: Within functional limits for tasks assessed     SENSATION: WFL  EDEMA:  None noted  MUSCLE LENGTH: Hamstrings: Right 90 deg; Left 90 deg Thomas test: Did not assess  POSTURE: left pelvic obliquity and weight shift left  PALPATION: TTP R glute med and TFL  LOWER EXTREMITY ROM:  Active ROM Right eval Left eval  Hip flexion    Hip extension    Hip abduction    Hip adduction    Hip internal rotation    Hip external rotation    Knee flexion    Knee extension    Ankle dorsiflexion    Ankle plantarflexion    Ankle inversion    Ankle eversion     (Blank rows = not tested)  LOWER EXTREMITY MMT:  MMT Right eval Left eval  Hip flexion 5* 5  Hip extension 5 5  Hip abduction 4* 5  Hip adduction    Hip internal rotation 4 5  Hip external rotation 4+ 5  Knee flexion 5 5  Knee extension 5 5  Ankle dorsiflexion    Ankle plantarflexion    Ankle inversion    Ankle eversion     (Blank rows = not tested) * = concordant pain  LOWER EXTREMITY SPECIAL TESTS:  Hip special tests: Luisa Hart (FABER) test: positive , Hip scouring test: positive , and Anterior hip impingement test: positive   FUNCTIONAL TESTS:  Did not assess  GAIT: Distance walked: 100' Assistive device utilized: None Level of assistance: Complete Independence Comments: WFL   TODAY'S TREATMENT:                                                                                                                               DATE: 06/29/22 6/24 Manual: trigger point release to lateral gluteal in side lying; grade I lad with oscillations for inflammation. Improved pain noted. R Trigger point release to anterior hip.  Gentre PROM into flexion ER and IR avoiding pain  PPT x20 Reviewed pronelying for home   Quad set 2x20  SAQ 2x20  Nu-step 5 min L2   LAQ 2x10  Seated hip abduction 2x10 yellow   Ball stretch 10x 10 sec hold  Lateral 5x each 10 sec hold   Standing weight shift x20   Reviewed HEP and how to use at home    6/19 Manual: trigger point release to lateral gluteal in side lying; grade I lad with oscillations for inflammation. Improved pain noted. R Trigger point release to anterior hip. Gentre PROM into flexion ER and IR avoiding pain  Bent knee fallout 2x10 PPT x20  Prone lying 2 min  Prone hamstring curl x20   Standing anterior hip stretch 1x 10 sec had increased pain so held    6/18  Manual: trigger point release to lateral gluteal in side lying; grade I lad with oscillations for inflammation. Improved pain noted. R Trigger point release to anterior hip. Gentre PROM into flexion ER and IR avoiding pain  Reviewed how to go up and down the steps to reduce inflammation.   PPT x20  LTR in low range x20  Standing weight shifts x20   Reviewed and updated HEP     Eval: See HEP below    PATIENT EDUCATION:  Education details: Exam findings, POC, modify HEP Person educated: Patient Education method: Explanation, Demonstration, and Handouts Education comprehension: verbalized understanding, returned demonstration, and needs further education  HOME EXERCISE PROGRAM: Access Code: [email or web address] URL: [email or web address] Date: 06/29/2022 Prepared by: Vernon Prey April Kirstie Peri  Exercises - Seated Long Arc Quad  - 5 x daily - 7 x weekly - 1 sets - 5 reps - Prone Heel Squeeze  - 1 x daily - 7 x weekly - 2 sets - 10 reps - 3 sec  hold - Prone Hip Internal Rotation AROM  - 1 x daily - 7 x weekly - 2 sets - 10 reps - Hooklying Bilateral Isometric Clamshell  - 1 x daily - 7 x weekly - 2 sets - 10 reps - 3 sec hold - Prone Hip Extension - One Pillow  - 1 x daily - 7 x weekly - 2 sets - 10 reps - 3 sec hold - Supine Posterior Pelvic Tilt  - 1 x daily - 7 x weekly - 2 sets - 10 reps  ASSESSMENT:  CLINICAL IMPRESSION: The patient has made good progress. She is having much less pain in her hip. We reviewed and updated her HEP. Her ROM is full. She is able to do the steps with a reciprocal gait. She did report some pain with a LAQ. She was advised to un weight it. She is also beginning  to have some pain in her shoulder. Therapy printed her exercises off from previous visit. She was advised to stretch. She will keep her remaining appointments. If she is doing well she will cancel them.   Eval: Patient is a 42 y.o. F who was seen today for physical therapy evaluation and treatment for R hip pain. MRI indicates R hip labral tear. Assessment significant for R glute, hip IR and ER weakness/instability with increased groin pain noted. Pt would benefit from PT to trial conservative therapy before consideration of surgery. Initiated gentle isometric exercises as tolerated today.   OBJECTIVE IMPAIRMENTS: decreased activity tolerance, decreased balance, decreased endurance, decreased mobility, decreased ROM, decreased strength, increased fascial restrictions, increased muscle spasms, impaired flexibility, improper body mechanics, postural dysfunction,  and pain.   ACTIVITY LIMITATIONS: carrying, lifting, bending, sitting, squatting, stairs, transfers, bed mobility, bathing, toileting, hygiene/grooming, and locomotion level  PARTICIPATION LIMITATIONS: cleaning, laundry, driving, shopping, community activity, occupation, and yard work  PERSONAL FACTORS: Fitness, Past/current experiences, and Time since onset of injury/illness/exacerbation are  also affecting patient's functional outcome.   REHAB POTENTIAL: Good  CLINICAL DECISION MAKING: Stable/uncomplicated  EVALUATION COMPLEXITY: Low   GOALS: Goals reviewed with patient? Yes  SHORT TERM GOALS: Target date: 07/27/2022  Pt will be ind with initial HEP Baseline: Goal status: INITIAL  2.  Pt will be able to perform bed transfers with </=2/10 pain Baseline:  Goal status: INITIAL    LONG TERM GOALS: Target date: 08/25/2022   Pt will be ind with management and progression of HEP Baseline:  Goal status: INITIAL  2.  Pt will be able to ascend/descend stairs holding on to at least 50# to carry her dog with </=2/10 pain Baseline:  Goal status: INITIAL  3.  Pt will be able to tolerate sitting >1 hour for her work tasks Baseline:  Goal status: INITIAL  4.  Pt will have improved LEFS to >/=71.3% to demo MCID Baseline:  Goal status: INITIAL    PLAN:  PT FREQUENCY: 1x/week  PT DURATION: 8 weeks  PLANNED INTERVENTIONS: Therapeutic exercises, Therapeutic activity, Neuromuscular re-education, Balance training, Gait training, Patient/Family education, Self Care, Joint mobilization, Stair training, Aquatic Therapy, Dry Needling, Electrical stimulation, Taping, Vasopneumatic device, Ionotophoresis 4mg /ml Dexamethasone, Manual therapy, and Re-evaluation  PLAN FOR NEXT SESSION: Assess response to HEP. Continue isometric strengthening and progress as tolerated. Manual work if indicated on glute med  Lorayne Bender PT DPT 07/31/2022, 3:57 PM

## 2022-08-03 ENCOUNTER — Encounter (HOSPITAL_BASED_OUTPATIENT_CLINIC_OR_DEPARTMENT_OTHER): Payer: Self-pay | Admitting: Physical Therapy

## 2022-08-03 NOTE — Progress Notes (Unsigned)
Chief Complaint:   OBESITY Betty Leonard (MR# [id number]) is a 42 y.o. female who presents for evaluation and treatment of obesity and related comorbidities. Current BMI is Body mass index is 37.45 kg/m. Betty Leonard has been struggling with her weight for many years and has been unsuccessful in either losing weight, maintaining weight loss, or reaching her healthy weight goal.  Betty Leonard is currently in the action stage of change and ready to dedicate time achieving and maintaining a healthier weight. Betty Leonard is interested in becoming our patient and working on intensive lifestyle modifications including (but not limited to) diet and exercise for weight loss.  Patient is single with no children, and lives with a roommate.  Works a sedentary job in Audiological scientist, stress and boredom eater.  No regular exercise.  Drinks a lot of coffee.  Would like to lose 70 pounds.  In PT and checking out pool at [facility].  Betty Leonard's habits were reviewed today and are as follows: her desired weight loss is 72 lbs, she has been heavy most of her life, she started gaining weight as she has been always up and down, her heaviest weight ever was 230 pounds, she is a picky eater and doesn't like to eat healthier foods, she has significant food cravings issues, she snacks frequently in the evenings, she skips meals frequently, she is frequently drinking liquids with calories, she frequently makes poor food choices, and she struggles with emotional eating.  Depression Screen Betty Leonard's Food and Mood (modified PHQ-9) score was 9.  Subjective:   1. Other fatigue Betty Leonard admits to daytime somnolence and admits to waking up still tired. Patient has a history of symptoms of daytime fatigue, morning fatigue, and morning headache. Betty Leonard generally gets 4 or 5 hours of sleep per night, and states that she has nightime awakenings. Snoring is not present. Apneic episodes are not present. Epworth Sleepiness Score is 6.  EKG-normal sinus rhythm  from [facility] on 07/15/2022.  Labs were reviewed in chart, update labs today.  2. SOBOE (shortness of breath on exertion) Betty Leonard notes increasing shortness of breath with exercising and seems to be worsening over time with weight gain. She notes getting out of breath sooner with activity than she used to. This has not gotten worse recently. Betty Leonard denies shortness of breath at rest or orthopnea.  3. Vitamin D deficiency Patient is on prescription vitamin D 50,000 IU twice weekly.  Last vitamin D level was 13.7 on 05/20/2022.  4. Anxiety and depression Patient is on sertraline 50 mg nightly.  She stopped taking it 2 months ago.  Her bariatric PHQ-9 score is 9.  5. Other hyperlipidemia Patient's last LDL was 117 on 05/20/2022, reviewed in chart.  She has never been on lipid-lowering medications.  Assessment/Plan:   1. Other fatigue Thy does feel that her weight is causing her energy to be lower than it should be. Fatigue may be related to obesity, depression or many other causes. Labs will be ordered, and in the meanwhile, Zenola will focus on self care including making healthy food choices, increasing physical activity and focusing on stress reduction.  - VITAMIN D 25 Hydroxy (Vit-D Deficiency, Fractures) - Insulin, random - Folate - Chloride  2. SOBOE (shortness of breath on exertion) Manuelita does feel that she gets out of breath more easily that she used to when she exercises. Betty Leonard's shortness of breath appears to be obesity related and exercise induced. She has agreed to work on weight loss and gradually  increase exercise to treat her exercise induced shortness of breath. Will continue to monitor closely.  3. Vitamin D deficiency We will recheck labs today with a goal of >50.0.  - VITAMIN D 25 Hydroxy (Vit-D Deficiency, Fractures)  4. Anxiety and depression Patient was referred to Dr. Dewaine Conger, our Bariatric Psychologist, for cognitive behavioral therapy; patient declined for  now.  5. Other hyperlipidemia Patient will begin a low saturated fat diet, and we will recheck FLP in 6 months.  6. Depression screen Rana had a positive depression screening. Depression is commonly associated with obesity and often results in emotional eating behaviors. We will monitor this closely and work on CBT to help improve the non-hunger eating patterns. Referral to Psychology may be required if no improvement is seen as she continues in our clinic.  7. BMI 37.0-37.9, adult  8. Generalized obesity with starting BMI 37.5 Betty Leonard is currently in the action stage of change and her goal is to continue with weight loss efforts. I recommend [name] begin the structured treatment plan as follows:  She has agreed to the Category 2 Plan.  100 snack calorie list was given.   Exercise goals: All adults should avoid inactivity. Some physical activity is better than none, and adults who participate in any amount of physical activity gain some health benefits.   Behavioral modification strategies: increasing lean protein intake, increasing vegetables, increasing water intake, decreasing liquid calories, decreasing eating out, no skipping meals, keeping healthy foods in the home, and planning for success.  She was informed of the importance of frequent follow-up visits to maximize her success with intensive lifestyle modifications for her multiple health conditions. She was informed we would discuss her lab results at her next visit unless there is a critical issue that needs to be addressed sooner. Betty Leonard agreed to keep her next visit at the agreed upon time to discuss these results.  Objective:   Blood pressure 115/83, pulse 83, temperature 97.7 F (36.5 C), height 5\' 6"  (1.676 m), weight 232 lb (105.2 kg), SpO2 97%. Body mass index is 37.45 kg/m.  EKG: Normal sinus rhythm, rate (unable to obtain).  Indirect Calorimeter completed today shows a VO2 of 226 and a REE of 1555.  Her calculated basal  metabolic rate is 9629 thus her basal metabolic rate is worse than expected.  General: Cooperative, alert, well developed, in no acute distress. HEENT: Conjunctivae and lids unremarkable. Cardiovascular: Regular rhythm.  Lungs: Normal work of breathing. Neurologic: No focal deficits.   Lab Results  Component Value Date   CREATININE 0.69 05/20/2022   BUN 14 05/20/2022   NA 141 05/20/2022   K 4.5 05/20/2022   CL 104 07/29/2022   CO2 20 05/20/2022   Lab Results  Component Value Date   ALT 9 05/20/2022   AST 10 05/20/2022   ALKPHOS 69 05/20/2022   BILITOT 0.5 05/20/2022   Lab Results  Component Value Date   HGBA1C 5.4 05/20/2022   HGBA1C 5.5 07/28/2012   Lab Results  Component Value Date   INSULIN 14.4 07/29/2022   Lab Results  Component Value Date   TSH 0.849 05/20/2022   Lab Results  Component Value Date   CHOL 191 05/20/2022   HDL 51 05/20/2022   LDLCALC 117 (H) 05/20/2022   TRIG 127 05/20/2022   CHOLHDL 3.7 05/20/2022   Lab Results  Component Value Date   WBC 9.2 05/20/2022   HGB 14.3 05/20/2022   HCT 44.0 05/20/2022   MCV 85 05/20/2022  PLT 390 05/20/2022   Lab Results  Component Value Date   IRON 71 05/20/2022   TIBC 260 05/20/2022   FERRITIN 299 (H) 05/20/2022   Attestation Statements:   Reviewed by clinician on day of visit: allergies, medications, problem list, medical history, surgical history, family history, social history, and previous encounter notes.  Time spent on visit including pre-visit chart review and post-visit charting and care was 40 minutes.   Trude Mcburney, am acting as transcriptionist for Seymour Bars, DO.  I have reviewed the above documentation for accuracy and completeness, and I agree with the above. Seymour Bars, DO

## 2022-08-12 ENCOUNTER — Encounter (HOSPITAL_BASED_OUTPATIENT_CLINIC_OR_DEPARTMENT_OTHER): Payer: Self-pay | Admitting: Physical Therapy

## 2022-08-12 ENCOUNTER — Ambulatory Visit (HOSPITAL_BASED_OUTPATIENT_CLINIC_OR_DEPARTMENT_OTHER): Payer: 59 | Admitting: Physical Therapy

## 2022-08-12 DIAGNOSIS — M25551 Pain in right hip: Secondary | ICD-10-CM | POA: Diagnosis not present

## 2022-08-12 DIAGNOSIS — R2689 Other abnormalities of gait and mobility: Secondary | ICD-10-CM

## 2022-08-12 DIAGNOSIS — R531 Weakness: Secondary | ICD-10-CM

## 2022-08-12 NOTE — Therapy (Signed)
OUTPATIENT PHYSICAL THERAPY LOWER EXTREMITY EVALUATION   Patient Name: Betty Leonard MRN: [id number] DOB:28-Feb-1980, 42 y.o., female Today's Date: 08/12/2022  END OF SESSION:  PT End of Session - 08/12/22 1617     Visit Number 6    Number of Visits 8    Date for PT Re-Evaluation 08/25/22    Authorization Type [employer]    PT Start Time 1555    PT Stop Time 1635    PT Time Calculation (min) 40 min    Activity Tolerance Patient tolerated treatment well    Behavior During Therapy [facility] for tasks assessed/performed             Past Medical History:  Diagnosis Date   Anemia    iron infusion sept 2020   Anxiety    Chicken pox    Depression    Esophagitis 12/24/2011   GERD (gastroesophageal reflux disease)    History of wheezing 06/06/2018   Joint pain    Menorrhagia    with irregular cycles   Migraines    Multiple food allergies    Pneumonia 07/2018   Recurrent acute suppurative otitis media of right ear without spontaneous rupture of tympanic membrane 08/05/2021   Seasonal allergies    Sinusitis 11/12/2021   UTI (urinary tract infection) finished antibiotic 04-11-2019   Vertigo 10/05/2021   Vitamin D deficiency    Past Surgical History:  Procedure Laterality Date   ESOPHAGOGASTRODUODENOSCOPY  12/24/2011   Procedure: ESOPHAGOGASTRODUODENOSCOPY (EGD);  Surgeon: Louis Meckel, MD;  Location: [facility];  Service: Endoscopy;  Laterality: N/A;   FOOT SURGERY Right 2009   repair remotely after a fracture   FOOT SURGERY Right 1'2020   FOOT SURGERY Right 2023   HYSTEROSCOPY WITH NOVASURE N/A 04/25/2019   Procedure: HYSTEROSCOPY WITH NOVASURE ABLATION;  Surgeon: Jerene Bears, MD;  Location: [facility];  Service: Gynecology;  Laterality: N/A;   KNEE ARTHROSCOPY Left 2018   Patient Active Problem List   Diagnosis Date Noted   Vitamin D deficiency 07/29/2022   SOBOE (shortness of breath on exertion) 07/29/2022   Other fatigue 07/29/2022   Other  hyperlipidemia 07/29/2022   BMI 37.0-37.9, adult 07/29/2022   Generalized obesity with starting BMI 37.5 07/29/2022   Depression screen 07/29/2022   Chronic foot pain, right 07/02/2022   Eating disorder 07/02/2022   Injury of left hand 02/02/2022   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 Arlyce Dice and GI 12/16/2011    PCP: Tollie Eth, NP  REFERRING PROVIDER: Huel Cote, MD  REFERRING DIAG: [id number] (ICD-10-CM) - Tendinopathy of right gluteus medius  THERAPY DIAG:  Pain in right hip  Other abnormalities of gait and mobility  Weakness  Rationale for Evaluation and Treatment: Rehabilitation  ONSET DATE: ~2 weeks ago  SUBJECTIVE:   SUBJECTIVE STATEMENT: The patient went on vacation She did a lot of walking. She has some tightness in the anterior hip today. Otherwise she did well. Her shoulder pain has resolved.    PERTINENT HISTORY: 3 foot reconstructions, L shoulder thoracic outlet syndrome  PAIN:  Are you having pain? Yes: NPRS scale: 3/10  currently; at most 10/10 Pain location: R side of hip Pain description: Can radiate to groin occasionally Aggravating factors: going up/down stairs, getting out of bed, stepping over, laying flat, can only sit for ~1 hr Relieving factors: Pt has done stretches  PRECAUTIONS: None  WEIGHT BEARING RESTRICTIONS: No  FALLS:  Has patient fallen in last 6 months? No  LIVING ENVIRONMENT: Lives with: lives alone Lives in: House/apartment Stairs: Yes: External: 20 steps; none Has following equipment at home:  None  OCCUPATION: Accounting -- sitting desk work  PLOF: Independent  PATIENT GOALS: Improve pain with mobility  NEXT MD VISIT: Dr. Steward Drone 08/28/22  OBJECTIVE:   DIAGNOSTIC FINDINGS: R hip MRI 06/17/22 IMPRESSION: 1. No hip fracture, dislocation or avascular necrosis. 2. Right anterior labral tear. Undersurface detachment of the right posterior superior labrum. 3. Mild stenosis of the right gluteus minimus tendon.  PATIENT SURVEYS:  LEFS: 61.3%  COGNITION: Overall cognitive status: Within functional limits for tasks assessed     SENSATION: WFL  EDEMA:  None noted  MUSCLE LENGTH: Hamstrings: Right 90 deg; Left 90 deg Thomas test: Did not assess  POSTURE: left pelvic obliquity and weight shift left  PALPATION: TTP R glute med and TFL  LOWER EXTREMITY ROM:  Active ROM Right eval Left eval  Hip flexion    Hip extension    Hip abduction    Hip adduction    Hip internal rotation    Hip external rotation    Knee flexion    Knee extension    Ankle dorsiflexion    Ankle plantarflexion    Ankle inversion    Ankle eversion     (Blank rows = not tested)  LOWER EXTREMITY MMT:  MMT Right eval Left eval  Hip flexion 5* 5  Hip extension 5 5  Hip abduction 4* 5  Hip adduction    Hip internal rotation 4 5  Hip external rotation 4+ 5  Knee flexion 5 5  Knee extension 5 5  Ankle dorsiflexion    Ankle plantarflexion    Ankle inversion    Ankle eversion     (Blank rows = not tested) * = concordant pain  LOWER EXTREMITY SPECIAL TESTS:  Hip special tests: Luisa Hart (FABER) test: positive , Hip scouring test: positive , and Anterior hip impingement test: positive   FUNCTIONAL TESTS:  Did not assess  GAIT: Distance walked: 100' Assistive device utilized: None Level of assistance: Complete Independence Comments: WFL   TODAY'S TREATMENT:                                                                                                                               DATE: 06/29/22 7/25 Manual: roller to anterior hip; trigger point release to anterior hip. LAD Grade II and III  SAQ 3x10  LAQ 3x10  Hip abduction red 3x10   Standing weight shift x 20 standing march 2  x 15  6/24 Manual: trigger point release to lateral gluteal in side lying; grade I lad with oscillations for inflammation. Improved pain noted. R Trigger point release to anterior hip. Gentre PROM into flexion ER and IR avoiding pain  PPT x20 Reviewed pronelying for home   Quad set 2x20  SAQ 2x20  Nu-step 5 min L2   LAQ 2x10  Seated hip abduction 2x10 yellow   Ball stretch 10x 10 sec hold  Lateral 5x each 10 sec hold   Standing weight shift x20   Reviewed HEP and how to use at home    6/19 Manual: trigger point release to lateral gluteal in side lying; grade I lad with oscillations for inflammation. Improved pain noted. R Trigger point release to anterior hip. Gentre PROM into flexion ER and IR avoiding pain  Bent knee fallout 2x10 PPT x20  Prone lying 2 min  Prone hamstring curl x20   Standing anterior hip stretch 1x 10 sec had increased pain so held    6/18  Manual: trigger point release to lateral gluteal in side lying; grade I lad with oscillations for inflammation. Improved pain noted. R Trigger point release to anterior hip. Gentre PROM into flexion ER and IR avoiding pain  Reviewed how to go up and down the steps to reduce inflammation.   PPT x20  LTR in low range x20  Standing weight shifts x20   Reviewed and updated HEP     Eval: See HEP below    PATIENT EDUCATION:  Education details: Exam findings, POC, modify HEP Person educated: Patient Education method: Explanation, Demonstration, and Handouts Education comprehension: verbalized understanding, returned demonstration, and needs further education  HOME EXERCISE PROGRAM: Access Code: [email or web address] URL: [email or web address] Date: 06/29/2022 Prepared by: Vernon Prey April Kirstie Peri  Exercises - Seated Long Arc Quad  - 5 x daily - 7 x weekly - 1 sets - 5 reps - Prone Heel Squeeze  - 1 x daily - 7 x weekly - 2 sets - 10 reps - 3 sec hold - Prone Hip Internal Rotation AROM  - 1 x daily - 7 x weekly - 2 sets - 10 reps - Hooklying Bilateral Isometric Clamshell  - 1 x daily - 7 x weekly - 2 sets - 10 reps - 3 sec hold - Prone Hip Extension - One Pillow  - 1 x daily - 7 x weekly - 2 sets - 10 reps - 3 sec hold - Supine Posterior Pelvic Tilt  - 1 x daily - 7 x weekly - 2 sets - 10 reps  ASSESSMENT:  CLINICAL IMPRESSION: Overall the patient continues to make progress.  She had anterior hip tightness today.  She reported improved tightness with manual therapy.  She was shown areas to focus on on her own at home for anterior hip tightness.  We worked on general quad strengthening and hip strengthening today.  She was given standing exercises for home to work on.  Her shoulder has resolved.  She has 1 more visit scheduled we will likely discharge next visit if her symptoms remain stable.   Eval: Patient is a 42 y.o. F who was seen today for physical therapy evaluation and treatment for R hip pain. MRI indicates R hip labral tear. Assessment significant for R glute, hip IR and ER weakness/instability with increased groin pain noted. Pt would benefit from PT to trial conservative therapy before consideration of surgery. Initiated gentle isometric exercises as tolerated today.   OBJECTIVE IMPAIRMENTS: decreased activity  tolerance, decreased balance, decreased endurance, decreased mobility, decreased ROM, decreased strength, increased fascial restrictions, increased muscle spasms, impaired flexibility, improper body mechanics, postural dysfunction, and pain.   ACTIVITY LIMITATIONS: carrying, lifting, bending, sitting, squatting, stairs, transfers, bed mobility, bathing, toileting, hygiene/grooming, and locomotion level  PARTICIPATION LIMITATIONS: cleaning, laundry, driving,  shopping, community activity, occupation, and yard work  PERSONAL FACTORS: Fitness, Past/current experiences, and Time since onset of injury/illness/exacerbation are also affecting patient's functional outcome.   REHAB POTENTIAL: Good  CLINICAL DECISION MAKING: Stable/uncomplicated  EVALUATION COMPLEXITY: Low   GOALS: Goals reviewed with patient? Yes  SHORT TERM GOALS: Target date: 07/27/2022  Pt will be ind with initial HEP Baseline: Goal status: INITIAL  2.  Pt will be able to perform bed transfers with </=2/10 pain Baseline:  Goal status: INITIAL    LONG TERM GOALS: Target date: 08/25/2022   Pt will be ind with management and progression of HEP Baseline:  Goal status: INITIAL  2.  Pt will be able to ascend/descend stairs holding on to at least 50# to carry her dog with </=2/10 pain Baseline:  Goal status: INITIAL  3.  Pt will be able to tolerate sitting >1 hour for her work tasks Baseline:  Goal status: INITIAL  4.  Pt will have improved LEFS to >/=71.3% to demo MCID Baseline:  Goal status: INITIAL    PLAN:  PT FREQUENCY: 1x/week  PT DURATION: 8 weeks  PLANNED INTERVENTIONS: Therapeutic exercises, Therapeutic activity, Neuromuscular re-education, Balance training, Gait training, Patient/Family education, Self Care, Joint mobilization, Stair training, Aquatic Therapy, Dry Needling, Electrical stimulation, Taping, Vasopneumatic device, Ionotophoresis 4mg /ml Dexamethasone, Manual therapy, and Re-evaluation  PLAN FOR NEXT SESSION: Assess response to HEP. Continue isometric strengthening and progress as tolerated. Manual work if indicated on glute med  Lorayne Bender PT DPT 08/12/2022, 4:18 PM

## 2022-08-13 ENCOUNTER — Encounter (HOSPITAL_BASED_OUTPATIENT_CLINIC_OR_DEPARTMENT_OTHER): Payer: Self-pay | Admitting: Physical Therapy

## 2022-08-18 ENCOUNTER — Encounter (HOSPITAL_BASED_OUTPATIENT_CLINIC_OR_DEPARTMENT_OTHER): Payer: 59 | Admitting: Physical Therapy

## 2022-08-19 ENCOUNTER — Ambulatory Visit (INDEPENDENT_AMBULATORY_CARE_PROVIDER_SITE_OTHER): Payer: 59 | Admitting: Family Medicine

## 2022-08-20 ENCOUNTER — Encounter: Payer: Self-pay | Admitting: Internal Medicine

## 2022-08-20 ENCOUNTER — Ambulatory Visit: Payer: 59 | Attending: Internal Medicine | Admitting: Internal Medicine

## 2022-08-20 VITALS — BP 110/76 | HR 84 | Resp 16 | Ht 67.0 in | Wt 235.0 lb

## 2022-08-20 DIAGNOSIS — M2550 Pain in unspecified joint: Secondary | ICD-10-CM | POA: Diagnosis not present

## 2022-08-20 DIAGNOSIS — L659 Nonscarring hair loss, unspecified: Secondary | ICD-10-CM

## 2022-08-20 DIAGNOSIS — R899 Unspecified abnormal finding in specimens from other organs, systems and tissues: Secondary | ICD-10-CM

## 2022-08-20 DIAGNOSIS — G8929 Other chronic pain: Secondary | ICD-10-CM | POA: Insufficient documentation

## 2022-08-20 DIAGNOSIS — R200 Anesthesia of skin: Secondary | ICD-10-CM

## 2022-08-20 DIAGNOSIS — M25551 Pain in right hip: Secondary | ICD-10-CM

## 2022-08-20 NOTE — Progress Notes (Signed)
Office Visit Note  Patient: Betty Leonard             Date of Birth: July 02, 1980           MRN: [id number]             PCP: Tollie Eth, NP Referring: Tollie Eth, NP Visit Date: 08/20/2022   Subjective:  New Patient (Initial Visit) (Patient states she is having most of her joint pain in her left shoulder and right hip. Patient states she has hand and feet numbness. )   History of Present Illness: Betty Leonard is a 42 y.o. female here for evaluation with multiple chronic symptoms including arthralgias, alopecia, paresthesias, and chronic fatigue concern for possible underlying inflammatory condition.  She has a previous history concerning for Gaucher disease due to family history although was negative based on genetic testing earlier this year. Chronic joint pain in multiple areas has been going on for many years.  Worse problems involving her right hip and in the left shoulder.  A lot of trouble with her right leg previous injury and surgery involving removal of second and third metatarsal heads.  New problem acutely developed severe right hip pain problem that started in May started while bending and saw orthopedic surgery clinic for evaluation.  Right hip pain evaluated in May with MRI demonstrating anterior labral tear and mild glute medius tendon stenosis.  Participating with physical therapy working on this appears a lot of the gluteal muscle probably secondary to compensation with her multiple foot surgery and reconstructions. Left shoulder has had complications for thoracic outlet syndrome.  This was treated with physical therapy previously with good benefit.  Subsequent plan for nerve block but is thought to have some multifactorial contribution so not recommended of first rib resection. She gets a partial benefit with over-the-counter NSAIDs but does not feel it is a very adequate relief.  Frequently uses a heating pad beneficial while she uses it but without a prolonged  benefit. Outside these problems does get somewhat widespread joint and muscle pain varying in severity.  She is chronically fatigued sometimes with excessive sleepiness but also has fatigue not relieved after sleeping.  Has been noticing generalized alopecia no focal areas of hair loss and no scalp rashes or lesions.  Skin is easily irritated with some erythema and discomfort provoked during even short periods of sun exposure.   Imaging reviewed 06/17/22 MRI Right Hip IMPRESSION: 1. No hip fracture, dislocation or avascular necrosis. 2. Right anterior labral tear. Undersurface detachment of the right posterior superior labrum. 3. Mild stenosis of the right gluteus minimus tendon.  Activities of Daily Living:  Patient reports morning stiffness for 30-60 minutes.   Patient Denies nocturnal pain.  Difficulty dressing/grooming: Denies Difficulty climbing stairs: Denies Difficulty getting out of chair: Denies Difficulty using hands for taps, buttons, cutlery, and/or writing: Denies  Review of Systems  Constitutional:  Positive for fatigue.  HENT:  Negative for mouth sores and mouth dryness.   Eyes:  Negative for dryness.  Respiratory:  Negative for shortness of breath.   Cardiovascular:  Negative for chest pain and palpitations.  Gastrointestinal:  Negative for blood in stool, constipation and diarrhea.  Endocrine: Negative for increased urination.  Genitourinary:  Negative for involuntary urination.  Musculoskeletal:  Positive for joint pain, gait problem, joint pain, joint swelling, myalgias, muscle weakness, morning stiffness, muscle tenderness and myalgias.  Skin:  Positive for hair loss and sensitivity to sunlight. Negative for color change  and rash.  Allergic/Immunologic: Positive for susceptible to infections.  Neurological:  Positive for headaches. Negative for dizziness.  Hematological:  Negative for swollen glands.  Psychiatric/Behavioral:  Positive for depressed mood and sleep  disturbance. The patient is nervous/anxious.     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 Arlyce Dice and GI 12/16/2011    Past Medical History:  Diagnosis Date   Anemia    iron infusion sept 2020   Anxiety    Chicken pox    Depression    Esophagitis 12/24/2011   Exhaustion    Falling    GERD (gastroesophageal reflux disease)    History of wheezing 06/06/2018   Joint pain    Menorrhagia    with irregular cycles   Migraines    Multiple food allergies    Numbness    hands and feet   Pneumonia 07/2018   Recurrent acute suppurative otitis media of right ear without spontaneous rupture of tympanic membrane 08/05/2021   Seasonal allergies    Sinusitis 11/12/2021   Torn muscle    right hip   UTI (urinary tract infection) finished antibiotic 04-11-2019   Vertigo 10/05/2021   Vitamin D deficiency     Family History  Problem Relation Age of Onset   Leukemia Paternal Grandfather    Lung cancer  Paternal Grandfather    Arthritis Paternal Grandfather    Diabetes Paternal Grandfather    Heart disease Paternal Grandfather    Hypertension Paternal Grandfather    Kidney disease Paternal Grandfather    Prostate cancer Paternal Grandfather    Gaucher's disease Paternal Grandfather    Sudden death Father        age 54, nothing found on autopsy   Diabetes Father    Hyperlipidemia Father    Hypertension Father    Mental illness Mother        bipolar, schizophrenia   Alcohol abuse Maternal Grandfather    Stroke Paternal Grandmother    Colon cancer Neg Hx    Past Surgical History:  Procedure Laterality Date   ESOPHAGOGASTRODUODENOSCOPY  12/24/2011   Procedure: ESOPHAGOGASTRODUODENOSCOPY (EGD);  Surgeon: Louis Meckel, MD;  Location: [facility];  Service: Endoscopy;  Laterality: N/A;   FOOT SURGERY Right 2009   repair remotely after a fracture   FOOT SURGERY Right 1'2020   FOOT SURGERY Right 2023   HYSTEROSCOPY WITH NOVASURE N/A 04/25/2019   Procedure: HYSTEROSCOPY WITH NOVASURE ABLATION;  Surgeon: Jerene Bears, MD;  Location: [facility];  Service: Gynecology;  Laterality: N/A;   KNEE ARTHROSCOPY Left 2018   Social History   Social History Narrative   Work or Holiday representative, [employer]      Home Situation: lives with roomate      Spiritual Beliefs: Christian      Lifestyle: CV exercise ( ) 3 days per week; diet is poor               Immunization History  Administered Date(s) Administered   PFIZER(Purple Top)SARS-COV-2 Vaccination 04/08/2019, 05/08/2019   Pneumococcal Polysaccharide-23 08/07/2013   Tdap 09/19/2020     Objective: Vital Signs: BP 110/76 (BP Location: Right Arm, Patient Position: Sitting, Cuff Size: Normal)   Pulse 84   Resp 16   Ht 5\' 7"  (1.702 m)   Wt 235 lb (106.6 kg)   BMI 36.81 kg/m    Physical Exam HENT:     Mouth/Throat:     Mouth: Mucous membranes are moist.     Pharynx: Oropharynx is clear.  Eyes:      Conjunctiva/sclera: Conjunctivae normal.  Cardiovascular:     Rate and Rhythm: Normal rate and regular rhythm.  Pulmonary:     Effort: Pulmonary effort is normal.     Breath sounds: Normal breath sounds.  Musculoskeletal:     Right lower leg: No edema.     Left lower leg: No edema.  Lymphadenopathy:     Cervical: No cervical adenopathy.  Skin:    General: Skin is warm and dry.     Findings: No rash.  Neurological:     Mental Status: She is alert.  Psychiatric:        Mood and Affect: Mood normal.      Musculoskeletal Exam:  Left shoulder pain with overhead abduction and with external rotation, symptom extending into upper arm, lateral tenderness to pressure without any palpable swelling Elbows full ROM no tenderness or swelling Wrists full ROM no tenderness or swelling Fingers full ROM no tenderness or swelling Right hip pain provoked with internal/external rotation and with lateral tenderness to pressure around greater trochanter Knees full ROM no tenderness or swelling Right foot postsurgical changes, decreased ROM, no palpable swelling  Investigation: No additional findings.  Imaging: DG Foot Complete Right  Result Date: 08/27/2022 CLINICAL DATA:  Pain in right foot. EXAM: RIGHT FOOT COMPLETE - 3+ VIEW COMPARISON:  Radiograph 10/08/2021 FINDINGS: Plate and screw fixation of the proximal first metatarsal. Postsurgical change of the second and third metatarsal heads. Resection margins are smooth. Minor degenerative change of the first metatarsal phalangeal joint. No fracture. No erosive change. No focal soft tissue abnormalities. IMPRESSION: Postsurgical change of the first through third metatarsals. Minor degenerative change of the first metatarsophalangeal joint. Electronically Signed   By: Narda Rutherford M.D.   On: 08/27/2022 23:26    Recent Labs: Lab Results  Component Value Date   WBC 9.2 05/20/2022   HGB 14.3 05/20/2022   PLT 390 05/20/2022   NA 141 05/20/2022   K  4.5 05/20/2022   CL 104 07/29/2022   CO2 20 05/20/2022   GLUCOSE 87 05/20/2022   BUN 14 05/20/2022   CREATININE 0.69 05/20/2022   BILITOT 0.5 05/20/2022   ALKPHOS 69 05/20/2022   AST 10 05/20/2022   ALT 9 05/20/2022   PROT 7.0 05/20/2022   ALBUMIN 4.4 05/20/2022   CALCIUM 9.8 05/20/2022   GFRAA >60 10/04/2019    Speciality Comments: No specialty comments available.  Procedures:  No procedures performed Allergies: Codeine, Hydrocodone, Iodine, Shellfish allergy,  and Aspirin   Assessment / Plan:     Visit Diagnoses: Polyarthralgia - Plan: ANA, C3 and C4, Sedimentation rate, C-reactive protein, IgG, IgA, IgM  Chronic joint pain in multiple areas also with fatigue body muscle aches and paresthesias ongoing for years.  Had a negative workup for Gaucher's despite family history for this.  Clinically I do not see any definite joint synovitis or criteria for autoimmune connective tissue disease.  Will check antibody panel today including ANA sed rate CRP complements and serum immunoglobulins.  Hair loss  Generalized hair thinning with no focal lesions.  Could be nutritional with some eating disorder problems with a very inconsistent diet due to GI intolerance problems.  Has had associated history of iron deficiency.  Alternatively does have some anxiety and depression symptoms could contribute such as telogen effluvium.  Chronic right hip pain  Acute on chronic problem looks like some detachment and labral tear probably with chronic gluteal tendinosis due to use related and compensation with abnormal foot mobility and pain.  Hand numbness  Appears better explained by impingement associated with thoracic outlet syndrome.  Does not have peripheral involvement in other extremities so peripheral neuropathy appears less likely.  No focal symptoms to provocative maneuvers in elbow or wrist.  Orders: Orders Placed This Encounter  Procedures   ANA   C3 and C4   Sedimentation rate    C-reactive protein   IgG, IgA, IgM   Anti-nuclear ab-titer (ANA titer)   No orders of the defined types were placed in this encounter.    Follow-Up Instructions: No follow-ups on file.   Fuller Plan, MD  Note - This record has been created using [employer].  Chart creation errors have been sought, but may not always  have been located. Such creation errors do not reflect on  the standard of medical care.

## 2022-08-21 ENCOUNTER — Encounter (HOSPITAL_BASED_OUTPATIENT_CLINIC_OR_DEPARTMENT_OTHER): Payer: Self-pay | Admitting: Student

## 2022-08-21 ENCOUNTER — Ambulatory Visit (HOSPITAL_BASED_OUTPATIENT_CLINIC_OR_DEPARTMENT_OTHER): Payer: 59 | Admitting: Student

## 2022-08-21 ENCOUNTER — Encounter (HOSPITAL_BASED_OUTPATIENT_CLINIC_OR_DEPARTMENT_OTHER): Payer: Self-pay | Admitting: Physical Therapy

## 2022-08-21 ENCOUNTER — Ambulatory Visit (INDEPENDENT_AMBULATORY_CARE_PROVIDER_SITE_OTHER): Payer: 59

## 2022-08-21 ENCOUNTER — Ambulatory Visit (HOSPITAL_BASED_OUTPATIENT_CLINIC_OR_DEPARTMENT_OTHER): Payer: 59 | Attending: Orthopaedic Surgery | Admitting: Physical Therapy

## 2022-08-21 DIAGNOSIS — M79671 Pain in right foot: Secondary | ICD-10-CM

## 2022-08-21 DIAGNOSIS — R531 Weakness: Secondary | ICD-10-CM | POA: Insufficient documentation

## 2022-08-21 DIAGNOSIS — M25551 Pain in right hip: Secondary | ICD-10-CM | POA: Insufficient documentation

## 2022-08-21 DIAGNOSIS — R2689 Other abnormalities of gait and mobility: Secondary | ICD-10-CM | POA: Diagnosis present

## 2022-08-21 NOTE — Therapy (Signed)
OUTPATIENT PHYSICAL THERAPY LOWER EXTREMITY EVALUATION   Patient Name: Betty Leonard MRN: [id number] DOB:05/30/1980, 42 y.o., female Today's Date: 08/21/2022  END OF SESSION:    Past Medical History:  Diagnosis Date   Anemia    iron infusion sept 2020   Anxiety    Chicken pox    Depression    Esophagitis 12/24/2011   Exhaustion    Falling    GERD (gastroesophageal reflux disease)    History of wheezing 06/06/2018   Joint pain    Menorrhagia    with irregular cycles   Migraines    Multiple food allergies    Numbness    hands and feet   Pneumonia 07/2018   Recurrent acute suppurative otitis media of right ear without spontaneous rupture of tympanic membrane 08/05/2021   Seasonal allergies    Sinusitis 11/12/2021   Torn muscle    right hip   UTI (urinary tract infection) finished antibiotic 04-11-2019   Vertigo 10/05/2021   Vitamin D deficiency    Past Surgical History:  Procedure Laterality Date   ESOPHAGOGASTRODUODENOSCOPY  12/24/2011   Procedure: ESOPHAGOGASTRODUODENOSCOPY (EGD);  Surgeon: Louis Meckel, MD;  Location: [facility];  Service: Endoscopy;  Laterality: N/A;   FOOT SURGERY Right 2009   repair remotely after a fracture   FOOT SURGERY Right 1'2020   FOOT SURGERY Right 2023   HYSTEROSCOPY WITH NOVASURE N/A 04/25/2019   Procedure: HYSTEROSCOPY WITH NOVASURE ABLATION;  Surgeon: Jerene Bears, MD;  Location: [facility];  Service: Gynecology;  Laterality: N/A;   KNEE ARTHROSCOPY Left 2018   Patient Active Problem List   Diagnosis Date Noted   Chronic right hip pain 08/20/2022   Hand numbness 08/20/2022   Polyarthralgia 08/20/2022   Abnormal laboratory test result 08/20/2022   Vitamin D deficiency 07/29/2022   SOBOE (shortness of breath on exertion) 07/29/2022   Other fatigue 07/29/2022   Other hyperlipidemia 07/29/2022   BMI 37.0-37.9, adult 07/29/2022   Generalized obesity with starting BMI 37.5 07/29/2022   Depression screen  07/29/2022   Chronic foot pain, right 07/02/2022   Eating disorder 07/02/2022   Injury of left hand 02/02/2022   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 Arlyce Dice and GI 12/16/2011    PCP: Tollie Eth, NP  REFERRING PROVIDER: Huel Cote, MD  REFERRING DIAG: [phone] (ICD-10-CM) - Tendinopathy of right gluteus medius  THERAPY DIAG:  No diagnosis found.  Rationale for Evaluation and Treatment: Rehabilitation  ONSET DATE: ~2 weeks ago  SUBJECTIVE:   SUBJECTIVE STATEMENT: The patient had a fall. She has significant pain and swelling in her foot. She also has some pain in her hip. She has seen [employer].   PERTINENT HISTORY: 3 foot reconstructions, L shoulder thoracic outlet syndrome  PAIN:  Are you having pain? Yes: NPRS scale: 3/10  currently; at most 10/10 Pain location: R side of hip Pain description: Can radiate to groin occasionally Aggravating factors: going up/down stairs, getting out of bed, stepping over, laying flat, can only sit for ~1 hr Relieving factors: Pt has done stretches  PRECAUTIONS: None  WEIGHT BEARING RESTRICTIONS: No  FALLS:  Has patient fallen in last 6 months? No  LIVING ENVIRONMENT: Lives with: lives alone Lives in: House/apartment Stairs: Yes: External: 20 steps; none Has following equipment at home: None  OCCUPATION: Accounting -- sitting desk work  PLOF: Independent  PATIENT GOALS: Improve pain with mobility  NEXT MD VISIT: Dr. Steward Drone 08/28/22  OBJECTIVE:   DIAGNOSTIC FINDINGS: R hip MRI 06/17/22  IMPRESSION: 1. No hip fracture, dislocation or avascular necrosis. 2. Right anterior labral tear. Undersurface detachment of the right posterior superior labrum. 3. Mild stenosis of the right gluteus minimus tendon.  PATIENT SURVEYS:  LEFS: 61.3%  COGNITION: Overall cognitive status: Within functional limits for tasks assessed     SENSATION: WFL  EDEMA:  None noted  MUSCLE LENGTH: Hamstrings: Right 90 deg; Left 90 deg Thomas test: Did not assess  POSTURE: left pelvic obliquity and weight shift left  PALPATION: TTP R glute med and TFL  LOWER EXTREMITY ROM:  Active ROM Right eval Left eval  Hip flexion    Hip extension    Hip abduction    Hip adduction    Hip internal rotation    Hip external rotation    Knee flexion    Knee extension    Ankle dorsiflexion    Ankle plantarflexion    Ankle inversion    Ankle eversion     (Blank rows = not tested)  LOWER EXTREMITY MMT:  MMT Right eval Left eval  Hip flexion 5* 5  Hip extension 5 5  Hip abduction 4* 5  Hip adduction    Hip internal rotation 4 5  Hip external rotation 4+ 5  Knee flexion 5 5  Knee extension 5 5  Ankle dorsiflexion    Ankle plantarflexion    Ankle inversion    Ankle eversion     (Blank rows = not tested) * = concordant pain  LOWER EXTREMITY SPECIAL TESTS:  Hip special tests: Luisa Hart (FABER) test: positive , Hip scouring test: positive , and Anterior hip impingement test: positive   FUNCTIONAL TESTS:  Did not assess  GAIT: Distance walked: 100' Assistive device utilized: None Level of assistance: Complete Independence Comments: WFL   TODAY'S TREATMENT:                                                                                                                              DATE: 06/29/22 8/2 Manual: roller to anterior hip; trigger point release to anterior hip. LAD Grade II and III; side lying gluteal release  Quad set 3x15  SAQ 3x15  Hip abduction 3x15    7/25 Manual:  roller to anterior hip; trigger point release to anterior hip. LAD Grade II and III  SAQ 3x10  LAQ 3x10  Hip abduction red 3x10   Standing weight shift x 20 standing march 2 x 15  6/24 Manual: trigger point release to lateral gluteal in side lying; grade I lad  with oscillations for inflammation. Improved pain noted. R Trigger point release to anterior hip. Gentre PROM into flexion ER and IR avoiding pain  PPT x20 Reviewed pronelying for home   Quad set 2x20  SAQ 2x20  Nu-step 5 min L2   LAQ 2x10  Seated hip abduction 2x10 yellow   Ball stretch 10x 10 sec hold  Lateral 5x each 10 sec hold   Standing weight shift x20   Reviewed HEP and how to use at home    6/19 Manual: trigger point release to lateral gluteal in side lying; grade I lad with oscillations for inflammation. Improved pain noted. R Trigger point release to anterior hip. Gentre PROM into flexion ER and IR avoiding pain  Bent knee fallout 2x10 PPT x20  Prone lying 2 min  Prone hamstring curl x20   Standing anterior hip stretch 1x 10 sec had increased pain so held      Eval: See HEP below    PATIENT EDUCATION:  Education details: Exam findings, POC, modify HEP Person educated: Patient Education method: Explanation, Demonstration, and Handouts Education comprehension: verbalized understanding, returned demonstration, and needs further education  HOME EXERCISE PROGRAM: Access Code: [email or web address] URL: [email or web address] Date: 06/29/2022 Prepared by: Vernon Prey April Kirstie Peri  Exercises - Seated Long Arc Quad  - 5 x daily - 7 x weekly - 1 sets - 5 reps - Prone Heel Squeeze  - 1 x daily - 7 x weekly - 2 sets - 10 reps - 3 sec hold - Prone Hip Internal Rotation AROM  - 1 x daily - 7 x weekly - 2 sets - 10 reps - Hooklying Bilateral Isometric Clamshell  - 1 x daily - 7 x weekly - 2 sets - 10 reps - 3 sec hold - Prone Hip Extension - One Pillow  - 1 x daily - 7 x weekly - 2 sets - 10 reps  - 3 sec hold - Supine Posterior Pelvic Tilt  - 1 x daily - 7 x weekly - 2 sets - 10 reps  ASSESSMENT:  CLINICAL IMPRESSION: The patient had spasming in the anterior and lateral hip but it was reasonable. She tolerated treatment well. She reported it felt looser after. She tolerated exercises well. She feels like the way she is walking is effecting her hip. She feels like when her foot heals her hip will be fine. She will continue PT PRN.   Eval: Patient is a 42 y.o. F who was seen today for physical therapy evaluation and treatment for R hip pain. MRI indicates R hip labral tear. Assessment significant for R glute, hip IR and ER weakness/instability with increased groin pain noted. Pt would benefit from PT to trial conservative therapy before consideration of surgery. Initiated gentle isometric exercises as tolerated today.   OBJECTIVE IMPAIRMENTS: decreased activity tolerance, decreased balance, decreased endurance, decreased mobility, decreased ROM, decreased strength, increased fascial restrictions, increased muscle spasms, impaired flexibility, improper body mechanics, postural dysfunction, and pain.   ACTIVITY LIMITATIONS: carrying, lifting, bending, sitting, squatting, stairs, transfers, bed mobility, bathing, toileting, hygiene/grooming, and locomotion level  PARTICIPATION LIMITATIONS: cleaning, laundry, driving, shopping, community activity, occupation, and yard work  PERSONAL FACTORS: Fitness, Past/current experiences, and Time since onset of injury/illness/exacerbation are also affecting patient's functional outcome.   REHAB POTENTIAL: Good  CLINICAL DECISION MAKING: Stable/uncomplicated  EVALUATION COMPLEXITY: Low   GOALS: Goals reviewed with patient? Yes  SHORT TERM GOALS: Target date: 07/27/2022  Pt will be ind with initial HEP Baseline: Goal status: INITIAL  2.  Pt will be able to perform bed transfers with </=2/10 pain Baseline:  Goal status: INITIAL    LONG TERM  GOALS: Target date: 08/25/2022   Pt will be ind with management and progression of HEP Baseline:  Goal status: INITIAL  2.  Pt will be able to ascend/descend stairs holding on to at least 50# to carry her dog with </=2/10 pain Baseline:  Goal status: INITIAL  3.  Pt will be able to tolerate sitting >1 hour for her work tasks Baseline:  Goal status: INITIAL  4.  Pt will have improved LEFS to >/=71.3% to demo MCID Baseline:  Goal status: INITIAL    PLAN:  PT FREQUENCY: 1x/week  PT DURATION: 8 weeks  PLANNED INTERVENTIONS: Therapeutic exercises, Therapeutic activity, Neuromuscular re-education, Balance training, Gait training, Patient/Family education, Self Care, Joint mobilization, Stair training, Aquatic Therapy, Dry Needling, Electrical stimulation, Taping, Vasopneumatic device, Ionotophoresis 4mg /ml Dexamethasone, Manual therapy, and Re-evaluation  PLAN FOR NEXT SESSION: Assess response to HEP. Continue isometric strengthening and progress as tolerated. Manual work if indicated on glute med  Lorayne Bender PT DPT 08/21/2022, 3:19 PM

## 2022-08-21 NOTE — Progress Notes (Signed)
Chief Complaint: Right foot pain     History of Present Illness:    Betty Leonard is a 42 y.o. female presents today for evaluation of pain in her right foot.  Patient states that 4 days ago, she sustained a fall when she slipped at home and fell onto her right side.  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  Her main concern today is the pain in her right foot, particularly over her second and third metatarsals where she has had prior surgery.  Pain levels today are moderate and she does note some swelling of the area.  Denies any numbness or tingling.  Has not been taking any pain medications or any other treatment modalities at this point.   Surgical History:   Right second and third metatarsal head resection  PMH/PSH/Family History/Social History/Meds/Allergies:    Past Medical History:  Diagnosis Date   Anemia    iron infusion sept 2020   Anxiety    Chicken pox    Depression    Esophagitis 12/24/2011   Exhaustion    Falling    GERD (gastroesophageal reflux disease)    History of wheezing 06/06/2018   Joint pain    Menorrhagia    with irregular cycles   Migraines    Multiple food allergies    Numbness    hands and feet   Pneumonia 07/2018   Recurrent acute suppurative otitis media of right ear without spontaneous rupture of tympanic membrane 08/05/2021   Seasonal allergies    Sinusitis 11/12/2021   Torn muscle    right hip   UTI (urinary tract infection) finished antibiotic 04-11-2019   Vertigo 10/05/2021   Vitamin D deficiency    Past Surgical History:  Procedure Laterality Date   ESOPHAGOGASTRODUODENOSCOPY  12/24/2011   Procedure: ESOPHAGOGASTRODUODENOSCOPY (EGD);  Surgeon: Louis Meckel, MD;  Location: [facility];  Service: Endoscopy;  Laterality: N/A;   FOOT SURGERY Right 2009   repair remotely after a fracture   FOOT SURGERY Right  1'2020   FOOT SURGERY Right 2023   HYSTEROSCOPY WITH NOVASURE N/A 04/25/2019   Procedure: HYSTEROSCOPY WITH NOVASURE ABLATION;  Surgeon: Jerene Bears, MD;  Location: [facility];  Service: Gynecology;  Laterality: N/A;   KNEE ARTHROSCOPY Left 2018   Social History   Socioeconomic History   Marital status: Single    Spouse name: Not on file   Number of children: Not on file   Years of education: Not on file   Highest education level: Not on file  Occupational History   Not on file  Tobacco Use   Smoking status: Former    Current packs/day: 0.00    Average packs/day: 1 pack/day for 5.0 years (5.0 ttl pk-yrs)    Types: Cigarettes    Start date: 12/28/2013    Quit date: 12/29/2018    Years since quitting: 3.6    Passive exposure: Past   Smokeless tobacco: Never   Tobacco comments:    stopped on 12/29/2018  Vaping Use   Vaping status: Some Days  Substance and Sexual Activity   Alcohol use: Yes    Comment: socially   Drug use: No   Sexual activity: Not Currently    Birth control/protection: Abstinence  Other Topics Concern   Not on file  Social History Narrative   Work or Holiday representative, [employer]      Home Situation: lives with roomate      Spiritual Beliefs: Christian      Lifestyle: CV exercise ( ) 3 days per week; diet is poor               Social Determinants of Corporate investment banker Strain: Not on file  Food Insecurity: No Food Insecurity (12/06/2019)   Hunger Vital Sign    Worried About Running Out of Food in the Last Year: Never true    Ran Out of Food in the Last Year: Never true  Transportation Needs: Not on file  Physical Activity: Not on file  Stress: Not on file  Social Connections: Unknown (05/18/2021)   Received from [facility]   Social Network    Social Network: Not on file   Family History  Problem Relation Age of Onset   Leukemia Paternal Grandfather    Lung cancer Paternal Grandfather    Arthritis  Paternal Grandfather    Diabetes Paternal Grandfather    Heart disease Paternal Grandfather    Hypertension Paternal Grandfather    Kidney disease Paternal Grandfather    Prostate cancer Paternal Grandfather    Gaucher's disease Paternal Grandfather    Sudden death Father        age 10, nothing found on autopsy   Diabetes Father    Hyperlipidemia Father    Hypertension Father    Mental illness Mother        bipolar, schizophrenia   Alcohol abuse Maternal Grandfather    Stroke Paternal Grandmother    Colon cancer Neg Hx    Allergies  Allergen Reactions   Codeine Shortness Of Breath   Hydrocodone Shortness Of Breath   Iodine Other (See Comments)    Shellfish allergy   Shellfish Allergy Anaphylaxis   Aspirin Hives   Current Outpatient Medications  Medication Sig Dispense Refill   EPINEPHrine 0.3 mg/0.3 mL IJ SOAJ injection Inject 0.3 mg into the muscle as needed for anaphylaxis.     Vitamin D, Ergocalciferol, (DRISDOL) 1.25 MG (50000 UNIT) CAPS capsule Take 1 capsule twice a week for vitamin D deficiency. 24 capsule 3   No current facility-administered medications for this visit.   No results found.  Review of Systems:   A ROS was performed including pertinent positives and negatives as documented in the HPI.  Physical Exam :   Constitutional: NAD and appears stated age Neurological: Alert and oriented Psych: Appropriate affect and cooperative There were no vitals taken for this visit.   Comprehensive Musculoskeletal Exam:    Prior surgical scars noted over second and third metatarsal heads.  Mild swelling noted over this area without erythema or ecchymosis.  Tenderness over distal second and third metatarsals as well as the medial arch of the foot.  Full active ankle range of motion.  DP pulse 2+.  Neurosensory exam intact.  Imaging:   Xray (right foot 3 views): No evidence of fracture or dislocation.  Absence of second and third metatarsal heads from prior surgery.   Surgical hardware of first metatarsal stable on and unchanged from prior x-ray.    I personally reviewed and interpreted the radiographs.   Assessment:   42 y.o. female with right foot pain after sustaining a fall 4 days ago.  X-rays taken today show no evidence of any acute bony abnormality.  I believe she likely sustained  a contusion as result of the fall.  Discussed if she continues to have pain in this area of the foot, I would recommend podiatry follow-up with Dr. Gala Lewandowsky as he performed her previous surgery and had been following her postop.  Otherwise I believe she can continue as planned with physical therapy and weightbearing as tolerated.  Will plan to follow-up as scheduled next week with Dr. Steward Drone.  Plan :    -Continue with physical therapy as scheduled -Return to clinic on 8/9 for scheduled follow-up with Dr. Steward Drone     I personally saw and evaluated the patient, and participated in the management and treatment plan.  Hazle Nordmann, PA-C Orthopedics  This document was dictated using Conservation officer, historic buildings. A reasonable attempt at proof reading has been made to minimize errors.

## 2022-08-24 ENCOUNTER — Encounter: Payer: 59 | Admitting: Internal Medicine

## 2022-08-24 ENCOUNTER — Ambulatory Visit (INDEPENDENT_AMBULATORY_CARE_PROVIDER_SITE_OTHER): Payer: 59 | Admitting: Family Medicine

## 2022-08-24 NOTE — Progress Notes (Signed)
Lab result shows a low positive ANA test. All of the measures for current inflammation are negative. This is a low positive result I doubt it indicates a real systemic disease. 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 We could see her back as needed if/when this problem flares up.

## 2022-08-26 ENCOUNTER — Ambulatory Visit (INDEPENDENT_AMBULATORY_CARE_PROVIDER_SITE_OTHER): Payer: 59 | Admitting: Orthopaedic Surgery

## 2022-08-26 DIAGNOSIS — M79671 Pain in right foot: Secondary | ICD-10-CM | POA: Diagnosis not present

## 2022-08-26 NOTE — Progress Notes (Signed)
Chief Complaint: Right foot pain        History of Present Illness:      08/26/2022: Presents today for follow-up of her right foot.  She is continue to experience pain about the second and third metatarsal.  She is also experiencing a pain although this has been very tolerable with therapy and following a trochanteric injection.  Betty Leonard is a 42 y.o. female presents today for evaluation of pain in her right foot.  Patient states that 4 days ago, she sustained a fall when she slipped at home and fell onto her right side.  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  Her main concern today is the pain in her right foot, particularly over her second and third metatarsals where she has had prior surgery.  Pain levels today are moderate and she does note some swelling of the area.  Denies any numbness or tingling.  Has not been taking any pain medications or any other treatment modalities at this point.     Surgical History:   Right second and third metatarsal head resection   PMH/PSH/Family History/Social History/Meds/Allergies:         Past Medical History:  Diagnosis Date   Anemia      iron infusion sept 2020   Anxiety     Chicken pox     Depression     Esophagitis 12/24/2011   Exhaustion     Falling     GERD (gastroesophageal reflux disease)     History of wheezing 06/06/2018   Joint pain     Menorrhagia      with irregular cycles   Migraines     Multiple food allergies     Numbness      hands and feet   Pneumonia 07/2018   Recurrent acute suppurative otitis media of right ear without spontaneous rupture of tympanic membrane 08/05/2021   Seasonal allergies     Sinusitis 11/12/2021   Torn muscle      right hip   UTI (urinary tract infection) finished antibiotic 04-11-2019   Vertigo 10/05/2021   Vitamin D deficiency               Past Surgical History:  Procedure  Laterality Date   ESOPHAGOGASTRODUODENOSCOPY   12/24/2011    Procedure: ESOPHAGOGASTRODUODENOSCOPY (EGD);  Surgeon: Louis Meckel, MD;  Location: [facility];  Service: Endoscopy;  Laterality: N/A;   FOOT SURGERY Right 2009    repair remotely after a fracture   FOOT SURGERY Right 1'2020   FOOT SURGERY Right 2023   HYSTEROSCOPY WITH NOVASURE N/A 04/25/2019    Procedure: HYSTEROSCOPY WITH NOVASURE ABLATION;  Surgeon: Jerene Bears, MD;  Location: [facility];  Service: Gynecology;  Laterality: N/A;   KNEE ARTHROSCOPY Left 2018        Social History         Socioeconomic History   Marital status: Single      Spouse name: Not on file   Number of children: Not on file   Years of education: Not on file   Highest education level: Not on file  Occupational History   Not on file  Tobacco Use   Smoking status: Former      Current packs/day: 0.00      Average packs/day: 1 pack/day for 5.0 years (5.0 ttl pk-yrs)      Types: Cigarettes  Start date: 12/28/2013      Quit date: 12/29/2018      Years since quitting: 3.6      Passive exposure: Past   Smokeless tobacco: Never   Tobacco comments:      stopped on 12/29/2018  Vaping Use   Vaping status: Some Days  Substance and Sexual Activity   Alcohol use: Yes      Comment: socially   Drug use: No   Sexual activity: Not Currently      Birth control/protection: Abstinence  Other Topics Concern   Not on file  Social History Narrative    Work or Holiday representative, [employer]         Home Situation: lives with roomate         Spiritual Beliefs: Christian         Lifestyle: CV exercise ( ) 3 days per week; diet is poor                        Social Determinants of Acupuncturist Strain: Not on file  Food Insecurity: No Food Insecurity (12/06/2019)    Hunger Vital Sign     Worried About Running Out of Food in the Last Year: Never true     Ran Out of Food in the Last Year: Never true   Transportation Needs: Not on file  Physical Activity: Not on file  Stress: Not on file  Social Connections: Unknown (05/18/2021)    Received from [employer]    Social Network     Social Network: Not on file         Family History  Problem Relation Age of Onset   Leukemia Paternal Grandfather     Lung cancer Paternal Grandfather     Arthritis Paternal Grandfather     Diabetes Paternal Grandfather     Heart disease Paternal Grandfather     Hypertension Paternal Grandfather     Kidney disease Paternal Grandfather     Prostate cancer Paternal Grandfather     Gaucher's disease Paternal Grandfather     Sudden death Father          age 63, nothing found on autopsy   Diabetes Father     Hyperlipidemia Father     Hypertension Father     Mental illness Mother          bipolar, schizophrenia   Alcohol abuse Maternal Grandfather     Stroke Paternal Grandmother     Colon cancer Neg Hx          Allergies       Allergies  Allergen Reactions   Codeine Shortness Of Breath   Hydrocodone Shortness Of Breath   Iodine Other (See Comments)      Shellfish allergy   Shellfish Allergy Anaphylaxis   Aspirin Hives            Current Outpatient Medications  Medication Sig Dispense Refill   EPINEPHrine 0.3 mg/0.3 mL IJ SOAJ injection Inject 0.3 mg into the muscle as needed for anaphylaxis.       Vitamin D, Ergocalciferol, (DRISDOL) 1.25 MG (50000 UNIT) CAPS capsule Take 1 capsule twice a week for vitamin D deficiency. 24 capsule 3      No current facility-administered medications for this visit.      Imaging Results (Last 48 hours)  No results found.     Review of Systems:   A ROS was performed including  pertinent positives and negatives as documented in the HPI.   Physical Exam :   Constitutional: NAD and appears stated age Neurological: Alert and oriented Psych: Appropriate affect and cooperative There were no vitals taken for this visit.    Comprehensive Musculoskeletal  Exam:     Prior surgical scars noted over second and third metatarsal heads.  Mild swelling noted over this area without erythema or ecchymosis.  Tenderness over distal second and third metatarsals as well as the medial arch of the foot.  Full active ankle range of motion.  DP pulse 2+.  Neurosensory exam intact.   Imaging:   Xray (right foot 3 views): No evidence of fracture or dislocation.  Absence of second and third metatarsal heads from prior surgery.  Surgical hardware of first metatarsal stable on and unchanged from prior x-ray.       I personally reviewed and interpreted the radiographs.     Assessment:   42 y.o. female with right foot pain in the setting of a previous second and third metatarsal resection with Dr. Logan Bores.  Today's visit this is the majority of her pain.  I described that at this point I am not quite sure if there would be any type of salvage procedure for this.  I would like to plan to refer her to my partner Dr. Lajoyce Corners to to see if she would be a candidate for some type of fusion.  We will plan for this.   Plan :     -Plan for referral to Dr. Lajoyce Corners to see if she would be a candidate for a salvage fusion of second and third metatarsal heads         I personally saw and evaluated the patient, and participated in the management and treatment plan.

## 2022-08-28 ENCOUNTER — Ambulatory Visit (HOSPITAL_BASED_OUTPATIENT_CLINIC_OR_DEPARTMENT_OTHER): Payer: 59 | Admitting: Orthopaedic Surgery

## 2022-09-07 ENCOUNTER — Ambulatory Visit (INDEPENDENT_AMBULATORY_CARE_PROVIDER_SITE_OTHER): Payer: 59 | Admitting: Orthopedic Surgery

## 2022-09-07 ENCOUNTER — Encounter (HOSPITAL_BASED_OUTPATIENT_CLINIC_OR_DEPARTMENT_OTHER): Payer: Self-pay | Admitting: Orthopaedic Surgery

## 2022-09-07 DIAGNOSIS — M79671 Pain in right foot: Secondary | ICD-10-CM | POA: Diagnosis not present

## 2022-09-08 ENCOUNTER — Encounter: Payer: Self-pay | Admitting: Orthopedic Surgery

## 2022-09-08 NOTE — Progress Notes (Signed)
Office Visit Note   Patient: Betty Leonard           Date of Birth: February 08, 1980           MRN: [id number] Visit Date: 09/07/2022              Requested by: Huel Cote, MD [address] [place],  [place] [address] PCP: Tollie Eth, NP  Chief Complaint  Patient presents with   Right Foot - Pain      HPI: Patient is a 42 year old woman who presents with pain dorsally over the second and third metatarsal heads.  Patient has had multiple surgical interventions for the right foot.  Patient states she initially had some fractures of the metatarsals and underwent Weil osteotomy for the second and third metatarsals in 2009 in [place].  Patient then followed up with Dr. Logan Bores and underwent surgery in 2017 and 2023.  She had metatarsal head resections of the second and third metatarsal and a plantarflexion osteotomy of the first metatarsal.  Patient states that her second and third toes are floppy without active extension.  Patient states she has tried carbon custom inserts without relief she has tried Voltaren gel without relief.  Assessment & Plan: Visit Diagnoses:  1. Pain in right foot     Plan: Patient's second and third metatarsal head pain is most likely due to scar tissue with impingement from weightbearing.  I do not feel like I have any good surgical options to treat this problem.  Feel she would still benefit from a stiff soled shoe to minimize dorsiflexion of the forefoot.  Follow-Up Instructions: No follow-ups on file.   Ortho Exam  Patient is alert, oriented, no adenopathy, well-dressed, normal affect, normal respiratory effort. Examination patient has good dorsiflexion of the ankle.  She has no pain to palpation across the midfoot.  Radiographs do not show any destructive bony changes or arthritis across the midfoot.  Patient has a good dorsalis pedis pulse.  Review of the radiographs shows metatarsal head resections of the second and third metatarsal and a  dorsal plate at the base of the first metatarsal which appears to be a lengthening plantarflexion osteotomy.  Patient has no active extension of the second and third toes and has pain to palpation primarily over the dorsum of the second and third toes.  Patient has pain with passive extension of the second and third toes consistent with impingement from scar tissue from the osteotomies.  Imaging: No results found. No images are attached to the encounter.  Labs: Lab Results  Component Value Date   HGBA1C 5.4 05/20/2022   HGBA1C 5.5 07/28/2012   ESRSEDRATE 9 08/20/2022   ESRSEDRATE 4 08/14/2020   CRP 7.0 08/20/2022   CRP 0.5 08/14/2020     Lab Results  Component Value Date   ALBUMIN 4.4 05/20/2022   ALBUMIN 4.0 08/14/2020   ALBUMIN 4.5 03/08/2020    No results found for: "MG" Lab Results  Component Value Date   VD25OH 16.2 (L) 07/29/2022   VD25OH 13.7 (L) 05/20/2022   VD25OH 13.1 (L) 05/27/2021    No results found for: "PREALBUMIN"    Latest Ref Rng & Units 05/20/2022    2:24 PM 05/27/2021    4:01 PM 08/14/2020   11:59 AM  CBC EXTENDED  WBC 3.4 - 10.8 x10E3/uL 9.2  11.8  6.9   RBC 3.77 - 5.28 x10E6/uL 5.21  5.17  4.98    4.94   Hemoglobin 11.1 -  15.9 g/dL 16.1  09.6  04.5   HCT 34.0 - 46.6 % 44.0  42.9  40.4   Platelets 150 - 450 x10E3/uL 390  397  322   NEUT# 1.4 - 7.0 x10E3/uL 5.9  6.9  3.9   Lymph# 0.7 - 3.1 x10E3/uL 2.5  3.8  2.1      There is no height or weight on file to calculate BMI.  Orders:  No orders of the defined types were placed in this encounter.  No orders of the defined types were placed in this encounter.    Procedures: No procedures performed  Clinical Data: No additional findings.  ROS:  All other systems negative, except as noted in the HPI. Review of Systems  Objective: Vital Signs: There were no vitals taken for this visit.  Specialty Comments:  No specialty comments available.  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 Arlyce Dice and GI 12/16/2011   Past Medical History:  Diagnosis Date   Anemia    iron infusion sept 2020   Anxiety    Chicken pox    Depression    Esophagitis 12/24/2011   Exhaustion    Falling    GERD (gastroesophageal reflux disease)    History of wheezing 06/06/2018   Joint pain    Menorrhagia    with irregular cycles   Migraines    Multiple food allergies    Numbness    hands and feet   Pneumonia 07/2018   Recurrent acute suppurative otitis media of right ear without spontaneous rupture of tympanic membrane 08/05/2021   Seasonal allergies    Sinusitis 11/12/2021   Torn muscle    right hip   UTI (urinary tract infection) finished antibiotic 04-11-2019   Vertigo 10/05/2021   Vitamin D deficiency     Family History  Problem Relation Age of Onset   Leukemia Paternal Grandfather    Lung cancer Paternal Grandfather    Arthritis Paternal Grandfather    Diabetes Paternal Grandfather    Heart  disease Paternal Grandfather    Hypertension Paternal Grandfather    Kidney disease Paternal Grandfather    Prostate cancer Paternal Grandfather    Gaucher's disease Paternal Grandfather    Sudden death Father        age 85, nothing found on autopsy   Diabetes Father    Hyperlipidemia Father    Hypertension Father    Mental illness Mother        bipolar, schizophrenia   Alcohol abuse  Maternal Grandfather    Stroke Paternal Grandmother    Colon cancer Neg Hx     Past Surgical History:  Procedure Laterality Date   ESOPHAGOGASTRODUODENOSCOPY  12/24/2011   Procedure: ESOPHAGOGASTRODUODENOSCOPY (EGD);  Surgeon: Louis Meckel, MD;  Location: [facility];  Service: Endoscopy;  Laterality: N/A;   FOOT SURGERY Right 2009   repair remotely after a fracture   FOOT SURGERY Right 1'2020   FOOT SURGERY Right 2023   HYSTEROSCOPY WITH NOVASURE N/A 04/25/2019   Procedure: HYSTEROSCOPY WITH NOVASURE ABLATION;  Surgeon: Jerene Bears, MD;  Location: [facility];  Service: Gynecology;  Laterality: N/A;   KNEE ARTHROSCOPY Left 2018   Social History   Occupational History   Not on file  Tobacco Use   Smoking status: Former    Current packs/day: 0.00    Average packs/day: 1 pack/day for 5.0 years (5.0 ttl pk-yrs)    Types: Cigarettes    Start date: 12/28/2013    Quit date: 12/29/2018    Years since quitting: 3.6    Passive exposure: Past   Smokeless tobacco: Never   Tobacco comments:    stopped on 12/29/2018  Vaping Use   Vaping status: Some Days  Substance and Sexual Activity   Alcohol use: Yes    Comment: socially   Drug use: No   Sexual activity: Not Currently    Birth control/protection: Abstinence

## 2022-11-02 ENCOUNTER — Ambulatory Visit (HOSPITAL_BASED_OUTPATIENT_CLINIC_OR_DEPARTMENT_OTHER): Payer: 59 | Admitting: Certified Nurse Midwife

## 2023-03-01 ENCOUNTER — Telehealth (HOSPITAL_BASED_OUTPATIENT_CLINIC_OR_DEPARTMENT_OTHER): Payer: Self-pay | Admitting: *Deleted

## 2023-03-01 NOTE — Telephone Encounter (Signed)
 Pt called with complaints of hot flashes, migraines, and fatigue for the last couple of months. She is unsure if this is related to perimenopause or anemia. Pt provided with appt for evaluation and possible labs.

## 2023-03-08 ENCOUNTER — Encounter: Payer: Self-pay | Admitting: Family

## 2023-03-08 ENCOUNTER — Other Ambulatory Visit: Payer: Self-pay

## 2023-03-08 ENCOUNTER — Ambulatory Visit (HOSPITAL_BASED_OUTPATIENT_CLINIC_OR_DEPARTMENT_OTHER): Payer: 59 | Admitting: Certified Nurse Midwife

## 2023-03-08 ENCOUNTER — Other Ambulatory Visit (HOSPITAL_BASED_OUTPATIENT_CLINIC_OR_DEPARTMENT_OTHER): Payer: Self-pay | Admitting: Certified Nurse Midwife

## 2023-03-08 ENCOUNTER — Encounter (HOSPITAL_BASED_OUTPATIENT_CLINIC_OR_DEPARTMENT_OTHER): Payer: Self-pay | Admitting: Certified Nurse Midwife

## 2023-03-08 ENCOUNTER — Other Ambulatory Visit (HOSPITAL_BASED_OUTPATIENT_CLINIC_OR_DEPARTMENT_OTHER): Payer: Self-pay

## 2023-03-08 VITALS — BP 110/74 | HR 95 | Ht 66.0 in | Wt 226.2 lb

## 2023-03-08 DIAGNOSIS — N951 Menopausal and female climacteric states: Secondary | ICD-10-CM | POA: Diagnosis not present

## 2023-03-08 DIAGNOSIS — R5383 Other fatigue: Secondary | ICD-10-CM

## 2023-03-08 DIAGNOSIS — R232 Flushing: Secondary | ICD-10-CM | POA: Diagnosis not present

## 2023-03-08 DIAGNOSIS — R61 Generalized hyperhidrosis: Secondary | ICD-10-CM | POA: Diagnosis not present

## 2023-03-08 MED ORDER — PROGESTERONE 200 MG PO CAPS
200.0000 mg | ORAL_CAPSULE | Freq: Every day | ORAL | 3 refills | Status: DC
  Filled 2023-03-08: qty 30, 30d supply, fill #0

## 2023-03-08 MED ORDER — EPINEPHRINE 0.3 MG/0.3ML IJ SOAJ
0.3000 mg | INTRAMUSCULAR | 1 refills | Status: AC | PRN
  Filled 2023-03-08 (×2): qty 2, 30d supply, fill #0

## 2023-03-08 MED ORDER — ESTRADIOL 0.075 MG/24HR TD PTTW
1.0000 | MEDICATED_PATCH | TRANSDERMAL | 12 refills | Status: DC
  Filled 2023-03-08: qty 8, 28d supply, fill #0

## 2023-03-08 MED ORDER — EPIPEN 2-PAK 0.3 MG/0.3ML IJ SOAJ
0.3000 mg | INTRAMUSCULAR | 1 refills | Status: DC | PRN
  Filled 2023-03-08: qty 2, 30d supply, fill #0

## 2023-03-08 NOTE — Progress Notes (Signed)
 GYNECOLOGY  VISIT  CC:   hot flashes, night sweats, fatigue  HPI: 43 y.o. G0P0000 Single White or Caucasian female here for problem gyn visit. Pt states that approx 2 months ago she started to experience hot flashes and night sweats. Night sweats are happening every night and making it difficult for her to sleep. She feels fatigued. Patient reports a hx anemia. Hx uterine ablation (not sexually active at this time). Hx Low Vitamin D despite taking Vitamin D 50,000iu twice weekly. She does not eat red meat.   Works full time in Audiological scientist. Has a 43yo female ShihTzu at home. .   Past Medical History:  Diagnosis Date   Anemia    iron infusion sept 2020   Anxiety    Chicken pox    Depression    Esophagitis 12/24/2011   Exhaustion    Falling    GERD (gastroesophageal reflux disease)    History of wheezing 06/06/2018   Joint pain    Menorrhagia    with irregular cycles   Migraines    Multiple food allergies    Numbness    hands and feet   Pneumonia 07/2018   Recurrent acute suppurative otitis media of right ear without spontaneous rupture of tympanic membrane 08/05/2021   Seasonal allergies    Sinusitis 11/12/2021   Torn muscle    right hip   UTI (urinary tract infection) finished antibiotic 04-11-2019   Vertigo 10/05/2021   Vitamin D deficiency     MEDS:   Current Outpatient Medications on File Prior to Visit  Medication Sig Dispense Refill   EPINEPHrine 0.3 mg/0.3 mL IJ SOAJ injection Inject 0.3 mg into the muscle as needed for anaphylaxis.     Vitamin D, Ergocalciferol, (DRISDOL) 1.25 MG (50000 UNIT) CAPS capsule Take 1 capsule twice a week for vitamin D deficiency. 24 capsule 3   No current facility-administered medications on file prior to visit.    ALLERGIES: Codeine, Hydrocodone, Iodine, Shellfish allergy, and Aspirin  SH:  works full time hybrid, some in office, some at home  Review of Systems  Constitutional:        Hot flashes, night sweats, fatigue,  difficulty sleeping  HENT: Negative.    Genitourinary: Negative.   Musculoskeletal: Negative.   Skin: Negative.   Psychiatric/Behavioral:  Negative for depression, hallucinations, memory loss, substance abuse and suicidal ideas. The patient has insomnia. The patient is not nervous/anxious.     PHYSICAL EXAMINATION:    BP 110/74   Pulse 95   Ht 5\' 6"  (1.676 m)   Wt 226 lb 3.2 oz (102.6 kg)   BMI 36.51 kg/m     General appearance: alert, cooperative and appears stated age Assessment/Plan:  1. Perimenopausal vasomotor symptoms - Hot flashes/night sweats/fatigue - No contraindications noted for HT. Pt will start Vivelle-Dot estradiol patch twice weekly - Prog 200mg  po qhs  2. Other fatigue (Primary) - Hx anemia, Hx Low Vit D - Thyroid Panel With TSH - VITAMIN D 25 Hydroxy (Vit-D Deficiency, Fractures) - CBC - Iron, TIBC and Ferritin Panel - Comp Met (CMET) - Progesterone  3. Hot flashes  4. Night sweats  Pt will send message via MyChart in 1-2 weeks with update on how she is feeling. Pt to let us know if she feels that the estradiol patch is relieving her hot flashes and night sweats. She is aware of increased risk for htn, DVT or other blood clot, increased risk breast cancer, etc. She will monitor. Letta Kocher

## 2023-03-09 ENCOUNTER — Encounter (HOSPITAL_BASED_OUTPATIENT_CLINIC_OR_DEPARTMENT_OTHER): Payer: Self-pay | Admitting: Certified Nurse Midwife

## 2023-03-09 ENCOUNTER — Other Ambulatory Visit (HOSPITAL_BASED_OUTPATIENT_CLINIC_OR_DEPARTMENT_OTHER): Payer: Self-pay

## 2023-03-09 ENCOUNTER — Other Ambulatory Visit (HOSPITAL_BASED_OUTPATIENT_CLINIC_OR_DEPARTMENT_OTHER): Payer: Self-pay | Admitting: Certified Nurse Midwife

## 2023-03-09 DIAGNOSIS — R7989 Other specified abnormal findings of blood chemistry: Secondary | ICD-10-CM | POA: Insufficient documentation

## 2023-03-09 LAB — IRON,TIBC AND FERRITIN PANEL
Ferritin: 327 ng/mL — ABNORMAL HIGH (ref 15–150)
Iron Saturation: 38 % (ref 15–55)
Iron: 98 ug/dL (ref 27–159)
Total Iron Binding Capacity: 261 ug/dL (ref 250–450)
UIBC: 163 ug/dL (ref 131–425)

## 2023-03-09 LAB — COMPREHENSIVE METABOLIC PANEL
ALT: 10 [IU]/L (ref 0–32)
AST: 11 [IU]/L (ref 0–40)
Albumin: 4.4 g/dL (ref 3.9–4.9)
Alkaline Phosphatase: 83 [IU]/L (ref 44–121)
BUN/Creatinine Ratio: 16 (ref 9–23)
BUN: 12 mg/dL (ref 6–24)
Bilirubin Total: 0.7 mg/dL (ref 0.0–1.2)
CO2: 22 mmol/L (ref 20–29)
Calcium: 9.7 mg/dL (ref 8.7–10.2)
Chloride: 101 mmol/L (ref 96–106)
Creatinine, Ser: 0.74 mg/dL (ref 0.57–1.00)
Globulin, Total: 2.7 g/dL (ref 1.5–4.5)
Glucose: 89 mg/dL (ref 70–99)
Potassium: 4.5 mmol/L (ref 3.5–5.2)
Sodium: 139 mmol/L (ref 134–144)
Total Protein: 7.1 g/dL (ref 6.0–8.5)
eGFR: 104 mL/min/{1.73_m2} (ref >59)

## 2023-03-09 LAB — THYROID PANEL WITH TSH
Free Thyroxine Index: 2.5 (ref 1.2–4.9)
T3 Uptake Ratio: 28 % (ref 24–39)
T4, Total: 9 ug/dL (ref 4.5–12.0)
TSH: 0.851 u[IU]/mL (ref 0.450–4.500)

## 2023-03-09 LAB — CBC
Hematocrit: 44.7 % (ref 34.0–46.6)
Hemoglobin: 14.5 g/dL (ref 11.1–15.9)
MCH: 26.8 pg (ref 26.6–33.0)
MCHC: 32.4 g/dL (ref 31.5–35.7)
MCV: 83 fL (ref 79–97)
Platelets: 409 10*3/uL (ref 150–450)
RBC: 5.42 x10E6/uL — ABNORMAL HIGH (ref 3.77–5.28)
RDW: 12.9 % (ref 11.7–15.4)
WBC: 9.4 10*3/uL (ref 3.4–10.8)

## 2023-03-09 LAB — VITAMIN D 25 HYDROXY (VIT D DEFICIENCY, FRACTURES): Vit D, 25-Hydroxy: 12 ng/mL — ABNORMAL LOW (ref 30.0–100.0)

## 2023-03-09 LAB — PROGESTERONE: Progesterone: 0.1 ng/mL

## 2023-03-09 MED ORDER — CHOLECALCIFEROL 1.25 MG (50000 UT) PO CAPS
50000.0000 [IU] | ORAL_CAPSULE | ORAL | 4 refills | Status: AC
  Filled 2023-03-09: qty 12, 84d supply, fill #0

## 2023-03-10 ENCOUNTER — Encounter (HOSPITAL_BASED_OUTPATIENT_CLINIC_OR_DEPARTMENT_OTHER): Payer: Self-pay | Admitting: Obstetrics & Gynecology

## 2023-03-17 ENCOUNTER — Encounter (HOSPITAL_BASED_OUTPATIENT_CLINIC_OR_DEPARTMENT_OTHER): Payer: Self-pay | Admitting: Obstetrics & Gynecology

## 2023-03-17 ENCOUNTER — Other Ambulatory Visit: Payer: Self-pay | Admitting: Nurse Practitioner

## 2023-03-17 ENCOUNTER — Telehealth (HOSPITAL_BASED_OUTPATIENT_CLINIC_OR_DEPARTMENT_OTHER): Payer: 59 | Admitting: Obstetrics & Gynecology

## 2023-03-17 DIAGNOSIS — R232 Flushing: Secondary | ICD-10-CM | POA: Diagnosis not present

## 2023-03-17 DIAGNOSIS — Z862 Personal history of diseases of the blood and blood-forming organs and certain disorders involving the immune mechanism: Secondary | ICD-10-CM

## 2023-03-17 DIAGNOSIS — R198 Other specified symptoms and signs involving the digestive system and abdomen: Secondary | ICD-10-CM

## 2023-03-17 DIAGNOSIS — R7989 Other specified abnormal findings of blood chemistry: Secondary | ICD-10-CM | POA: Diagnosis not present

## 2023-03-17 DIAGNOSIS — E559 Vitamin D deficiency, unspecified: Secondary | ICD-10-CM | POA: Diagnosis not present

## 2023-03-17 NOTE — Progress Notes (Signed)
 Virtual Visit via Video Note  I connected with Betty Leonard on 03/17/23 at  8:15 AM EST by a video enabled telemedicine application and verified that I am speaking with the correct person using two identifiers.  Location: Patient: home Provider: office   I discussed the limitations of evaluation and management by telemedicine and the availability of in person appointments. The patient expressed understanding and agreed to proceed.  History of Present Illness: 43 yo G0 SWF with complaints of fatigue and symptoms c/w vasomotor symptoms for discussion of lab work obtained last week when she saw Merrilee Jansky, CNM.  I have seen pt for many years and she desired a second opinion about treatment options.  Lab work showed lower Vit D which has been present for many years, continued mildly elevated ferritin (which started after iron infusions in 2020/2021 and was followed by oncology at that time), and low progesterone.  She was prescribed estradiol patch and progesterone.  Was advised she could have bleeding.  Does not want to start HRT if this is the case.  Also, really is wondering about estradiol level.  Offered testing with FSH.  She was prescribed cholecalciferol vs erogcalciferol.  Has questions about this.  Answered and recommend she try cholecalciferol for 3 months and then have Vit D repeated.  She noticed her TSH was lower and has questions.  Answered these as well.  Advised this is normal and discussed relationship between TSH and thyroid function.  Due to continued low vit d discussed possible malabsorption issues.  She avoids beef due to intolerance.  Has never been tested for alpha gal or celiac.  No colonoscopy.  Advised I've never tested for alpha gal so would like to reach out to her PCP.  Will also reach out to oncology.   Observations/Objective: WNWD WF, NAD  Assessment and Plan: 1. Hot flashes (Primary) - she will return for additional blood work before starting HRT - Estradiol;  Future - Follicle stimulating hormone; Future  2. Vitamin D deficiency - she is going to try cholecalciferol for 3 months and repeat vit d - have reached out to PCP about possible malabsorption testing  3. History of iron deficiency anemia - she is not taking any iron  4. Elevated ferritin - will check with oncology about possible additional testing as well   Follow Up Instructions: I discussed the assessment and treatment plan with the patient. The patient was provided an opportunity to ask questions and all were answered. The patient agreed with the plan and demonstrated an understanding of the instructions.   The patient was advised to call back or seek an in-person evaluation if the symptoms worsen or if the condition fails to improve as anticipated.  I provided 39 minutes of non-face-to-face time during this encounter.   Jerene Bears, MD

## 2023-03-18 ENCOUNTER — Other Ambulatory Visit (HOSPITAL_BASED_OUTPATIENT_CLINIC_OR_DEPARTMENT_OTHER): Payer: Self-pay | Admitting: Obstetrics & Gynecology

## 2023-03-18 ENCOUNTER — Encounter (HOSPITAL_BASED_OUTPATIENT_CLINIC_OR_DEPARTMENT_OTHER): Payer: Self-pay | Admitting: Obstetrics & Gynecology

## 2023-03-18 ENCOUNTER — Other Ambulatory Visit (HOSPITAL_BASED_OUTPATIENT_CLINIC_OR_DEPARTMENT_OTHER): Payer: 59

## 2023-03-18 DIAGNOSIS — R198 Other specified symptoms and signs involving the digestive system and abdomen: Secondary | ICD-10-CM

## 2023-03-18 DIAGNOSIS — R232 Flushing: Secondary | ICD-10-CM

## 2023-03-18 DIAGNOSIS — R7989 Other specified abnormal findings of blood chemistry: Secondary | ICD-10-CM

## 2023-03-23 LAB — FOLLICLE STIMULATING HORMONE: FSH: 3.5 m[IU]/mL

## 2023-03-23 LAB — ESTRADIOL: Estradiol: 126 pg/mL

## 2023-03-23 LAB — HEMOCHROMATOSIS DNA-PCR(C282Y,H63D)

## 2023-03-23 LAB — ALPHA-GAL PANEL
Allergen Lamb IgE: <0.1 kU/L
Beef IgE: <0.1 kU/L
IgE (Immunoglobulin E), Serum: 56 [IU]/mL (ref 6–495)
O215-IgE Alpha-Gal: <0.1 kU/L
Pork IgE: <0.1 kU/L

## 2023-03-23 LAB — GLIA (IGA/G) + TTG IGA
Antigliadin Abs, IgA: 6 U (ref 0–19)
Gliadin IgG: 2 U (ref 0–19)
Transglutaminase IgA: <2 U/mL (ref 0–3)

## 2023-03-31 ENCOUNTER — Encounter: Payer: Self-pay | Admitting: Nurse Practitioner

## 2023-03-31 DIAGNOSIS — R899 Unspecified abnormal finding in specimens from other organs, systems and tissues: Secondary | ICD-10-CM

## 2023-03-31 DIAGNOSIS — R768 Other specified abnormal immunological findings in serum: Secondary | ICD-10-CM

## 2023-04-08 ENCOUNTER — Ambulatory Visit: Admitting: Allergy & Immunology

## 2023-04-08 ENCOUNTER — Encounter: Payer: Self-pay | Admitting: Allergy & Immunology

## 2023-04-08 ENCOUNTER — Other Ambulatory Visit: Payer: Self-pay

## 2023-04-08 ENCOUNTER — Encounter: Payer: Self-pay | Admitting: Family

## 2023-04-08 VITALS — BP 100/70 | HR 101 | Temp 98.2°F | Ht 66.14 in | Wt 229.8 lb

## 2023-04-08 DIAGNOSIS — R52 Pain, unspecified: Secondary | ICD-10-CM | POA: Diagnosis not present

## 2023-04-08 DIAGNOSIS — L508 Other urticaria: Secondary | ICD-10-CM

## 2023-04-08 DIAGNOSIS — T7803XD Anaphylactic reaction due to other fish, subsequent encounter: Secondary | ICD-10-CM | POA: Diagnosis not present

## 2023-04-08 DIAGNOSIS — D500 Iron deficiency anemia secondary to blood loss (chronic): Secondary | ICD-10-CM

## 2023-04-08 DIAGNOSIS — E559 Vitamin D deficiency, unspecified: Secondary | ICD-10-CM

## 2023-04-08 DIAGNOSIS — J310 Chronic rhinitis: Secondary | ICD-10-CM

## 2023-04-08 DIAGNOSIS — B999 Unspecified infectious disease: Secondary | ICD-10-CM

## 2023-04-08 NOTE — Patient Instructions (Addendum)
 1. Chronic urticaria - I am not sure what is going on with you. - We are going to get some inflammatory markers to see where this is trending. - We are going to get a serum tryptase to check for a mast cell disorder. - We are going to get a chronic urticarial panel to look for autoimmune urticaria.   2. Seafood allergy, anaphylaxis - We will check for a seafood allergy.  3. Chronic rhinitis - We will get environmental allergy testing via the blood.  - Continue with Xyzal for now.   4. Recurrent infections -- We will obtain some screening labs to evaluate your immune system.  - Labs to evaluate the quantitative [facility]) aspects of your immune system: IgG/IgA/IgM, CBC with differential - Labs to evaluate the qualitative (HOW WELL THEY WORK) aspects of your immune system: CH50, Pneumococcal titers, Tetanus titers, Diphtheria titers - We may consider immunizations with Pneumovax and Tdap to challenge your immune system, and then obtain repeat titers in 4-6 weeks.  - We are going to get genetic testing done as well, given your rather complicated history.   5. Return in about 6 weeks (around 05/20/2023). You can have the follow up appointment with Dr. Dellis Anes or a Nurse Practicioner (our Nurse Practitioners are excellent and always have Physician oversight!).    Please inform us of any Emergency Department visits, hospitalizations, or changes in symptoms. Call us before going to the ED for breathing or allergy symptoms since we might be able to fit you in for a sick visit. Feel free to contact us anytime with any questions, problems, or concerns.  It was a pleasure to meet you today! You're a hoot!   Websites that have reliable patient information: 1. American Academy of Asthma, Allergy, and Immunology: www.aaaai.org 2. Food Allergy Research and Education (FARE): foodallergy.org 3. Mothers of Asthmatics: http://www.asthmacommunitynetwork.org 4. American College of Allergy, Asthma, and  Immunology: www.acaai.org      "Like" [place] on Facebook and Instagram for our latest updates!      A healthy democracy works best when [employer] participate! Make sure you are registered to vote! If you have moved or changed any of your contact information, you will need to get this updated before voting! Scan the QR codes below to learn more!

## 2023-04-08 NOTE — Progress Notes (Signed)
 NEW PATIENT  Date of Service/Encounter:  04/08/23  Consult requested by: Tollie Eth, NP   Assessment:   Chronic urticaria   Seafood allergy  Chronic rhinitis  Generalized pain  Recurrent infections   Iron deficiency anemia  Vitamin D deficiency  Plan/Recommendations:   1. Chronic urticaria - I am not sure what is going on with you. - We are going to get some inflammatory markers to see where this is trending. - We are going to get a serum tryptase to check for a mast cell disorder. - We are going to get a chronic urticarial panel to look for autoimmune urticaria.   2. Seafood allergy, anaphylaxis - We will check for a seafood allergy.  3. Chronic rhinitis - We will get environmental allergy testing via the blood.  - Continue with Xyzal for now.   4. Recurrent infections -- We will obtain some screening labs to evaluate your immune system.  - Labs to evaluate the quantitative [facility]) aspects of your immune system: IgG/IgA/IgM, CBC with differential - Labs to evaluate the qualitative (HOW WELL THEY WORK) aspects of your immune system: CH50, Pneumococcal titers, Tetanus titers, Diphtheria titers - We may consider immunizations with Pneumovax and Tdap to challenge your immune system, and then obtain repeat titers in 4-6 weeks.  - We are going to get genetic testing done as well, given your rather complicated history.   5. Return in about 6 weeks (around 05/20/2023). You can have the follow up appointment with Dr. Dellis Anes or a Nurse Practicioner (our Nurse Practitioners are excellent and always have Physician oversight!).    This note in its entirety was forwarded to the Provider who requested this consultation.  Subjective:   Betty Leonard is a 43 y.o. female presenting today for evaluation of  Chief Complaint  Patient presents with   Allergy Testing   Headache   Urticaria   Pruritus    Betty Leonard has a history of the following: Patient Active  Problem List   Diagnosis Date Noted   Low vitamin D level 03/09/2023   Perimenopausal vasomotor symptoms 03/08/2023   Hot flashes 03/08/2023   Night sweats 03/08/2023   Chronic right hip pain 08/20/2022   Hand numbness 08/20/2022   Polyarthralgia 08/20/2022   Abnormal laboratory test result 08/20/2022   Vitamin D deficiency 07/29/2022   SOBOE (shortness of breath on exertion) 07/29/2022   Other fatigue 07/29/2022   Other hyperlipidemia 07/29/2022   BMI 37.0-37.9, adult 07/29/2022   Generalized obesity with starting BMI 37.5 07/29/2022   Depression screen 07/29/2022   Chronic foot pain, right 07/02/2022   Eating disorder 07/02/2022   Injury of left hand 02/02/2022   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 Arlyce Dice and GI 12/16/2011    History obtained from: chart review and patient.  Discussed the use of AI scribe software for clinical note transcription with the patient and/or guardian, who gave verbal consent to proceed.  Betty Leonard was referred by Early, Sung Amabile, NP.     Betty Leonard is a 43 y.o. female  presenting for an evaluation of multiple complaints .  [name] reports that she experiences chronic urticaria, with hives occurring daily for the past five to six years without a clear trigger. The hives are intensely pruritic and unpredictable, often appearing in public places like stores. She has attempted various environmental modifications at home, such as using fragrance-free products and air purifiers, but these have not provided relief.  She describes  generalized fatigue and myalgia, likening it to having the flu every day. The pain is significant, particularly at night, and she manages it with a combination of Advil and Tylenol daily. She has a history of a hip muscle tear from minimal activity, contributing to her discomfort.  She has recurrent otitis media, which she can self-diagnose and treat with antibiotics. Despite the frequency of these infections, she has not consulted an otolaryngologist due to scheduling conflicts.  She has a history of iron deficiency anemia, previously managed with iron infusions. Despite normalization of her iron levels after the last infusion, she continues to experience symptoms associated with anemia.  Her vitamin D levels remain low despite supplementation with 50,000 units twice daily, only increasing slightly from 11 to 16.  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 She has a family history of Gaucher's disease, with her grandfather having been treated for it.  She reports significant alopecia, particularly on the top of her head. She previously received steroid injections to promote hair growth but discontinued due to concerns about steroid use.  She has a known allergy to shellfish, which causes throat swelling, and she avoids beef due to gastrointestinal discomfort.  She has undergone extensive testing, including for Lyme disease and spotted fever, as part of a broader investigation into her symptoms. She has also been evaluated by a rheumatologist, but no definitive diagnosis was made. Her ANA is slightly elevated, but other inflammatory markers like CRP are normal. She manages her allergies with Xyzal, which she takes frequently, and has switched medications periodically to maintain efficacy. She reports normal stools and no significant gastrointestinal issues aside from specific food intolerances.     Otherwise, there is no history  of other atopic diseases, including drug allergies, stinging insect allergies, or contact dermatitis. There is no significant infectious history. Vaccinations are up to date.    Past Medical History: Patient Active Problem List   Diagnosis Date Noted   Low vitamin D level 03/09/2023   Perimenopausal vasomotor symptoms 03/08/2023   Hot flashes 03/08/2023   Night sweats 03/08/2023   Chronic right hip pain 08/20/2022   Hand numbness 08/20/2022   Polyarthralgia 08/20/2022   Abnormal laboratory test result 08/20/2022   Vitamin D deficiency 07/29/2022   SOBOE (shortness of breath on exertion) 07/29/2022   Other fatigue 07/29/2022   Other hyperlipidemia 07/29/2022   BMI 37.0-37.9, adult 07/29/2022   Generalized obesity with starting BMI 37.5 07/29/2022   Depression screen 07/29/2022   Chronic foot pain, right 07/02/2022   Eating disorder 07/02/2022   Injury of left hand 02/02/2022   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 Arlyce Dice and GI 12/16/2011    Medication List:  Allergies as of 04/08/2023       Reactions   Codeine Shortness Of Breath   Hydrocodone Shortness Of Breath   Iodine Other (See Comments)   Shellfish allergy   Shellfish Allergy Anaphylaxis   Aspirin Hives        Medication List        Accurate as of April 08, 2023 12:06 PM. If you have any questions, ask your nurse or doctor.          Dotti 0.075 MG/24HR Generic drug: estradiol Place 1 patch onto the skin 2 (two) times a week.   EPINEPHrine 0.3 mg/0.3 mL Soaj injection Commonly  known as: EPI-PEN Inject 0.3 mg into the muscle as needed for anaphylaxis.   EPINEPHrine 0.3 mg/0.3 mL Soaj injection Commonly known as: EpiPen 2-Pak Inject 0.3 mg into the muscle as needed for anaphylaxis.   levocetirizine 5 MG tablet Commonly known as: XYZAL Take 5 mg by mouth every evening.   progesterone 200 MG capsule Commonly known as: PROMETRIUM Take 1 capsule (200 mg total) by mouth at bedtime.   Vitamin D (Ergocalciferol) 1.25 MG (50000 UNIT) Caps capsule Commonly known as: DRISDOL Take 1 capsule twice a week for vitamin D deficiency.   Vitamin D3 1.25 MG (50000 UT) Caps Take 1 capsule (50,000 Units total) by mouth once a week.        Birth History: non-contributory  Developmental History: non-contributory  Past Surgical History: Past Surgical History:  Procedure Laterality Date   ESOPHAGOGASTRODUODENOSCOPY  12/24/2011   Procedure: ESOPHAGOGASTRODUODENOSCOPY (EGD);  Surgeon: Louis Meckel, MD;  Location: [facility];  Service: Endoscopy;  Laterality: N/A;   FOOT SURGERY Right 2009   repair remotely after a fracture   FOOT SURGERY Right 1'2020   FOOT SURGERY Right 2023   HYSTEROSCOPY WITH NOVASURE N/A 04/25/2019   Procedure: HYSTEROSCOPY WITH NOVASURE ABLATION;  Surgeon: Jerene Bears, MD;  Location[facility];  Service: Gynecology;  Laterality: N/A;   KNEE ARTHROSCOPY Left 2018     Family History: Family History  Problem Relation Age of Onset   Leukemia Paternal Grandfather    Lung cancer Paternal Grandfather    Arthritis Paternal Grandfather    Diabetes Paternal Grandfather    Heart disease Paternal Grandfather    Hypertension Paternal Grandfather    Kidney disease Paternal Grandfather    Prostate cancer Paternal Grandfather    Gaucher's disease Paternal Grandfather    Sudden death Father        age 49, nothing found on autopsy   Diabetes Father    Hyperlipidemia Father    Hypertension Father    Mental illness Mother         bipolar, schizophrenia   Alcohol abuse Maternal Grandfather    Stroke Paternal Grandmother    Colon cancer Neg Hx      Social History: Sonita lives at home with her family.  She lives in an apartment.  There is carpeting throughout the home.  There is no mildew damage.  She has electric heating and central cooling.  There is 1 dog inside of the home.  There are no dust mite covers on the bedding.  She does vape in the car.  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  There are no fume, chemical, or dust exposures.  She does have a HEPA filter in her home.  [place]   Review of systems otherwise negative other than that mentioned in the HPI.    Objective:   Blood pressure 100/70, pulse (!) 101, temperature 98.2 F (36.8 C), height 5' 6.14" (1.68 m), weight 229 lb 12.8 oz (104.2 kg), SpO2 95%. Body mass index is 36.93 kg/m.     Physical Exam Vitals reviewed.  Constitutional:      Appearance: She is well-developed.  HENT:     Head: Normocephalic and atraumatic.     Right Ear: Tympanic membrane, ear canal and external ear normal. No drainage, swelling or tenderness. Tympanic membrane is not injected, scarred, erythematous, retracted or bulging.     Left Ear: Tympanic membrane, ear canal and external ear normal. No drainage, swelling or tenderness. Tympanic membrane is not injected, scarred, erythematous, retracted or bulging.     Nose: No nasal deformity, septal deviation, mucosal edema or rhinorrhea.     Right Turbinates: Enlarged, swollen and pale.     Left Turbinates: Enlarged, swollen and pale.     Right Sinus: No maxillary sinus tenderness or frontal sinus tenderness.     Left Sinus: No maxillary sinus tenderness or frontal sinus tenderness.     Mouth/Throat:     Mouth: Mucous membranes are not pale and not dry.     Pharynx: Uvula midline.  Eyes:     General:        Right eye: No discharge.        Left eye: No discharge.      Conjunctiva/sclera: Conjunctivae normal.     Right eye: Right conjunctiva is not injected. No chemosis.    Left eye: Left conjunctiva is not injected. No chemosis.    Pupils: Pupils are equal, round, and reactive to light.  Cardiovascular:     Rate and Rhythm: Normal rate and regular rhythm.     Heart sounds: Normal heart sounds.  Pulmonary:     Effort: Pulmonary effort is normal. No tachypnea, accessory muscle usage or respiratory distress.     Breath sounds: Normal breath sounds. No wheezing, rhonchi or rales.     Comments: No crackles or wheezes noted.  Chest:     Chest wall: No tenderness.  Abdominal:     Tenderness: There is no abdominal tenderness. There is no guarding or rebound.  Lymphadenopathy:     Head:     Right side of head: No submandibular, tonsillar or occipital adenopathy.     Left side of head: No submandibular, tonsillar or occipital adenopathy.     Cervical: No cervical adenopathy.  Skin:    Coloration: Skin is not pale.     Findings: No abrasion, erythema, petechiae or rash. Rash is not papular, urticarial or vesicular.  Neurological:     Mental Status: She is alert.  Psychiatric:        Behavior: Behavior is cooperative.      Diagnostic studies: labs sent instead          Malachi Bonds, MD [facility]

## 2023-04-13 ENCOUNTER — Encounter: Payer: Self-pay | Admitting: Allergy & Immunology

## 2023-04-16 LAB — ALLERGENS W/COMP RFLX AREA 2: IgE (Immunoglobulin E), Serum: 67 [IU]/mL (ref 6–495)

## 2023-04-16 LAB — ALLERGY PANEL 19, SEAFOOD GROUP

## 2023-04-16 LAB — SEDIMENTATION RATE: Sed Rate: 43 mm/h — ABNORMAL HIGH (ref 0–32)

## 2023-04-16 LAB — CBC WITH DIFF/PLATELET
Basophils Absolute: 0.1 10*3/uL (ref 0.0–0.2)
Basos: 1 %
EOS (ABSOLUTE): 0.2 10*3/uL (ref 0.0–0.4)
Eos: 2 %
Hematocrit: 44.1 % (ref 34.0–46.6)
Hemoglobin: 14 g/dL (ref 11.1–15.9)
Immature Grans (Abs): 0 10*3/uL (ref 0.0–0.1)
Immature Granulocytes: 0 %
Lymphocytes Absolute: 2.9 10*3/uL (ref 0.7–3.1)
Lymphs: 30 %
MCH: 26.6 pg (ref 26.6–33.0)
MCHC: 31.7 g/dL (ref 31.5–35.7)
MCV: 84 fL (ref 79–97)
Monocytes Absolute: 0.6 10*3/uL (ref 0.1–0.9)
Monocytes: 6 %
Neutrophils Absolute: 6 10*3/uL (ref 1.4–7.0)
Neutrophils: 61 %
Platelets: 442 10*3/uL (ref 150–450)
RBC: 5.26 x10E6/uL (ref 3.77–5.28)
RDW: 12.9 % (ref 11.7–15.4)
WBC: 9.7 10*3/uL (ref 3.4–10.8)

## 2023-04-16 LAB — STREP PNEUMONIAE 23 SEROTYPES IGG
Pneumo Ab Type 1*: 1.8 ug/mL (ref >1.3)
Pneumo Ab Type 12 (12F)*: 0.3 ug/mL — ABNORMAL LOW (ref >1.3)
Pneumo Ab Type 14*: 3.4 ug/mL (ref >1.3)
Pneumo Ab Type 17 (17F)*: 2.3 ug/mL (ref >1.3)
Pneumo Ab Type 19 (19F)*: 0.8 ug/mL — ABNORMAL LOW (ref >1.3)
Pneumo Ab Type 2*: 1.7 ug/mL (ref >1.3)
Pneumo Ab Type 20*: 15.7 ug/mL (ref >1.3)
Pneumo Ab Type 22 (22F)*: 4.2 ug/mL (ref >1.3)
Pneumo Ab Type 23 (23F)*: 10.7 ug/mL (ref >1.3)
Pneumo Ab Type 26 (6B)*: 3 ug/mL (ref >1.3)
Pneumo Ab Type 3*: 0.2 ug/mL — ABNORMAL LOW (ref >1.3)
Pneumo Ab Type 34 (10A)*: 2 ug/mL (ref >1.3)
Pneumo Ab Type 4*: 1.4 ug/mL (ref >1.3)
Pneumo Ab Type 43 (11A)*: 3.8 ug/mL (ref >1.3)
Pneumo Ab Type 5*: 0.2 ug/mL — ABNORMAL LOW (ref >1.3)
Pneumo Ab Type 51 (7F)*: 3 ug/mL (ref >1.3)
Pneumo Ab Type 54 (15B)*: 3.5 ug/mL (ref >1.3)
Pneumo Ab Type 56 (18C)*: 3.2 ug/mL (ref >1.3)
Pneumo Ab Type 57 (19A)*: 2.4 ug/mL (ref >1.3)
Pneumo Ab Type 68 (9V)*: 1 ug/mL — ABNORMAL LOW (ref >1.3)
Pneumo Ab Type 70 (33F)*: 1.7 ug/mL (ref >1.3)
Pneumo Ab Type 8*: 8.4 ug/mL (ref >1.3)
Pneumo Ab Type 9 (9N)*: 0.4 ug/mL — ABNORMAL LOW (ref >1.3)

## 2023-04-16 LAB — DIPHTHERIA / TETANUS ANTIBODY PANEL
Diphtheria Ab: 0.26 [IU]/mL (ref <0.10)
Tetanus Ab, IgG: 1.91 [IU]/mL (ref <0.10)

## 2023-04-16 LAB — C-REACTIVE PROTEIN: CRP: 4 mg/L (ref 0–10)

## 2023-04-16 LAB — IGG, IGA, IGM
IgA/Immunoglobulin A, Serum: 186 mg/dL (ref 87–352)
IgG (Immunoglobin G), Serum: 1094 mg/dL (ref 586–1602)
IgM (Immunoglobulin M), Srm: 246 mg/dL — ABNORMAL HIGH (ref 26–217)

## 2023-04-16 LAB — CHRONIC URTICARIA

## 2023-04-16 LAB — COMPLEMENT, TOTAL: Compl, Total (CH50): >60 U/mL (ref >41)

## 2023-04-16 LAB — TRYPTASE: Tryptase: 5.2 ug/L (ref 2.2–13.2)

## 2023-04-18 ENCOUNTER — Encounter: Payer: Self-pay | Admitting: Allergy & Immunology

## 2023-04-19 ENCOUNTER — Other Ambulatory Visit (HOSPITAL_BASED_OUTPATIENT_CLINIC_OR_DEPARTMENT_OTHER): Payer: Self-pay | Admitting: Obstetrics & Gynecology

## 2023-04-20 ENCOUNTER — Encounter: Payer: Self-pay | Admitting: Allergy & Immunology

## 2023-04-21 ENCOUNTER — Ambulatory Visit: Admitting: Internal Medicine

## 2023-04-21 ENCOUNTER — Other Ambulatory Visit (HOSPITAL_BASED_OUTPATIENT_CLINIC_OR_DEPARTMENT_OTHER): Payer: Self-pay

## 2023-04-21 ENCOUNTER — Other Ambulatory Visit: Payer: Self-pay

## 2023-04-21 MED ORDER — CLOBETASOL PROPIONATE 0.05 % EX SOLN
CUTANEOUS | 3 refills | Status: AC
  Filled 2023-04-21: qty 50, 30d supply, fill #0

## 2023-05-17 ENCOUNTER — Encounter: Payer: Self-pay | Admitting: Allergy & Immunology

## 2023-05-24 ENCOUNTER — Encounter: Payer: Self-pay | Admitting: Nurse Practitioner

## 2023-05-24 ENCOUNTER — Encounter: Payer: Self-pay | Admitting: Family

## 2023-05-24 ENCOUNTER — Ambulatory Visit: Payer: Self-pay

## 2023-05-24 ENCOUNTER — Ambulatory Visit: Admitting: Nurse Practitioner

## 2023-05-24 ENCOUNTER — Other Ambulatory Visit (HOSPITAL_BASED_OUTPATIENT_CLINIC_OR_DEPARTMENT_OTHER): Payer: Self-pay

## 2023-05-24 VITALS — BP 126/82 | HR 102 | Wt 228.2 lb

## 2023-05-24 DIAGNOSIS — G43809 Other migraine, not intractable, without status migrainosus: Secondary | ICD-10-CM

## 2023-05-24 DIAGNOSIS — R42 Dizziness and giddiness: Secondary | ICD-10-CM | POA: Diagnosis not present

## 2023-05-24 MED ORDER — MECLIZINE HCL 25 MG PO TABS
25.0000 mg | ORAL_TABLET | Freq: Three times a day (TID) | ORAL | 6 refills | Status: AC | PRN
Start: 2023-05-24 — End: ?
  Filled 2023-05-24: qty 60, 20d supply, fill #0

## 2023-05-24 MED ORDER — NURTEC 75 MG PO TBDP: ORAL_TABLET | ORAL | Status: DC

## 2023-05-24 NOTE — Telephone Encounter (Signed)
 Copied from CRM [id number]. Topic: Clinical - Red Word Triage >> May 24, 2023  8:34 AM Oddis Bench wrote: Red Word that prompted transfer to Nurse Triage: Patient is calling bc she has ear infection, she is stating that it started on Saturday night. She is having issue with equal librium feeling like she is going to pass out.   Chief Complaint: ears full Symptoms: dizziness Frequency: constant Pertinent Negatives: Patient denies URI symptoms Disposition: [] ED /[] Urgent Care (no appt availability in office) / [x] Appointment(In office/virtual)/ []  [facility]/ [] Home Care/ [] Refused Recommended Disposition /[] [facility]/ []  Follow-up with PCP Additional Notes: Pcp not avail until next month. Appt scheduled with Dr. Monnie Anthony today at 3:45pm  Reason for Disposition  Earache  (Exceptions: brief ear pain of < 60 minutes duration, earache occurring during air travel  Answer Assessment - Initial Assessment Questions 1. LOCATION: "Which ear is involved?"     Both ears, started on Right ear  2. ONSET: "When did the ear start hurting"      Saturday; 2 days ago  3. SEVERITY: "How bad is the pain?"  (Scale 1-10; mild, moderate or severe)   - MILD (1-3): doesn't interfere with normal activities    - MODERATE (4-7): interferes with normal activities or awakens from sleep    - SEVERE (8-10): excruciating pain, unable to do any normal activities      Mild, more pressure  4. URI SYMPTOMS: "Do you have a runny nose or cough?"     No  5. FEVER: "Do you have a fever?" If Yes, ask: "What is your temperature, how was it measured, and when did it start?"     No  6. CAUSE: "Have you been swimming recently?", "How often do you use Q-TIPS?", "Have you had any recent air travel or scuba diving?"     No, but endorses going to bed with hair wet  7. OTHER SYMPTOMS: "Do you have any other symptoms?" (e.g., headache, stiff neck, dizziness, vomiting, runny nose, decreased hearing)      Dizziness  8. PREGNANCY: "Is there any chance you are pregnant?" "When was your last menstrual period?"     LMP  Protocols used: Louie Rover

## 2023-05-24 NOTE — Progress Notes (Unsigned)
 Annella Kief, DNP, AGNP-c [facility] [address] [place], [place] [address] [phone]   ACUTE VISIT- ESTABLISHED PATIENT  Blood pressure 126/82, pulse (!) 102, weight 228 lb 3.2 oz (103.5 kg).  Subjective:  HPI Betty Leonard is a 43 y.o. female presents to day for evaluation of acute concern(s).   History of Present Illness Betty Leonard is a 43 year old female who presents with vertigo and ear pain.  She experienced a sudden onset of vertigo and a sensation of impending blackout while cooking dinner on Saturday night. This was accompanied by dyspnea, likened to having run a marathon, and pain on the left side of her head, initially thought to be an ear infection.  The following day, vertigo persisted with a sensation of her legs feeling weak, more akin to a migraine than an ear infection. She noted photophobia and bilateral ear pressure rather than pain. Despite sleeping the entire day on Sunday, which is unusual for her, she attributes this to exhaustion from a stressful work week with long hours and high-pressure meetings.  She reports a lack of appetite and a decrease in her usual caffeine intake, noting she did not want her usual coffee. She describes tinnitus and a 'hazy cloudness' over her eyes, with otherwise normal vision. No nausea, but she feels 'like I'm drunk' despite not having consumed alcohol in six months.  Movement of her head to the left worsens the vertigo, while moving it to the right alleviates it. She recently changed her protein shake routine to pre-made almond milk shakes but does not believe this is related to her symptoms.  She has a history of stress and lack of sleep, which she associates with her current symptoms, and describes a recent period of intense work stress culminating in a major presentation. No chest pain, nausea, or typical migraine headaches.No slurred speech or difficulty swallowing.   ROS negative except for what is  listed in HPI. History, Medications, Surgery, SDOH, and Family History reviewed and updated as appropriate.  Objective:  Physical Exam Vitals and nursing note reviewed.  Constitutional:      General: She is not in acute distress.    Appearance: Normal appearance. She is not ill-appearing.  HENT:     Head: Normocephalic.     Jaw: There is normal jaw occlusion.     Right Ear: Hearing, ear canal and external ear normal. A middle ear effusion is present.     Left Ear: Hearing and ear canal normal. A middle ear effusion is present.     Ears:     Comments: Clear effusion    Nose: Nose normal.     Right Sinus: No maxillary sinus tenderness or frontal sinus tenderness.     Left Sinus: No maxillary sinus tenderness or frontal sinus tenderness.     Mouth/Throat:     Lips: Pink.     Mouth: Mucous membranes are moist.  Eyes:     Extraocular Movements: Extraocular movements intact.     Conjunctiva/sclera: Conjunctivae normal.     Pupils: Pupils are equal, round, and reactive to light.  Neck:     Vascular: No carotid bruit.  Cardiovascular:     Rate and Rhythm: Normal rate and regular rhythm.     Pulses: Normal pulses.     Heart sounds: Normal heart sounds. No murmur heard. Pulmonary:     Effort: Pulmonary effort is normal.     Breath sounds: Normal breath sounds.  Abdominal:  General: Bowel sounds are normal.     Palpations: Abdomen is soft.  Musculoskeletal:     Cervical back: Normal range of motion and neck supple. No rigidity or tenderness.     Right lower leg: No edema.     Left lower leg: No edema.  Lymphadenopathy:     Cervical: No cervical adenopathy.  Skin:    General: Skin is warm and dry.     Capillary Refill: Capillary refill takes less than 2 seconds.  Neurological:     Mental Status: She is alert and oriented to person, place, and time.     Cranial Nerves: No cranial nerve deficit.     Sensory: No sensory deficit.     Motor: No weakness.     Coordination:  Coordination normal.     Gait: Gait normal.     Deep Tendon Reflexes: Reflexes normal.  Psychiatric:        Mood and Affect: Mood normal.        Behavior: Behavior normal.        Thought Content: Thought content normal.         Assessment & Plan:   Problem List Items Addressed This Visit     Vestibular migraine - Primary   Symptoms consistent with vestibular migraine, including vertigo, photophobia, and aural fullness, began suddenly while cooking and have persisted for 72 hours. No nystagmus observed. Reports a sensation of being in a fog with visual disturbances. Stress and sleep deprivation are potential triggers. Differential diagnosis considered inner ear issues, but fluid behind eardrums without infection and absence of nystagmus support vestibular migraine. No nausea or typical migraine headache, but headache exacerbated by photophobia. - Provide sample of Nurtec for acute treatment, to be taken after driving home. Sumatriptan and rizatriptan ineffective historically. Topamax, sumatriptan, and propranolol ineffective.  - Prescribe meclizine  for vertigo relief.  - Advise return for steroid or Toradol  injection if no improvement. - Provide educational materials on vestibular migraines.      Relevant Medications   meclizine  (ANTIVERT ) 25 MG tablet   Rimegepant Sulfate (NURTEC) 75 MG TBDP   Other Visit Diagnoses       Vertigo       Relevant Medications   Rimegepant Sulfate (NURTEC) 75 MG TBDP         Annella Kief, DNP, AGNP-c

## 2023-05-24 NOTE — Patient Instructions (Signed)
 Vertigo Vertigo is the feeling that you or the things around you are moving or spinning when they're not. It's different than feeling dizzy. It can also cause: Loss of balance. Trouble standing or walking. Nausea and vomiting. This feeling can come and go at any time. It can last from a few seconds to minutes or even hours. It may go away on its own or be treated with medicine. What are the types of vertigo? There are two types of vertigo: Peripheral vertigo happens when parts of your inner ear don't work like they should. This is the more common type. Central vertigo happens when your brain and spinal cord don't work like they should. Your health care provider will do tests to find out what kind of vertigo you have. This will help them decide on the right treatment for you. Follow these instructions at home: Eating and drinking Drink enough fluid to keep your pee (urine) pale yellow. Do not drink alcohol. Activity When you get up in the morning, first sit up on the side of the bed. When you feel okay, stand slowly while holding onto something. Move slowly. Avoid sudden body or head movements. Avoid certain positions, as told by your provider. Use a cane if you have trouble standing or walking. Sit down right away if you feel unsteady. Place items in your home so they're easy for you to reach without bending or leaning over. Return to normal activities when you're told. Ask what things are safe for you to do. General instructions Take your medicines only as told by your provider. Contact a health care provider if: Your medicines don't help or make your vertigo worse. You get new symptoms. You have a fever. You have nausea or vomiting. Your family or friends spot any changes in how you're acting. A part of your body goes numb. You feel tingling and prickling in a part of your body. You get very bad headaches. Get help right away if: You're always dizzy or you faint. You have a  stiff neck. You have trouble moving or speaking. Your hands, arms, or legs feel weak. Your hearing or eyesight changes. These symptoms may be an emergency. Call 911 right away. Do not wait to see if the symptoms will go away. Do not drive yourself to the hospital. This information is not intended to replace advice given to you by your health care provider. Make sure you discuss any questions you have with your health care provider. Document Revised: 10/08/2022 Document Reviewed: 04/10/2022 Elsevier Patient Education  2024 [employer].

## 2023-05-26 DIAGNOSIS — G43809 Other migraine, not intractable, without status migrainosus: Secondary | ICD-10-CM | POA: Insufficient documentation

## 2023-05-26 MED ORDER — NURTEC 75 MG PO TBDP
ORAL_TABLET | ORAL | 3 refills | Status: AC
  Filled 2023-05-26: qty 15, 30d supply, fill #0

## 2023-05-26 NOTE — Assessment & Plan Note (Addendum)
 Symptoms consistent with vestibular migraine, including vertigo, photophobia, and aural fullness, began suddenly while cooking and have persisted for 72 hours. No nystagmus observed. Reports a sensation of being in a fog with visual disturbances. Stress and sleep deprivation are potential triggers. Differential diagnosis considered inner ear issues, but fluid behind eardrums without infection and absence of nystagmus support vestibular migraine. No nausea or typical migraine headache, but headache exacerbated by photophobia. - Provide sample of Nurtec for acute treatment, to be taken after driving home. Sumatriptan and rizatriptan ineffective historically. Topamax, sumatriptan, and propranolol ineffective.  - Prescribe meclizine  for vertigo relief.  - Advise return for steroid or Toradol  injection if no improvement. - Provide educational materials on vestibular migraines.

## 2023-05-27 ENCOUNTER — Other Ambulatory Visit (HOSPITAL_BASED_OUTPATIENT_CLINIC_OR_DEPARTMENT_OTHER): Payer: Self-pay

## 2023-05-27 ENCOUNTER — Telehealth: Payer: Self-pay

## 2023-05-27 ENCOUNTER — Encounter: Payer: Self-pay | Admitting: Family

## 2023-05-27 ENCOUNTER — Other Ambulatory Visit (HOSPITAL_COMMUNITY): Payer: Self-pay

## 2023-05-27 NOTE — Telephone Encounter (Signed)
 Pharmacy Patient Advocate Encounter  Received an approval via our fax  Received notification from [facility] that Prior Authorization for Nurtec 75MG  dispersible tablets has been APPROVED from 4.8.25 to 5.8.26. Ran test claim, Copay is $RTS, RX WAS LAST FILLED ON 5.8.25. This test claim was processed through [facility]- copay amounts may vary at other pharmacies due to pharmacy/plan contracts, or as the patient moves through the different stages of their insurance plan.   PA #/Case ID/Reference #: (Key: [id number])

## 2023-05-28 ENCOUNTER — Other Ambulatory Visit (HOSPITAL_BASED_OUTPATIENT_CLINIC_OR_DEPARTMENT_OTHER): Payer: Self-pay

## 2023-06-01 ENCOUNTER — Other Ambulatory Visit: Payer: Self-pay

## 2023-06-01 ENCOUNTER — Encounter: Payer: Self-pay | Admitting: Allergy & Immunology

## 2023-06-01 ENCOUNTER — Ambulatory Visit (INDEPENDENT_AMBULATORY_CARE_PROVIDER_SITE_OTHER): Admitting: Allergy & Immunology

## 2023-06-01 ENCOUNTER — Other Ambulatory Visit (HOSPITAL_BASED_OUTPATIENT_CLINIC_OR_DEPARTMENT_OTHER): Payer: Self-pay

## 2023-06-01 VITALS — BP 120/72 | HR 90 | Temp 98.7°F | Resp 18 | Ht 65.75 in | Wt 228.1 lb

## 2023-06-01 DIAGNOSIS — D500 Iron deficiency anemia secondary to blood loss (chronic): Secondary | ICD-10-CM

## 2023-06-01 DIAGNOSIS — B999 Unspecified infectious disease: Secondary | ICD-10-CM

## 2023-06-01 DIAGNOSIS — J310 Chronic rhinitis: Secondary | ICD-10-CM | POA: Diagnosis not present

## 2023-06-01 DIAGNOSIS — L508 Other urticaria: Secondary | ICD-10-CM | POA: Diagnosis not present

## 2023-06-01 DIAGNOSIS — T7803XD Anaphylactic reaction due to other fish, subsequent encounter: Secondary | ICD-10-CM | POA: Diagnosis not present

## 2023-06-01 DIAGNOSIS — R52 Pain, unspecified: Secondary | ICD-10-CM

## 2023-06-01 MED ORDER — COLCHICINE 0.6 MG PO TABS
0.6000 mg | ORAL_TABLET | Freq: Every day | ORAL | 1 refills | Status: AC
  Filled 2023-06-01: qty 30, 30d supply, fill #0

## 2023-06-01 NOTE — Progress Notes (Unsigned)
 FOLLOW UP  Date of Service/Encounter:  06/01/23   Assessment:   Chronic urticaria    Seafood allergy  - with negative testing   Chronic rhinitis - with negative testing (consider intradermal testing at some point in the future)   Generalized pain   Recurrent infections    Iron  deficiency anemia   Vitamin D  deficiency   I am not entirely sure what is going on with Betty Leonard.  She has a whole host of symptoms and has a number of specialist, but no one's been able to pin down what exactly is happening.  We did use more research into NOD2 mutations.  I think her specific not to mutation that was tested on her genetic testing was associated with a mutation seen in Blau syndrome which is a disorder that leads to chronic granulomatous inflammatory conditions including polyarthritis, uveitis, and granulomatous dermatitis.  She certainly does not have this.  However, there is a disorder called Yao syndrome that is also caused by not to mutation.  Per review of the literature, it is treated with agents like colchicine and sulfasalazine, so we just can go ahead and try a round of colchicine to see if it does anything to change her trajectory.  We did discuss the GI side effects.  She does have some baseline labs and we will see her in 6 weeks to see how this is working.    Plan/Recommendations:   1. Chronic urticaria with generalized myalgia  - I wonder if this NOD2 mutation is associated with this urticaria disorder called YAO syndrome, but you do not fit that much at all. - Information provided on this.  - I am not convinced this this is what it is, but let's try colchicine 0.6mg  once daily (we will increase over time, but let's not increase until the next time).  2. Seafood allergy , anaphylaxis - Testing was negative.   3. Chronic rhinitis - Environmental allergy  testing was negative.  - Continue with Xyzal for now.  - We can schedule you for intradermal testing (which is more sensitive  than the blood work), but involves needles in the upper arm.   4. Recurrent infections - Workup has been negative.   5. Return in about 6 weeks (around 07/13/2023). You can have the follow up appointment with Dr. Idolina Maker or a Nurse Practicioner (our Nurse Practitioners are excellent and always have Physician oversight!).   Subjective:   Betty Leonard is a 43 y.o. 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   Low vitamin D  level 03/09/2023   Perimenopausal vasomotor symptoms 03/08/2023   Hot flashes 03/08/2023   Night sweats 03/08/2023   Chronic right hip pain 08/20/2022   Hand numbness 08/20/2022   Polyarthralgia 08/20/2022   Abnormal laboratory test result 08/20/2022   Vitamin D  deficiency 07/29/2022   SOBOE (shortness of breath on exertion) 07/29/2022   Other fatigue 07/29/2022   Other hyperlipidemia 07/29/2022   BMI 37.0-37.9, adult 07/29/2022   Generalized obesity with starting BMI 37.5 07/29/2022   Depression screen 07/29/2022   Chronic foot pain, right 07/02/2022   Eating disorder 07/02/2022   Injury of left hand 02/02/2022   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 Arvie Latus and GI 12/16/2011    History obtained from: chart review and patient.  Discussed the use of AI scribe software for clinical note transcription with the patient and/or guardian, who gave verbal  consent to proceed.  Betty Leonard is a 43 y.o. female presenting for a follow up visit.  She was last seen in March 2025.  At that time, she presented with chronic urticaria and a host of other complaints.  She also has a history of a seafood allergy , chronic rhinitis, and recurrent infections.  She had a very complicated history, so we obtained genetic testing.  Overall, her labs look really good.  Her immunoglobulins were all normal.  IgM was elevated.  Her streptococcal pneumonia titers were protective to 17 out of 24 serotypes which is excellent.  Her complement activity was normal, complete blood count was completely normal, CRP was normal. Her ESR was a little bit elevated. Tryptase was normal, ruling out a mast cell disease.  Her chronic urticaria panel was normal, ruling out autoimmune urticaria.  Her environmental allergy  testing was negative via the blood.  Seafood was also negative.   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  She also had a NOD2 mutation which we felt would have been seen earlier in life if that was relevant.  She experiences chronic pain affecting her joints, including her hips, knees, fingers, and feet. The pain is described as flu-like, with a constant, debilitating nature. She has seen multiple specialists, including rheumatology, but has not found relief. She has tried various medications, including Advil  Dual.  She experiences daily headaches and vertigo, which significantly affect her equilibrium. Last Saturday, she had a blackout episode while cooking dinner, attributed to a stressful week with long work hours and inadequate sleep. Following the blackout, she slept all day Sunday and felt unwell on Monday. Her primary care provider prescribed Nurtec, which she describes as a 'miracle drug' that helped her sleep well. However, the headaches have returned daily, accompanied by vertigo.  She  reports numbness and tingling in her hands and feet, which has been occurring more frequently. No fevers, but she describes feeling hot and cold frequently, akin to having symptoms of a fever without an actual temperature. She has a history of hormone panel testing due to concerns about early menopause following an ablation, but results showed low testosterone  or progesterone  levels without indicating the onset of menopause.  She has environmental allergies, which have been exacerbated by nearby construction, despite negative blood work for allergies. She is currently taking Xyzal for allergy  management. Her diet has been adjusted to avoid foods that make her feel sick, such as [place] food, spicy foods, and sugar. She relies heavily on caffeine to function, as she finds it preferable to taking Advil  daily.  She has a family history of Gaucher's disease but genetic testing revealed she is only a carrier. 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     Otherwise, there have been no changes to her past medical history,  surgical history, family history, or social history.    Review of systems otherwise negative other than that mentioned in the HPI.    Objective:   Blood pressure 120/72, pulse 90, temperature 98.7 F (37.1 C), temperature source Temporal, resp. rate 18, height 5' 5.75" (1.67 m), weight 228 lb 1.6 oz (103.5 kg), SpO2 96%. Body mass index is 37.1 kg/m.    Physical Exam Vitals reviewed.  Constitutional:      Appearance: She is well-developed.  HENT:     Head: Normocephalic and atraumatic.     Right Ear: Tympanic membrane, ear canal and external ear normal. No drainage, swelling or tenderness. Tympanic membrane is not injected, scarred, erythematous, retracted or bulging.     Left Ear: Tympanic membrane, ear canal and external ear normal. No drainage, swelling or tenderness. Tympanic membrane is not  injected, scarred, erythematous, retracted or bulging.     Nose: No nasal deformity, septal deviation, mucosal edema or rhinorrhea.     Right Turbinates: Enlarged, swollen and pale.     Left Turbinates: Enlarged, swollen and pale.     Right Sinus: No maxillary sinus tenderness or frontal sinus tenderness.     Left Sinus: No maxillary sinus tenderness or frontal sinus tenderness.     Comments: No polyps.    Mouth/Throat:     Lips: Pink.     Mouth: Mucous membranes are moist. Mucous membranes are not pale and not dry.     Pharynx: Uvula midline.     Comments: Mild cobblestoning. Eyes:     General:        Right eye: No discharge.        Left eye: No discharge.     Conjunctiva/sclera: Conjunctivae normal.     Right eye: Right conjunctiva is not injected. No chemosis.    Left eye: Left conjunctiva is not injected. No chemosis.    Pupils: Pupils are equal, round, and reactive to light.  Cardiovascular:     Rate and Rhythm: Normal rate and regular rhythm.     Heart sounds: Normal heart sounds.  Pulmonary:     Effort: Pulmonary effort is normal. No tachypnea, accessory muscle usage or respiratory distress.     Breath sounds: Normal breath sounds. No wheezing, rhonchi or rales.     Comments: No crackles or wheezes noted.  Chest:     Chest wall: No tenderness.  Abdominal:     Tenderness: There is no abdominal tenderness. There is no guarding or rebound.  Lymphadenopathy:     Head:     Right side of head: No submandibular, tonsillar or occipital adenopathy.     Left side of head: No submandibular, tonsillar or occipital adenopathy.     Cervical: No cervical adenopathy.  Skin:    Coloration: Skin is not pale.     Findings: No abrasion, erythema, petechiae or rash. Rash is not papular, urticarial or vesicular.  Neurological:     Mental Status: She is alert.  Psychiatric:        Behavior: Behavior is cooperative.      Diagnostic studies: none     Drexel Gentles, MD  [facility] 

## 2023-06-01 NOTE — Patient Instructions (Addendum)
 1. Chronic urticaria with generalized myalgia  - I wonder if this NOD2 mutation is associated with this urticaria disorder called YAO syndrome, but you do not fit that much at all. - Information provided on this.  - I am not convinced this this is what it is, but let's try colchicine 0.6mg  once daily (we will increase over time, but let's not increase until the next time).  2. Seafood allergy , anaphylaxis - Testing was negative.   3. Chronic rhinitis - Environmental allergy  testing was negative.  - Continue with Xyzal for now.  - We can schedule you for intradermal testing (which is more sensitive than the blood work), but involves needles in the upper arm.   4. Recurrent infections - Workup has been negative.   5. Return in about 6 weeks (around 07/13/2023). You can have the follow up appointment with Dr. Idolina Maker or a Nurse Practicioner (our Nurse Practitioners are excellent and always have Physician oversight!).    Please inform us  of any Emergency Department visits, hospitalizations, or changes in symptoms. Call us  before going to the ED for breathing or allergy  symptoms since we might be able to fit you in for a sick visit. Feel free to contact us  anytime with any questions, problems, or concerns.  It was a pleasure to meet you today! You're a hoot!   Websites that have reliable patient information: 1. American Academy of Asthma, Allergy , and Immunology: www.aaaai.org 2. Food Allergy  Research and Education (FARE): foodallergy.org 3. Mothers of Asthmatics: http://www.asthmacommunitynetwork.org 4. American College of Allergy , Asthma, and Immunology: www.acaai.org      "Like" us  on Facebook and Instagram for our latest updates!      A healthy democracy works best when [employer] participate! Make sure you are registered to vote! If you have moved or changed any of your contact information, you will need to get this updated before voting! Scan the QR codes below to learn more!

## 2023-06-02 ENCOUNTER — Encounter: Payer: Self-pay | Admitting: Allergy & Immunology

## 2023-06-02 DIAGNOSIS — R52 Pain, unspecified: Secondary | ICD-10-CM

## 2023-06-02 DIAGNOSIS — L508 Other urticaria: Secondary | ICD-10-CM

## 2023-06-03 ENCOUNTER — Encounter: Payer: Self-pay | Admitting: Allergy & Immunology

## 2023-06-04 NOTE — Addendum Note (Signed)
 Addended by: Rochester Chuck on: 06/04/2023 11:05 AM   Modules accepted: Orders

## 2023-06-10 ENCOUNTER — Telehealth: Payer: Self-pay | Admitting: Allergy & Immunology

## 2023-06-10 NOTE — Telephone Encounter (Signed)
 Betty Leonard has been referred to  [facility] [address] [place], [place] [address] [phone]   I have faxed the referral and all corresponding notes to their office.  They will reach out to the patient to schedule.  I will follow up in a week.

## 2023-06-11 NOTE — Telephone Encounter (Signed)
 Noted! Thank you

## 2023-06-23 ENCOUNTER — Encounter (HOSPITAL_BASED_OUTPATIENT_CLINIC_OR_DEPARTMENT_OTHER): Payer: Self-pay | Admitting: Student

## 2023-06-23 ENCOUNTER — Ambulatory Visit (HOSPITAL_BASED_OUTPATIENT_CLINIC_OR_DEPARTMENT_OTHER)

## 2023-06-23 ENCOUNTER — Ambulatory Visit (HOSPITAL_BASED_OUTPATIENT_CLINIC_OR_DEPARTMENT_OTHER): Admitting: Student

## 2023-06-23 ENCOUNTER — Encounter (HOSPITAL_BASED_OUTPATIENT_CLINIC_OR_DEPARTMENT_OTHER): Payer: Self-pay

## 2023-06-23 DIAGNOSIS — M79671 Pain in right foot: Secondary | ICD-10-CM | POA: Diagnosis not present

## 2023-06-23 DIAGNOSIS — S90121A Contusion of right lesser toe(s) without damage to nail, initial encounter: Secondary | ICD-10-CM

## 2023-06-23 NOTE — Progress Notes (Signed)
 Chief Complaint: Right foot pain     History of Present Illness:    Betty Leonard is a 43 y.o. female who presents today for evaluation of a right foot injury.  She reports that she stubbed her toe on a door a few weeks ago and this did seem to be improving.  However yesterday she kicked a concrete wall while at work and immediately developed pain in the pinky toe.  This has been noticeably bruised and painful particularly with weightbearing.  She did have some initial numbness and tingling however this has resolved.  Denies taking anything for pain but has been buddy taping.   Surgical History:   Right second and third metatarsal head resection  PMH/PSH/Family History/Social History/Meds/Allergies:    Past Medical History:  Diagnosis Date   Anemia    iron  infusion sept 2020   Anxiety    Chicken pox    Depression    Esophagitis 12/24/2011   Exhaustion    Falling    GERD (gastroesophageal reflux disease)    History of wheezing 06/06/2018   Joint pain    Menorrhagia    with irregular cycles   Migraines    Multiple food allergies    Numbness    hands and feet   Pneumonia 07/2018   Recurrent acute suppurative otitis media of right ear without spontaneous rupture of tympanic membrane 08/05/2021   Seasonal allergies    Sinusitis 11/12/2021   Torn muscle    right hip   UTI (urinary tract infection) finished antibiotic 04-11-2019   Vertigo 10/05/2021   Vitamin D  deficiency    Past Surgical History:  Procedure Laterality Date   ESOPHAGOGASTRODUODENOSCOPY  12/24/2011   Procedure: ESOPHAGOGASTRODUODENOSCOPY (EGD);  Surgeon: Claudette Cue, MD;  Location: [facility];  Service: Endoscopy;  Laterality: N/A;   FOOT SURGERY Right 2009   repair remotely after a fracture   FOOT SURGERY Right 1'2020   FOOT SURGERY Right 2023   HYSTEROSCOPY WITH NOVASURE N/A 04/25/2019   Procedure: HYSTEROSCOPY WITH NOVASURE ABLATION;  Surgeon: Lillian Rein,  MD;  Location: [facility];  Service: Gynecology;  Laterality: N/A;   KNEE ARTHROSCOPY Left 2018   Social History   Socioeconomic History   Marital status: Single    Spouse name: Not on file   Number of children: Not on file   Years of education: Not on file   Highest education level: Not on file  Occupational History   Not on file  Tobacco Use   Smoking status: Former    Current packs/day: 0.00    Average packs/day: 1 pack/day for 5.0 years (5.0 ttl pk-yrs)    Types: Cigarettes    Start date: 12/28/2013    Quit date: 12/29/2018    Years since quitting: 4.4    Passive exposure: Past   Smokeless tobacco: Never   Tobacco comments:    stopped on 12/29/2018  Vaping Use   Vaping status: Some Days  Substance and Sexual Activity   Alcohol use: Yes    Comment: socially   Drug use: No   Sexual activity: Not Currently    Birth control/protection: Surgical  Other Topics Concern   Not on file  Social History Narrative   Work or Holiday representative, [employer]      Home Situation: lives with roomate  Spiritual Beliefs: Christian      Lifestyle: CV exercise ( ) 3 days per week; diet is poor               Social Drivers of Corporate investment banker Strain: Not on file  Food Insecurity: No Food Insecurity (12/06/2019)   Hunger Vital Sign    Worried About Running Out of Food in the Last Year: Never true    Ran Out of Food in the Last Year: Never true  Transportation Needs: Not on file  Physical Activity: Not on file  Stress: Not on file  Social Connections: Unknown (05/18/2021)   Received from [employer], [facility]   Social Network    Social Network: Not on file   Family History  Problem Relation Age of Onset   Leukemia Paternal Grandfather    Lung cancer Paternal Grandfather    Arthritis Paternal Grandfather    Diabetes Paternal Grandfather    Heart disease Paternal Grandfather    Hypertension Paternal Grandfather    Kidney disease  Paternal Grandfather    Prostate cancer Paternal Grandfather    Gaucher's disease Paternal Grandfather    Sudden death Father        age 63, nothing found on autopsy   Diabetes Father    Hyperlipidemia Father    Hypertension Father    Mental illness Mother        bipolar, schizophrenia   Alcohol abuse Maternal Grandfather    Stroke Paternal Grandmother    Colon cancer Neg Hx    Allergies  Allergen Reactions   Codeine Shortness Of Breath   Hydrocodone  Shortness Of Breath   Iodine Other (See Comments)    Shellfish allergy    Shellfish Allergy  Anaphylaxis   Aspirin Hives   Current Outpatient Medications  Medication Sig Dispense Refill   Cholecalciferol  1.25 MG (50000 UT) capsule Take 1 capsule (50,000 Units total) by mouth once a week. 12 capsule 4   clobetasol  (TEMOVATE ) 0.05 % external solution Apply once to twice daily on scalp for 4 weeks as needed for flares (Patient not taking: Reported on 06/01/2023) 50 mL 3   colchicine  0.6 MG tablet Take 1 tablet (0.6 mg total) by mouth daily. 30 tablet 1   EPINEPHrine  (EPIPEN  2-PAK) 0.3 mg/0.3 mL IJ SOAJ injection Inject 0.3 mg into the muscle as needed for anaphylaxis. (Patient not taking: Reported on 06/01/2023) 2 each 1   EPINEPHrine  0.3 mg/0.3 mL IJ SOAJ injection Inject 0.3 mg into the muscle as needed for anaphylaxis. (Patient not taking: Reported on 05/24/2023)     levocetirizine (XYZAL) 5 MG tablet Take 5 mg by mouth every evening.     meclizine  (ANTIVERT ) 25 MG tablet Take 1 tablet (25 mg total) by mouth 3 (three) times daily as needed for dizziness. 60 tablet 6   Rimegepant Sulfate  (NURTEC) 75 MG TBDP Take 1 tablet as needed for symptoms of migraine. Do not take more than 1 dose in 24 hours. 15 tablet 3   No current facility-administered medications for this visit.   No results found.  Review of Systems:   A ROS was performed including pertinent positives and negatives as documented in the HPI.  Physical Exam :   Constitutional:  NAD and appears stated age Neurological: Alert and oriented Psych: Appropriate affect and cooperative There were no vitals taken for this visit.   Comprehensive Musculoskeletal Exam:    Ortho exam of the right fifth toe demonstrates presence of mild swelling and ecchymosis overlying the  PIP and proximal phalanx.  Toe is tender with palpation.  Flexion extensor mechanisms are intact.  Brisk capillary refill distally.  Dorsalis pedis pulse 2+.   Imaging:   Xray (right foot 3 views): No acute fracture or dislocation is visualized.  Postsurgical changes including a well appearing hardware within the first metatarsal and prior 2nd and 3rd metatarsal head resection    I personally reviewed and interpreted the radiographs.   Assessment:   43 y.o. female with acute pain of the right foot, particularly of the fifth toe.  She had an injury a few weeks ago which was recovering however she had a reinjury yesterday after kicking a concrete wall.  X-rays today do not appear to show any evidence of an acute fracture.  She does have some focal ecchymosis over the fifth toe on exam, so this is most likely consistent with a contusion.  Recommend continuing with buddy taping and Coban was provided today.  She can also utilize stiff soled footwear for added support.  Plan to have her return as needed.  Plan :    - Return to clinic as needed     I personally saw and evaluated the patient, and participated in the management and treatment plan.  Sharrell Deck, PA-C Orthopedics  This document was dictated using Conservation officer, historic buildings. A reasonable attempt at proof reading has been made to minimize errors.

## 2023-06-28 NOTE — Telephone Encounter (Signed)
 Betty Leonard sent a MyChart message stating she is scheduled with [facility] on 07/12/2023.

## 2023-07-08 ENCOUNTER — Ambulatory Visit: Admitting: Allergy & Immunology

## 2023-07-12 ENCOUNTER — Encounter: Payer: Self-pay | Admitting: Allergy & Immunology

## 2023-07-28 ENCOUNTER — Ambulatory Visit (HOSPITAL_BASED_OUTPATIENT_CLINIC_OR_DEPARTMENT_OTHER): Admitting: Obstetrics & Gynecology

## 2023-09-27 ENCOUNTER — Other Ambulatory Visit (HOSPITAL_BASED_OUTPATIENT_CLINIC_OR_DEPARTMENT_OTHER): Payer: Self-pay

## 2023-09-27 MED ORDER — MELOXICAM 7.5 MG PO TABS
7.5000 mg | ORAL_TABLET | Freq: Two times a day (BID) | ORAL | 0 refills | Status: AC
  Filled 2023-09-27: qty 30, 30d supply, fill #0

## 2023-11-16 ENCOUNTER — Other Ambulatory Visit (HOSPITAL_BASED_OUTPATIENT_CLINIC_OR_DEPARTMENT_OTHER): Payer: Self-pay

## 2023-11-16 MED ORDER — HYDROMORPHONE HCL 2 MG PO TABS
2.0000 mg | ORAL_TABLET | Freq: Four times a day (QID) | ORAL | 0 refills | Status: AC
  Filled 2023-11-16: qty 10, 3d supply, fill #0

## 2023-11-16 MED ORDER — ONDANSETRON 4 MG PO TBDP
4.0000 mg | ORAL_TABLET | Freq: Four times a day (QID) | ORAL | 0 refills | Status: AC
  Filled 2023-11-16: qty 9, 30d supply, fill #0

## 2023-11-17 ENCOUNTER — Other Ambulatory Visit (HOSPITAL_BASED_OUTPATIENT_CLINIC_OR_DEPARTMENT_OTHER): Payer: Self-pay

## 2023-11-17 MED ORDER — KETOROLAC TROMETHAMINE 10 MG PO TABS
10.0000 mg | ORAL_TABLET | Freq: Four times a day (QID) | ORAL | 0 refills | Status: AC
  Filled 2023-11-17: qty 20, 5d supply, fill #0

## 2023-11-22 ENCOUNTER — Encounter: Payer: Self-pay | Admitting: Radiology

## 2024-01-17 ENCOUNTER — Other Ambulatory Visit (HOSPITAL_BASED_OUTPATIENT_CLINIC_OR_DEPARTMENT_OTHER): Payer: Self-pay

## 2024-01-17 MED ORDER — OSELTAMIVIR PHOSPHATE 75 MG PO CAPS
75.0000 mg | ORAL_CAPSULE | Freq: Two times a day (BID) | ORAL | 0 refills | Status: AC
  Filled 2024-01-17: qty 10, 5d supply, fill #0

## 2024-01-25 ENCOUNTER — Telehealth: Payer: Self-pay

## 2024-01-25 NOTE — Telephone Encounter (Signed)
 Pt. Will need an appt. For evaluation can be virtual.    Copied from CRM [id number]. Topic: Clinical - Medication Question >> Jan 25, 2024  8:31 AM Myrick T wrote: Reason for CRM: patient called stated he dog passed away and she has not slept in 2 weeks leading up to his passing. Patient needs a medication for her nerves and that will help her sleep. She did not want to see any other provider other than Camie Hun Early who had no appts before mid Feb. Please f/u with patient   ----------------------------------------------------------------------- From previous Reason for Contact - Scheduling: Patient/patient representative is calling to schedule an appointment. Refer to attachments for appointment information.

## 2024-01-25 NOTE — Telephone Encounter (Signed)
 Spoke to patient and she asked if I would ask you if there is just something you can take over the counter just to help her sleep. She states she doesn't like to take any meds at all and you know this. She doesn't want narcotics or anything addicting just something to help her sleep  She has had people recommend stuff but wanted to know from you

## 2024-01-31 ENCOUNTER — Encounter (INDEPENDENT_AMBULATORY_CARE_PROVIDER_SITE_OTHER): Payer: Self-pay
